# Patient Record
Sex: Female | Born: 1940 | Race: White | Hispanic: No | Smoking: Never smoker
Health system: Southern US, Community
[De-identification: ages and names within clinical notes are randomized; demographics above are authoritative.]

## PROBLEM LIST (undated history)

## (undated) DIAGNOSIS — I10 Essential (primary) hypertension: Secondary | ICD-10-CM

## (undated) DIAGNOSIS — F329 Major depressive disorder, single episode, unspecified: Secondary | ICD-10-CM

## (undated) DIAGNOSIS — R053 Chronic cough: Secondary | ICD-10-CM

## (undated) DIAGNOSIS — K219 Gastro-esophageal reflux disease without esophagitis: Secondary | ICD-10-CM

## (undated) DIAGNOSIS — F419 Anxiety disorder, unspecified: Secondary | ICD-10-CM

## (undated) DIAGNOSIS — T8859XA Other complications of anesthesia, initial encounter: Secondary | ICD-10-CM

## (undated) DIAGNOSIS — N1832 Chronic kidney disease, stage 3b: Secondary | ICD-10-CM

## (undated) DIAGNOSIS — Z9989 Dependence on other enabling machines and devices: Secondary | ICD-10-CM

## (undated) DIAGNOSIS — Z974 Presence of external hearing-aid: Secondary | ICD-10-CM

## (undated) DIAGNOSIS — R0602 Shortness of breath: Secondary | ICD-10-CM

## (undated) DIAGNOSIS — F32A Depression, unspecified: Secondary | ICD-10-CM

## (undated) DIAGNOSIS — U071 COVID-19: Secondary | ICD-10-CM

## (undated) DIAGNOSIS — I82409 Acute embolism and thrombosis of unspecified deep veins of unspecified lower extremity: Secondary | ICD-10-CM

## (undated) HISTORY — DX: Major depressive disorder, single episode, unspecified: F32.9

## (undated) HISTORY — DX: Essential (primary) hypertension: I10

## (undated) HISTORY — PX: PARATHYROIDECTOMY: SHX19

## (undated) HISTORY — DX: Anxiety disorder, unspecified: F41.9

## (undated) HISTORY — PX: APPENDECTOMY: SHX54

## (undated) HISTORY — DX: Depression, unspecified: F32.A

---

## 1998-01-13 ENCOUNTER — Ambulatory Visit (HOSPITAL_COMMUNITY): Admission: RE | Admit: 1998-01-13 | Discharge: 1998-01-13 | Payer: Self-pay | Admitting: *Deleted

## 1998-01-20 ENCOUNTER — Ambulatory Visit (HOSPITAL_COMMUNITY): Admission: RE | Admit: 1998-01-20 | Discharge: 1998-01-20 | Payer: Self-pay | Admitting: *Deleted

## 1999-02-06 ENCOUNTER — Ambulatory Visit (HOSPITAL_COMMUNITY): Admission: RE | Admit: 1999-02-06 | Discharge: 1999-02-06 | Payer: Self-pay | Admitting: *Deleted

## 1999-02-06 ENCOUNTER — Encounter: Payer: Self-pay | Admitting: Unknown Physician Specialty

## 1999-05-03 ENCOUNTER — Encounter: Admission: RE | Admit: 1999-05-03 | Discharge: 1999-06-21 | Payer: Self-pay | Admitting: Unknown Physician Specialty

## 1999-05-03 ENCOUNTER — Encounter: Payer: Self-pay | Admitting: Unknown Physician Specialty

## 1999-05-03 ENCOUNTER — Encounter: Admission: RE | Admit: 1999-05-03 | Discharge: 1999-05-03 | Payer: Self-pay | Admitting: Unknown Physician Specialty

## 1999-07-12 ENCOUNTER — Encounter: Admission: RE | Admit: 1999-07-12 | Discharge: 1999-07-12 | Payer: Self-pay | Admitting: Unknown Physician Specialty

## 2000-02-13 ENCOUNTER — Encounter: Payer: Self-pay | Admitting: Unknown Physician Specialty

## 2000-02-13 ENCOUNTER — Ambulatory Visit (HOSPITAL_COMMUNITY): Admission: RE | Admit: 2000-02-13 | Discharge: 2000-02-13 | Payer: Self-pay | Admitting: Unknown Physician Specialty

## 2001-04-15 ENCOUNTER — Encounter: Payer: Self-pay | Admitting: Unknown Physician Specialty

## 2001-04-15 ENCOUNTER — Ambulatory Visit (HOSPITAL_COMMUNITY): Admission: RE | Admit: 2001-04-15 | Discharge: 2001-04-15 | Payer: Self-pay | Admitting: Unknown Physician Specialty

## 2001-11-13 ENCOUNTER — Emergency Department (HOSPITAL_COMMUNITY): Admission: EM | Admit: 2001-11-13 | Discharge: 2001-11-13 | Payer: Self-pay | Admitting: Emergency Medicine

## 2001-11-13 ENCOUNTER — Encounter: Payer: Self-pay | Admitting: Emergency Medicine

## 2002-04-28 ENCOUNTER — Ambulatory Visit (HOSPITAL_COMMUNITY): Admission: RE | Admit: 2002-04-28 | Discharge: 2002-04-28 | Payer: Self-pay | Admitting: Unknown Physician Specialty

## 2002-04-28 ENCOUNTER — Encounter: Payer: Self-pay | Admitting: Unknown Physician Specialty

## 2003-10-05 ENCOUNTER — Ambulatory Visit (HOSPITAL_COMMUNITY): Admission: RE | Admit: 2003-10-05 | Discharge: 2003-10-05 | Payer: Self-pay | Admitting: Unknown Physician Specialty

## 2003-12-30 ENCOUNTER — Emergency Department (HOSPITAL_COMMUNITY): Admission: EM | Admit: 2003-12-30 | Discharge: 2003-12-30 | Payer: Self-pay | Admitting: Family Medicine

## 2004-06-13 ENCOUNTER — Emergency Department: Payer: Self-pay | Admitting: Emergency Medicine

## 2004-10-24 ENCOUNTER — Ambulatory Visit (HOSPITAL_COMMUNITY): Admission: RE | Admit: 2004-10-24 | Discharge: 2004-10-24 | Payer: Self-pay | Admitting: Unknown Physician Specialty

## 2005-04-18 ENCOUNTER — Ambulatory Visit: Payer: Self-pay | Admitting: Unknown Physician Specialty

## 2006-02-05 ENCOUNTER — Ambulatory Visit: Payer: Self-pay | Admitting: Unknown Physician Specialty

## 2007-02-12 ENCOUNTER — Ambulatory Visit: Payer: Self-pay | Admitting: Unknown Physician Specialty

## 2007-06-23 ENCOUNTER — Ambulatory Visit: Payer: Self-pay | Admitting: Psychiatry

## 2007-10-01 ENCOUNTER — Ambulatory Visit: Payer: Self-pay | Admitting: Psychiatry

## 2007-10-15 ENCOUNTER — Ambulatory Visit: Payer: Self-pay | Admitting: Psychiatry

## 2008-02-13 ENCOUNTER — Ambulatory Visit: Payer: Self-pay | Admitting: Unknown Physician Specialty

## 2009-09-06 ENCOUNTER — Emergency Department: Payer: Self-pay | Admitting: Emergency Medicine

## 2009-09-07 ENCOUNTER — Ambulatory Visit: Payer: Self-pay | Admitting: Emergency Medicine

## 2010-01-24 ENCOUNTER — Emergency Department: Payer: Self-pay | Admitting: Emergency Medicine

## 2010-04-12 ENCOUNTER — Ambulatory Visit: Payer: Self-pay | Admitting: Unknown Physician Specialty

## 2010-08-07 ENCOUNTER — Ambulatory Visit: Payer: Self-pay | Admitting: Unknown Physician Specialty

## 2010-08-09 LAB — PATHOLOGY REPORT

## 2010-08-30 ENCOUNTER — Ambulatory Visit: Payer: Self-pay | Admitting: Psychiatry

## 2011-10-01 ENCOUNTER — Ambulatory Visit: Payer: Self-pay | Admitting: Unknown Physician Specialty

## 2012-01-14 ENCOUNTER — Emergency Department: Payer: Self-pay | Admitting: Emergency Medicine

## 2012-02-01 ENCOUNTER — Ambulatory Visit: Payer: Self-pay | Admitting: Internal Medicine

## 2012-03-19 ENCOUNTER — Encounter: Payer: Self-pay | Admitting: Physician Assistant

## 2012-04-08 ENCOUNTER — Encounter: Payer: Self-pay | Admitting: Physician Assistant

## 2012-05-09 ENCOUNTER — Encounter: Payer: Self-pay | Admitting: Physician Assistant

## 2012-11-11 ENCOUNTER — Inpatient Hospital Stay: Payer: Self-pay | Admitting: Psychiatry

## 2012-11-11 DIAGNOSIS — Z01810 Encounter for preprocedural cardiovascular examination: Secondary | ICD-10-CM

## 2012-11-11 LAB — BEHAVIORAL MEDICINE 1 PANEL
Anion Gap: 6 — ABNORMAL LOW (ref 7–16)
BUN: 18 mg/dL (ref 7–18)
Chloride: 108 mmol/L — ABNORMAL HIGH (ref 98–107)
Co2: 26 mmol/L (ref 21–32)
EGFR (African American): 53 — ABNORMAL LOW
EGFR (Non-African Amer.): 46 — ABNORMAL LOW
Eosinophil %: 1.7 %
Glucose: 93 mg/dL (ref 65–99)
HCT: 40.7 % (ref 35.0–47.0)
Lymphocyte #: 1.9 10*3/uL (ref 1.0–3.6)
MCH: 31.9 pg (ref 26.0–34.0)
Monocyte %: 11.8 %
Neutrophil %: 58.6 %
Osmolality: 281 (ref 275–301)
Potassium: 4.5 mmol/L (ref 3.5–5.1)
RBC: 4.39 10*6/uL (ref 3.80–5.20)

## 2012-11-12 ENCOUNTER — Ambulatory Visit: Payer: Self-pay | Admitting: Psychiatry

## 2012-11-12 LAB — URINALYSIS, COMPLETE
Blood: NEGATIVE
Ketone: NEGATIVE
Nitrite: NEGATIVE
Ph: 5 (ref 4.5–8.0)
Specific Gravity: 1.008 (ref 1.003–1.030)

## 2012-11-24 ENCOUNTER — Emergency Department: Payer: Self-pay | Admitting: Emergency Medicine

## 2012-11-24 LAB — ETHANOL
Ethanol %: <0.003 % (ref 0.000–0.080)
Ethanol: <3 mg/dL

## 2012-11-24 LAB — CBC
HCT: 45 % (ref 35.0–47.0)
HGB: 15.1 g/dL (ref 12.0–16.0)
MCH: 31.5 pg (ref 26.0–34.0)
MCHC: 33.6 g/dL (ref 32.0–36.0)
MCV: 94 fL (ref 80–100)
RDW: 14.6 % — ABNORMAL HIGH (ref 11.5–14.5)
WBC: 9.1 10*3/uL (ref 3.6–11.0)

## 2012-11-24 LAB — DRUG SCREEN, URINE
Amphetamines, Ur Screen: NEGATIVE (ref ?–1000)
Barbiturates, Ur Screen: NEGATIVE (ref ?–200)
Cannabinoid 50 Ng, Ur ~~LOC~~: NEGATIVE (ref ?–50)
Cocaine Metabolite,Ur ~~LOC~~: NEGATIVE (ref ?–300)
Methadone, Ur Screen: NEGATIVE (ref ?–300)
Opiate, Ur Screen: NEGATIVE (ref ?–300)

## 2012-11-24 LAB — URINALYSIS, COMPLETE
Bilirubin,UR: NEGATIVE
Ph: 5 (ref 4.5–8.0)
Protein: NEGATIVE
Squamous Epithelial: 3
WBC UR: 3 /HPF (ref 0–5)

## 2012-11-24 LAB — COMPREHENSIVE METABOLIC PANEL
Anion Gap: 12 (ref 7–16)
BUN: 18 mg/dL (ref 7–18)
Bilirubin,Total: 0.4 mg/dL (ref 0.2–1.0)
EGFR (African American): 56 — ABNORMAL LOW
EGFR (Non-African Amer.): 48 — ABNORMAL LOW
Osmolality: 280 (ref 275–301)
Potassium: 3.7 mmol/L (ref 3.5–5.1)
SGPT (ALT): 70 U/L (ref 12–78)
Total Protein: 7.5 g/dL (ref 6.4–8.2)

## 2012-12-07 ENCOUNTER — Ambulatory Visit: Payer: Self-pay | Admitting: Psychiatry

## 2013-03-04 ENCOUNTER — Ambulatory Visit: Payer: Self-pay | Admitting: Psychiatry

## 2014-07-15 ENCOUNTER — Ambulatory Visit: Payer: Self-pay | Admitting: Physical Medicine and Rehabilitation

## 2014-10-22 ENCOUNTER — Other Ambulatory Visit: Payer: Self-pay | Admitting: Specialist

## 2014-10-22 DIAGNOSIS — R918 Other nonspecific abnormal finding of lung field: Secondary | ICD-10-CM

## 2014-10-29 NOTE — Discharge Summary (Signed)
PATIENT NAME:  Kara Pennington, Kara S MR#:  [id number] DATE OF BIRTH:  1940/08/07  DATE OF ADMISSION:  11/11/2012 DATE OF DISCHARGE:  11/12/2012  HOSPITAL COURSE: See dictated history and physical for details of admission. This 74 year old woman was admitted to the hospital for planned overnight stay to initiate electroconvulsive therapy. The patient had a history of major depression with progressively poor functioning outside the hospital. In the hospital, she did not engage in any dangerous or aggressive behavior. She was cooperative with treatment. The lab tests were obtained for initiation of ECT without any difficulty. ECT was begun on the morning of May 7 and she received a right unilateral treatment, which she tolerated well without any difficulties or complications. Recovered well. Did not have any complaints other than mild transient headache. The patient was discharged home in the company of her children with a plan to return for continued index course of ECT for the following Friday and will continue follow-up psychiatric care with her outpatient psychiatrist, otherwise.   MENTAL STATUS EXAMINATION AT DISCHARGE: Neatly dressed and groomed woman, looks her stated age, cooperative with the interview. Good eye contact, normal psychomotor activity. Speech normal in rate, tone and volume. Affect euthymic, reactive and appropriate. Mood stated as being okay. Thoughts appear to be lucid. No evidence of loosening of associations or delusions. Denies auditory or visual hallucinations. Denies suicidal or homicidal ideation. Shows good judgment and insight. No obvious changes to memory.   LABORATORY RESULTS: EKG shows sinus rhythm with some variation, possible left atrial enlargement. No sign of ischemia.   Chest x-ray unremarkable.   CBC normal.  Chemistry panel showed only minor abnormality with elevated chloride at 108, AST at 58, albumin 3.2.   Urinalysis unremarkable.   DISCHARGE MEDICATIONS: Fetzima  40 mg once a day, Sonata 10 mg once at night, oxycodone/acetaminophen 5/325, 1 tablet q. 4 hours p.r.n. for pain.   DISPOSITION: Discharge home, as noted to her home. She will be accompanied by her children and she has her children to check up on her and friends who will apparently assist her with outpatient ECT.   DIAGNOSIS, PRINCIPAL AND PRIMARY:  AXIS I: Major depression, severe, recurrent.   SECONDARY DIAGNOSES: AXIS I: No further.   AXIS II: No diagnosis.   AXIS III: Chronic pain.   AXIS IV: Moderate to severe from burden of illness.   AXIS V: Functioning at time of discharge 55.  ____________________________ Audery AmelJohn T. Donivin Wirt, MD jtc:cc D: 11/16/2012 22:51:50 ET T: 11/17/2012 12:19:26 ET JOB#: [id number]  cc: Audery AmelJohn T. Aashna Matson, MD, <Dictator> Audery AmelJOHN T Verdie Wilms MD ELECTRONICALLY SIGNED 11/17/2012 17:36

## 2014-10-29 NOTE — Consult Note (Signed)
PATIENT NAME:  Kara Pennington, Kara Pennington MR#:  [id number] DATE OF BIRTH:  Apr 28, 1941  DATE OF CONSULTATION:  11/25/2012  PSYCHIATRIC CONSULT  REFERRING PHYSICIAN:   Maricela Bo, MD CONSULTING PHYSICIAN:  Ardeen Fillers. Garnetta Buddy, MD  REASON FOR CONSULTATION:  "I was acting spacey."  HISTORY OF PRESENT ILLNESS: The patient is a 74 year old Caucasian white female who was brought to the ED with her daughter for having confusion. The patient has a long history of depression, memory impairment and she was looking spacey. She was accompanied by her family members, including her daughter and her son. Her daughter reported that the patient had 5 ECT treatments and she was supposed to have one yesterday morning, but she forgot and when her son went to get her for the treatment, she actually drank some orange juice. The patient reported that she forgot about the treatment and has been sleeping a lot. The daughter reported that patient has a delayed reaction to things and she appeared confused with poor judgment. The daughter has been asking the patient not to drive on Saturday and then she got into her car and drove to see the grandson play basketball. The patient has been making excuses for her impaired memory.   The patient has started her ECT treatment recently and has received 4 treatments so far and she is currently under the treatment of Dr. Toni Amend and getting outpatient ECT treatments. During my interview, the patient was lying in the bed and she appeared calm and cooperative. She reported that she has long history of depression and she has noticed some improvement in her depressive symptoms at this time. She reported that she has noticed some amnesia related to the ECT treatments and her family is more concerned, as she forgot her 5th ECT treatment. Reported that she is not having any other adverse effects besides having some short-term amnesia. She denied having any auditory or visual hallucinations. Denied having any  suicidal ideations or plans.   PAST PSYCHIATRIC HISTORY: The patient reported that she is currently under the treatment of Dr. Imogene Burn for the past 4 to 5 years. Dr. Toni Amend is doing ECT on her at this time. Reported that she is compliant with her medications, as well as with her treatment. Reported that she is taking Viibyrid 10 mg at this time.   SOCIAL HISTORY: The patient currently lives by herself. She stated that she also takes zaleplon 10 mg to help with her sleep.   ALLERGIES:  No known drug allergies.   MEDICAL HISTORY:  Hypertension.  REVIEW OF SYSTEMS:  CONSTITUTIONAL:  No fever or chills. No weight changes. Denies any blurred vision.  RESPIRATORY:  No shortness of breath or cough.  CARDIOVASCULAR:  Denies any chest pain or orthopnea.  GASTROINTESTINAL:  No abdominal pain, nausea, vomiting or diarrhea.  ENDOCRINE:  No heat or cold intolerance. LYMPHATIC:  No anemia or easy bruising.  INTEGUMENTARY:  No acne or rash.  MUSCULOSKELETAL:  Denies any muscle or joint pain.  VITAL SIGNS: Temperature 98.3, pulse 84, respirations 18, blood pressure 163/89.  Glucose 111, BUN 18, creatinine 1.13, sodium 139, potassium 3.7, chloride 109, bicarbonate 18, anion gap 12, osmolality 280. Blood alcohol level less than 3.0. Protein 7.5, albumin 3.5, bilirubin 0.4, alkaline phosphatase 125, AST 66, ALT 70. Urine drug screen was negative. TSH 4.87. WBC 9.1, RBC 84.81, hemoglobin 15.1, hematocrit 45, platelet count 197, MCV 94, RDW 14.6.   MENTAL STATUS:  The patient is an older-looking female who appeared her stated age.  She was calm and cooperative. She maintained fair eye contact. Her mood was fine. Affect was congruent. Thought process logical, goal-directed. Thought content was nondelusional. She appeared somewhat depressed, but denied having any suicidal ideation or plan. She demonstrated fair insight and judgment.   DIAGNOSTIC IMPRESSION: AXIS I:  Major depressive disorder, recurrent, severe  without psychotic features.   AXIS II:  None.   AXIS III:  Hypertension.   TREATMENT PLAN:  The patient will be discharged from the Emergency Room as she does not have any suicidal ideations or plan. She contracted for safety. She will return back to her home with her family members and she will continue with her ECT treatment as advised by Dr. Toni Amendlapacs. The patient agreed with the plan. No new medications will be prescribed for her at this time. I advised patient to return to the Emergency Room if she noticed worsening of her symptoms and she demonstrated understanding.  Thank you for allowing me to participate in the care of this patient.     ____________________________ Ardeen FillersUzma S. Garnetta BuddyFaheem, MD usf:ce D: 11/25/2012 13:17:50 ET T: 11/25/2012 13:33:21 ET JOB#: [id number]  cc: Ardeen FillersUzma S. Garnetta BuddyFaheem, MD, <Dictator> Rhunette CroftUZMA S Jakim Drapeau MD ELECTRONICALLY SIGNED 11/25/2012 16:41

## 2014-10-29 NOTE — H&P (Signed)
PATIENT NAME:  Kara Pennington, Kara Pennington MR#:  [id number] DATE OF BIRTH:  10/11/1940  DATE OF ADMISSION:  11/11/2012  IDENTIFYING INFORMATION AND CHIEF COMPLAINT: The patient is a 74 year old woman with a history of major depression admitted voluntarily for ECT.   CHIEF COMPLAINT: "I just want to get better."   HISTORY OF PRESENT ILLNESS: Information obtained from the patient and some old records from her primary care doctor. The patient reports that she has been having major depression episodes that have been going on for years, but have been particularly bad for the last couple years. She currently complains of sad mood most of the time. She has lost interest in almost all of her normal life activities. Her energy level feels low. She often does not feel like getting out of bed at all. She has frequent crying spells. She sleeps poorly at night. She has had some weight loss. At times she has had passive suicidal thoughts with thoughts of wishing that she would just die. She has not actually acted on them. She has not had any hallucinations. She is compliant with her current psychiatric medicine. She has been taking her current dose of antidepressant medicine for a couple of months without significant improvement. She currently is an outpatient seeing Dr. [place]velene [place]Kaur in [place].   PAST PSYCHIATRIC HISTORY: No previous psychiatric hospitalizations. No history of prior suicide attempts or psychotic episodes. No history of manic episodes. Long history of depression going back years intermittently. Has been on multiple medications including multiple serotonin reuptake inhibitors and tricyclic antidepressants without significant benefit. Has also been on antipsychotic medicines as boosters without significant lasting benefit. In the past, she had some improvement with Zoloft but that no longer worked and her current antidepressant has been only partially helpful.   PAST MEDICAL HISTORY: The patient has a very benign past  medical history with no significant ongoing medical problems. No history of high blood pressure. No history of heart disease. No history of strokes or seizures or intracranial lesions.   SOCIAL HISTORY: The patient lives alone, but has multiple supportive family members including at least 2 supportive children who live in the area. She is retired. At her previous baseline, she was very socially active. Does not report any immediate clear stress related to her family.   SUBSTANCE ABUSE HISTORY: Drinks very infrequently. No history of abuse of drinking. No history of any drug abuse.   MEDICATIONS:  1.  Fetzima 40 mg p.o. at bedtime. 2.  Sonata 10 mg p.o. at bedtime.   ALLERGIES: No known drug allergies.   REVIEW OF SYSTEMS: As noted above, constant depressed mood, low interest in normal activities, low energy, passive suicidal ideation, poor sleep most nights, hopelessness, crying spells. No hallucinations or delusions.   MENTAL STATUS EXAMINATION: Neatly dressed and groomed woman who looks her stated age. Cooperative with the interview. Good eye contact. Slow psychomotor activity. Slow and quiet speech. Affect blunted. Mood stated as depressed. Thoughts appear to generally be lucid. No obvious loosening of associations or delusions. Denies auditory or visual hallucinations. Passive suicidal thoughts with hopelessness, but no intention of acting on it. Is able to identify positive things in her life including her children and grandchildren not to kill herself for. No homicidal ideation. No evidence of thought disorder. Normal intelligence, reasonably good insight and judgment.   PHYSICAL EXAMINATION: GENERAL: The patient appears in no acute distress.  SKIN: No acute skin lesions identified.  HEENT: Pupils equal and reactive. Face symmetric. Oral mucosa normal.  BACK AND NECK: Nontender. MUSCULOSKELETAL: Full range of motion at all extremities.  LUNGS: Clear without wheezes.  HEART: Regular rate  and rhythm.  ABDOMEN: Soft, nontender.  Normal bowel sounds.  NEUROLOGIC:  Strength and reflexes normal and symmetric throughout. Cranial nerves symmetric and normal.  VITAL SIGNS: Not yet done and will be entered when they are.   LABORATORY RESULTS: Labs are not yet done and will be entered when they are.   ASSESSMENT: A 74 year old woman admitted voluntarily for ECT. The patient has severe depression without response to medicine. She lives on her own. She has had some passive suicidal ideation. Additionally, she is at elevated risk for delirium and confusion related to ECT treatment. Because of this it is prudent to admit her to the hospital at least overnight to begin ECT. Plan is to begin ECT voluntarily tomorrow. Full lab studies will be done in the meantime.  No new medication changes. Full evaluation pending by the ECT nurse. We will plan to do right unilateral treatment with treatment to begin tomorrow morning, Wednesday, the 7th.  DIAGNOSIS, PRINCIPAL AND PRIMARY:   AXIS I: Major depression, severe, recurrent.   SECONDARY DIAGNOSES:  AXIS I: No further.   AXIS II: Deferred.   AXIS III: No diagnosis.   AXIS IV: Moderate to severe from social isolation.   AXIS V: Functioning at time of evaluation 40.  ____________________________ Audery Amel, MD jtc:sb D: 11/11/2012 13:47:57 ET T: 11/11/2012 13:58:17 ET JOB#: [id number]  cc: Audery Amel, MD, <Dictator> Audery Amel MD ELECTRONICALLY SIGNED 11/12/2012 19:44

## 2014-11-18 ENCOUNTER — Ambulatory Visit
Admission: RE | Admit: 2014-11-18 | Discharge: 2014-11-18 | Disposition: A | Discharge status: Home / Self Care | Payer: Medicare Other | Source: Ambulatory Visit | Attending: Specialist | Admitting: Specialist

## 2014-11-18 DIAGNOSIS — R918 Other nonspecific abnormal finding of lung field: Secondary | ICD-10-CM

## 2014-11-18 DIAGNOSIS — R911 Solitary pulmonary nodule: Secondary | ICD-10-CM | POA: Insufficient documentation

## 2014-12-08 ENCOUNTER — Ambulatory Visit
Admission: RE | Admit: 2014-12-08 | Discharge: 2014-12-08 | Disposition: A | Discharge status: Home / Self Care | Payer: Medicare Other | Source: Ambulatory Visit | Attending: Cardiology | Admitting: Cardiology

## 2014-12-08 ENCOUNTER — Other Ambulatory Visit: Payer: Self-pay | Admitting: Cardiology

## 2014-12-08 DIAGNOSIS — M7989 Other specified soft tissue disorders: Secondary | ICD-10-CM | POA: Insufficient documentation

## 2014-12-27 ENCOUNTER — Ambulatory Visit: Payer: Medicare Other | Attending: Physician Assistant | Admitting: Physical Therapy

## 2014-12-27 ENCOUNTER — Encounter: Payer: Self-pay | Admitting: Physical Therapy

## 2014-12-27 DIAGNOSIS — M5136 Other intervertebral disc degeneration, lumbar region: Secondary | ICD-10-CM | POA: Diagnosis present

## 2014-12-27 DIAGNOSIS — R531 Weakness: Secondary | ICD-10-CM

## 2014-12-27 DIAGNOSIS — M51369 Other intervertebral disc degeneration, lumbar region without mention of lumbar back pain or lower extremity pain: Secondary | ICD-10-CM

## 2014-12-27 DIAGNOSIS — R0609 Other forms of dyspnea: Secondary | ICD-10-CM

## 2014-12-27 NOTE — Therapy (Signed)
[facility] [address] [place], [address] Phone: [phone]   Fax:  [phone]  Physical Therapy Evaluation  Patient Details  Name: Kara DERUYTER MRN: [id number] Date of Birth: 01-03-1941 Referring Provider:  Patrice Paradise, MD  Encounter Date: 12/27/2014      PT End of Session - 12/27/14 1708    Visit Number 1   Number of Visits 13   Date for PT Re-Evaluation 02/07/15   Authorization Type 1   Authorization Time Period 10   PT Start Time 1300   PT Stop Time 1345   PT Time Calculation (min) 45 min   Activity Tolerance Patient tolerated treatment well;No increased pain;Patient limited by fatigue   Behavior During Therapy [facility] for tasks assessed/performed      Past Medical History  Diagnosis Date  . Anxiety   . Depression   . Hypertension     No past surgical history on file.  Filed Vitals:   12/27/14 1403  SpO2: 97%    Visit Diagnosis:  Dyspnea on exertion  General weakness  DDD (degenerative disc disease), lumbar      Subjective Assessment - 12/27/14 1403    Subjective Patient reports sob on exertion as well as back pain. Reports mainly stiffness in the morning.    Limitations Walking;Standing   How long can you stand comfortably? not longer than 1 hour   How long can you walk comfortably? <5 min   Patient Stated Goals TO increase endurance, lose some weight.   Currently in Pain? Yes   Pain Score 4    Pain Location Back   Pain Descriptors / Indicators Discomfort;Dull;Tightness   Pain Type Chronic pain   Pain Onset More than a month ago   Pain Frequency Intermittent   Multiple Pain Sites No            [facility] PT Assessment - 12/27/14 0001    Assessment   Medical Diagnosis general weakness, DDD, dyspena on exertion   Prior Therapy yes   Balance Screen   Has the patient fallen in the past 6 months No   Has the patient had a decrease in activity level because of a  fear of falling?  Yes   Is the patient reluctant to leave their home because of a fear of falling?  No   Home [employer] residence   Living Arrangements Alone   Prior Function   Level of Independence Independent   Vocation Retired   Leisure walking   Cognition   Overall Cognitive Status Within Functional Limits for tasks assessed   Observation/Other Assessments   Modified [place] 24%   ROM / Strength   AROM / PROM / Strength AROM;PROM;Strength   AROM   Overall AROM  Within functional limits for tasks performed   PROM   Overall PROM  Within functional limits for tasks performed   Strength   Overall Strength Within functional limits for tasks performed   6 Minute Walk- Baseline   6 Minute Walk- Baseline yes   02 Sat (%RA) 97 %   Modified Borg Scale for Dyspnea 7- Severe shortness of breath or very hard breathing   6 minute walk test results    Aerobic Endurance Distance Walked 600   Endurance additional comments only able to walk for 4 minutes       Treatment this session: Patient performed exercises to reduce low back stiffness in the morning.  Lumbar rotations x 10 Bridging x10 SKTC x 10 each.       PT Education - 12/27/14 1707    Education provided Yes   Education Details home exercises for lumbar spine   Person(s) Educated Patient   Methods Explanation;Demonstration   Comprehension Returned demonstration;Verbalized understanding             PT Long Term Goals - 12/27/14 1815    PT LONG TERM GOAL #1   Title Patient will be independent with HEP for improved mobility, endurance   Baseline lacking knowledge of exercises and how to improve endurance.    Time 4   Period Weeks   Status New   PT LONG TERM GOAL #2   Title Patient will improve disability demonstrated by improved modified oswestry score of < 10%   Baseline 26%   Time 6   Period Weeks   Status New   PT LONG TERM GOAL #3   Title Patient will demonstrate improved  endurance by completing the 6 MW test and walking more than 800'.   Baseline walked 600' in 4 min.    Time 6   Period Weeks   Status New               Plan - 12/27/14 1709    Clinical Impression Statement Patient is a 74 year old female reporting decreased activity tolerance. Pt reports dyspnea on exertion, also limited by low back pain.    Pt will benefit from skilled therapeutic intervention in order to improve on the following deficits Cardiopulmonary status limiting activity;Decreased activity tolerance;Decreased endurance;Decreased mobility;Decreased strength;Decreased balance   Rehab Potential Good   PT Frequency 2x / week   PT Duration 6 weeks   PT Treatment/Interventions Therapeutic exercise;Therapeutic activities;Balance training;Patient/family education;Aquatic Therapy   Consulted and Agree with Plan of Care Patient         Problem List There are no active problems to display for this patient.   Yemariam Magar, PT  12/27/2014, 6:21 PM  [facility] [address], [place], [address] Phone: [phone]   Fax:  [phone]

## 2014-12-28 ENCOUNTER — Ambulatory Visit: Payer: Medicare Other | Attending: Physician Assistant | Admitting: Physical Therapy

## 2014-12-28 DIAGNOSIS — M5136 Other intervertebral disc degeneration, lumbar region: Secondary | ICD-10-CM

## 2014-12-28 DIAGNOSIS — R531 Weakness: Secondary | ICD-10-CM

## 2014-12-28 DIAGNOSIS — M51369 Other intervertebral disc degeneration, lumbar region without mention of lumbar back pain or lower extremity pain: Secondary | ICD-10-CM

## 2014-12-28 DIAGNOSIS — R0609 Other forms of dyspnea: Secondary | ICD-10-CM

## 2014-12-28 NOTE — Therapy (Signed)
[facility] [address] [place], [address] Phone: [phone]   Fax:  [phone]  Physical Therapy Treatment  Patient Details  Name: Kara Pennington MRN: [id number] Date of Birth: [birth year] Referring Provider:  Patrice Paradise, MD  Encounter Date: 12/28/2014      PT End of Session - 12/28/14 1221    Visit Number 2   Number of Visits 13   Date for PT Re-Evaluation 02/07/15   Authorization Type 2   Authorization Time Period 10   PT Start Time 1140   PT Stop Time 1215   PT Time Calculation (min) 35 min   Activity Tolerance Patient tolerated treatment well;No increased pain   Behavior During Therapy [facility] for tasks assessed/performed      Past Medical History  Diagnosis Date  . Anxiety   . Depression   . Hypertension     No past surgical history on file.  There were no vitals filed for this visit.  Visit Diagnosis:  Dyspnea on exertion  General weakness  DDD (degenerative disc disease), lumbar      Subjective Assessment - 12/28/14 1219    Subjective Patient is SOB upon arrival after rushing over here. Pt arrived 10 min late due to oversleeping.    Limitations Walking;House hold activities   How long can you stand comfortably? not longer than 1 hour   How long can you walk comfortably? <5 min   Patient Stated Goals TO increase endurance, lose some weight.   Currently in Pain? No/denies                     Adult Aquatic Therapy - 12/28/14 1220    Aquatic Therapy Subjective   Subjective Patient arrived late due to oversleeping. Pt reports sob, no back pain reported.    Treatment   Gait Pt ambulated forward x6 laps, sidestepping x 2 laps, backward x 2 laps.   Exercises Standing exercises to include: marching, squats, hip abduction and heel raises x 2 min each.                    PT Education - 12/28/14 1221    Education provided Yes   Education Details  aquatic exercises, posture   Person(s) Educated Patient   Methods Explanation;Verbal cues   Comprehension Verbalized understanding;Returned demonstration             PT Long Term Goals - 12/27/14 1815    PT LONG TERM GOAL #1   Title Patient will be independent with HEP for improved mobility, endurance   Baseline lacking knowledge of exercises and how to improve endurance.    Time 4   Period Weeks   Status New   PT LONG TERM GOAL #2   Title Patient will improve disability demonstrated by improved modified oswestry score of < 10%   Baseline 26%   Time 6   Period Weeks   Status New   PT LONG TERM GOAL #3   Title Patient will demonstrate improved endurance by completing the 6 MW test and walking more than 800'.   Baseline walked 600' in 4 min.    Time 6   Period Weeks   Status New               Plan - 12/28/14 1222    Clinical Impression Statement Patient tolerated aquatic activities well without signs or symptoms of SOB on exertion.  Pt will benefit from skilled therapeutic intervention in order to improve on the following deficits Cardiopulmonary status limiting activity;Decreased activity tolerance;Decreased endurance;Decreased mobility;Decreased strength;Decreased balance   Rehab Potential Good   PT Frequency 2x / week   PT Duration 6 weeks   PT Treatment/Interventions Therapeutic exercise;Therapeutic activities;Balance training;Patient/family education;Aquatic Therapy   Consulted and Agree with Plan of Care Patient          G-Codes - 12-30-2014 1824    Functional Assessment Tool Used 6 min walk, TUG, Oswestry, clinical judgement   Functional Limitation Mobility: Walking and moving around   Mobility: Walking and Moving Around Current Status [phone]) At least 40 percent but less than 60 percent impaired, limited or restricted   Mobility: Walking and Moving Around Goal Status [phone]) At least 20 percent but less than 40 percent impaired, limited or restricted       Problem List There are no active problems to display for this patient.   Marquavious Nazar, PT 12/28/2014, 12:23 PM  [facility] [address] [place], [address] Phone: [phone]   Fax:  [phone]

## 2014-12-30 ENCOUNTER — Encounter: Payer: Medicare Other | Admitting: Physical Therapy

## 2015-01-04 ENCOUNTER — Ambulatory Visit: Payer: Medicare Other | Admitting: Physical Therapy

## 2015-01-04 ENCOUNTER — Encounter: Payer: Medicare Other | Admitting: Physical Therapy

## 2015-01-07 ENCOUNTER — Encounter: Payer: Medicare Other | Admitting: Physical Therapy

## 2015-01-07 ENCOUNTER — Ambulatory Visit: Payer: Medicare Other | Attending: Physician Assistant | Admitting: Physical Therapy

## 2015-01-07 DIAGNOSIS — R0609 Other forms of dyspnea: Secondary | ICD-10-CM | POA: Diagnosis present

## 2015-01-07 DIAGNOSIS — M51369 Other intervertebral disc degeneration, lumbar region without mention of lumbar back pain or lower extremity pain: Secondary | ICD-10-CM

## 2015-01-07 DIAGNOSIS — R531 Weakness: Secondary | ICD-10-CM | POA: Diagnosis present

## 2015-01-07 DIAGNOSIS — M5136 Other intervertebral disc degeneration, lumbar region: Secondary | ICD-10-CM | POA: Diagnosis present

## 2015-01-07 NOTE — Therapy (Signed)
[facility] [facility]ICINE [address] , [place], [address] Phone: [phone]   Fax:  [phone]  Physical Therapy Treatment  Patient Details  Name: Kara Pennington MRN: [id number] Date of Birth: [birth year] Referring Provider:  Patrice ParadiseMcLaughlin, Miriam K, MD  Encounter Date: 01/07/2015      PT End of Session - 01/07/15 1134    Visit Number 3   Number of Visits 13   Date for PT Re-Evaluation 02/07/15   Authorization Type 3   Authorization Time Period 10   PT Start Time 1110   PT Stop Time 1145   PT Time Calculation (min) 35 min   Activity Tolerance Patient tolerated treatment well;No increased pain;Patient limited by fatigue   Behavior [facility] for tasks assessed/performed      Past Medical History  Diagnosis Date  . Anxiety   . Depression   . Hypertension     No past surgical history on file.  There were no vitals filed for this visit.  Visit Diagnosis:  Dyspnea on exertion  General weakness  DDD (degenerative disc disease), lumbar      Subjective Assessment - 01/07/15 1111    Subjective Patient reports she feels "stiff as a board" this morning. She enters the clinic ~ 6-7 minutes late and breathing heavily.    Limitations Walking;House hold activities   How long can you stand comfortably? not longer than 1 hour   How long can you walk comfortably? <5 min   Patient Stated Goals TO increase endurance, lose some weight.   Currently in Pain? No/denies  Stiffness only.        There-ex  99% O2, 85 bpm upon entering.   NuStep level 4 for 5 minutes  Sit to stand with bilateral UE support x 5, 8, 10 Standing calf raises x 15 for 2 sets  Standing hip flexion marching x 16 total (8 per side with 3 second holds)  Standing hip abductions x 1 minute bilaterally  Side stepping x 2 minutes x 2 sets with UE support.   ** Patient stated she felt much better and less stiff after this session.                           PT Education - 01/07/15 1130    Education provided Yes   Education Details Home exercise program, and the need to exercise for at least 5 minutes a day. Educated on the benefits of exercise.    Person(s) Educated Patient   Methods Explanation;Demonstration;Handout   Comprehension Verbalized understanding;Returned demonstration             PT Long Term Goals - 12/27/14 1815    PT LONG TERM GOAL #1   Title Patient will be independent with HEP for improved mobility, endurance   Baseline lacking knowledge of exercises and how to improve endurance.    Time 4   Period Weeks   Status New   PT LONG TERM GOAL #2   Title Patient will improve disability demonstrated by improved modified oswestry score of < 10%   Baseline 26%   Time 6   Period Weeks   Status New   PT LONG TERM GOAL #3   Title Patient will demonstrate improved endurance by completing the 6 MW test and walking more than 800'.   Baseline walked 600' in 4 min.    Time 6   Period Weeks   Status  New               Plan - 01/07/15 1136    Clinical Impression Statement Patient seems much more motivated to progress with HEP after this session. She felt much better and not as stiff after completing standing exercises, and will continue to benefit from progress of strength/balance activities to increase her thigh strength and endurance to allow her to complete recreational activities again.    Pt will benefit from skilled therapeutic intervention in order to improve on the following deficits Cardiopulmonary status limiting activity;Decreased activity tolerance;Decreased endurance;Decreased mobility;Decreased strength;Decreased balance   Rehab Potential Good   PT Frequency 2x / week   PT Duration 6 weeks   PT Treatment/Interventions Therapeutic exercise;Therapeutic activities;Balance training;Patient/family education;Aquatic Therapy   PT Home Exercise Plan See patient  instructions.    Consulted and Agree with Plan of Care Patient        Problem List There are no active problems to display for this patient.  Kerin Ransom, PT, DPT   01/07/2015, 12:51 PM  [place] [facility] PHYSICAL AND SPORTS MEDICINE [address], [place], [address] Phone: [phone]   Fax:  [phone]

## 2015-01-07 NOTE — Patient Instructions (Addendum)
All exercises provided were adapted from [email or web address]. Patient was provided a written handout with pictures as described. Any additional cues were manually entered in to handout and copied in to this document.     ELASTIC BAND LATERAL WALKS - MODIFIED  With an elastic band in both hands and under your feet, take side steps. Your knees should be slightly bent the entire time.    SINGLE LEG STANCE - LATERAL SLS  Stand on one leg and maintain your balance.   Next, hold your leg out to the side of your body.   Then return to original position.  Maintain a slightly bent knee on the stance side.    SIT TO STAND - NO HANDS  Start by sitting in a chair. Next, raise up to standing without using your hands for support.

## 2015-01-11 ENCOUNTER — Ambulatory Visit: Payer: Medicare Other | Admitting: Physical Therapy

## 2015-01-11 ENCOUNTER — Encounter: Payer: Medicare Other | Admitting: Physical Therapy

## 2015-01-11 DIAGNOSIS — R0609 Other forms of dyspnea: Secondary | ICD-10-CM

## 2015-01-11 DIAGNOSIS — R531 Weakness: Secondary | ICD-10-CM

## 2015-01-11 DIAGNOSIS — M5136 Other intervertebral disc degeneration, lumbar region: Secondary | ICD-10-CM

## 2015-01-11 NOTE — Therapy (Signed)
[facility] PHYS[facility]TS MEDICINE [name] [place], [place], [address] Phone: [phone]   Fax:  [phone]  Physical Therapy Treatment  Patient Details  Name: Kara Pennington MRN: [id number] Date of Birth: 12-13-1940 Referring Provider:  Patrice ParadiseMcLaughlin, Miriam K, MD  Encounter Date: 01/11/2015      PT End of Session - 01/11/15 1202    Visit Number 4   Number of Visits 13   Date for PT Re-Evaluation 02/07/15   Authorization Type 4   Authorization Time Period 10   PT Start Time 1100   PT Stop Time 1145   PT Time Calculation (min) 45 min   Activity Tolerance Patient tolerated treatment well;No increased pain   Behavior During Therapy [facility] for [facility] assessed/performed      Past Medical History  Diagnosis Date  . Anxiety   . Depression   . Hypertension     No past surgical history on file.  There were no vitals filed for this visit.  Visit Diagnosis:  Dyspnea on exertion  General weakness  DDD (degenerative disc disease), lumbar      Subjective Assessment - 01/11/15 1158    Subjective Patient reports that she is feeling well. Arrived on time and sob.    Limitations Walking;House hold activities   How long can you stand comfortably? not longer than 1 hour   How long can you walk comfortably? <5 min   Patient Stated Goals TO increase endurance, lose some weight.   Currently in Pain? No/denies                     Adult Aquatic Therapy - 01/11/15 1159    Aquatic Therapy Subjective   Subjective Patient reports feeling well, arrived on time this session. Does not report pain. SOB upon arrival.    Treatment   Gait Pt ambulated forward x6 laps (3 laps with increased speed), sidestepping x 2 laps, backward x 2 laps.   Exercises Standing exercises to include: marching, squats, hip abduction, hip extension and heel raises x 2 min each. Push/pull with kickboard x 2 min, and step ups x 20 each leg with UE support..                    PT Education - 01/11/15 1201    Education provided Yes   Education Details new exercises, cues for speed of ambulation.   Person(s) Educated Patient   Methods Explanation;Verbal cues   Comprehension Returned demonstration;Verbal cues required             PT Long Term Goals - 12/27/14 1815    PT LONG TERM GOAL #1   Title Patient will be independent with HEP for improved mobility, endurance   Baseline lacking knowledge of exercises and how to improve endurance.    Time 4   Period Weeks   Status New   PT LONG TERM GOAL #2   Title Patient will improve disability demonstrated by improved modified oswestry score of < 10%   Baseline 26%   Time 6   Period Weeks   Status New   PT LONG TERM GOAL #3   Title Patient will demonstrate improved endurance by completing the 6 MW test and walking more than 800'.   Baseline walked 600' in 4 min.    Time 6   Period Weeks   Status New               Plan -  01/11/15 1202    Clinical Impression Statement Patient tolerated all activites well in aquatic environment. Pt has no dyspnea on exertion in water.    Pt will benefit from skilled therapeutic intervention in order to improve on the following deficits Cardiopulmonary status limiting activity;Decreased activity tolerance;Decreased endurance;Decreased mobility;Decreased strength;Decreased balance   Rehab Potential Good   PT Frequency 2x / week   PT Duration 6 weeks   PT Treatment/Interventions Therapeutic exercise;Therapeutic activities;Balance training;Patient/family education;Aquatic Therapy   Consulted and Agree with Plan of Care Patient        Problem List There are no active problems to display for this patient.   Elaiza Shoberg, PT 01/11/2015, 12:04 PM   [facility] PHYSICAL AND SPORTS MEDICINE [address] [place], [address] Phone: [phone]   Fax:  [phone]

## 2015-01-13 ENCOUNTER — Encounter: Payer: Medicare Other | Admitting: Physical Therapy

## 2015-01-13 ENCOUNTER — Ambulatory Visit: Payer: Medicare Other | Admitting: Physical Therapy

## 2015-01-13 DIAGNOSIS — M5136 Other intervertebral disc degeneration, lumbar region: Secondary | ICD-10-CM

## 2015-01-13 DIAGNOSIS — R0609 Other forms of dyspnea: Secondary | ICD-10-CM

## 2015-01-13 DIAGNOSIS — R531 Weakness: Secondary | ICD-10-CM

## 2015-01-13 DIAGNOSIS — M51369 Other intervertebral disc degeneration, lumbar region without mention of lumbar back pain or lower extremity pain: Secondary | ICD-10-CM

## 2015-01-13 NOTE — Therapy (Signed)
[place] [facility] PHYSICAL AND SPORTS MEDICINE [name] [place], [place], [address] Phone: [phone]   Fax:  [phone]  Physical Therapy Treatment  Patient Details  Name: Kara Pennington MRN: [id number] Date of Birth: 1940-10-19 Referring Provider:  Patrice ParadiseMcLaughlin, Miriam K, MD  Encounter Date: 01/13/2015      PT End of Session - 01/13/15 1153    Visit Number 5   Number of Visits 13   Date for PT Re-Evaluation 02/07/15   Authorization Type 5   Authorization Time Period 10   PT Start Time 1100   PT Stop Time 1145   PT Time Calculation (min) 45 min   Activity Tolerance Patient tolerated treatment well;No increased pain   Behavior During Therapy [facility] for tasks assessed/performed      Past Medical History  Diagnosis Date  . Anxiety   . Depression   . Hypertension     No past surgical history on file.  There were no vitals filed for this visit.  Visit Diagnosis:  Dyspnea on exertion  General weakness  DDD (degenerative disc disease), lumbar      Subjective Assessment - 01/13/15 1149    Subjective Patient reports she was unable to do exercises in bed this am, so she is feeling a little stiff.    Limitations Walking;House hold activities   How long can you stand comfortably? not longer than 1 hour   How long can you walk comfortably? <5 min   Patient Stated Goals TO increase endurance, lose some weight.   Currently in Pain? No/denies                     Adult Aquatic Therapy - 01/13/15 1150    Aquatic Therapy Subjective   Subjective Pt unable to do morning exercises, so she feels a little stiff.    Treatment   Gait Pt ambulated forward x6 laps (3 laps with increased speed), sidestepping x 3 laps, backward x 2 laps.   Exercises Standing exercises to include: marching, squats, hip abduction, hip extension and heel raises x 2 min each. Push/pull with kickboard, lumbar rotation with kickboard x 2 min.   Specific Exercises  Hip/Low Back   Hip/Low Back seated exercises to include core stability with legs down sitting for 1-2 min. Laq, and bicyles x 2 min each without back support.                    PT Education - 01/13/15 1152    Education provided Yes   Education Details new exercises for core/low back strengthening. To include walking most days as able to increase tolerance.   Person(s) Educated Patient   Methods Explanation   Comprehension Verbalized understanding;Returned demonstration             PT Long Term Goals - 12/27/14 1815    PT LONG TERM GOAL #1   Title Patient will be independent with HEP for improved mobility, endurance   Baseline lacking knowledge of exercises and how to improve endurance.    Time 4   Period Weeks   Status New   PT LONG TERM GOAL #2   Title Patient will improve disability demonstrated by improved modified oswestry score of < 10%   Baseline 26%   Time 6   Period Weeks   Status New   PT LONG TERM GOAL #3   Title Patient will demonstrate improved endurance by completing the 6 MW test and walking more  than 800'.   Baseline walked 600' in 4 min.    Time 6   Period Weeks   Status New               Plan - 01/13/15 1153    Clinical Impression Statement Patient performing all aquatic activites without difficulty. Pt does not experience sob in pool.    Pt will benefit from skilled therapeutic intervention in order to improve on the following deficits Cardiopulmonary status limiting activity;Decreased activity tolerance;Decreased endurance;Decreased mobility;Decreased strength;Decreased balance   Rehab Potential Good   PT Frequency 2x / week   PT Duration 6 weeks   PT Treatment/Interventions Therapeutic exercise;Therapeutic activities;Balance training;Patient/family education;Aquatic Therapy   Consulted and Agree with Plan of Care Patient        Problem List There are no active problems to display for this patient.   Anastazja Isaac, PT,  MPT, GCS 01/13/2015, 11:55 AM  [place] [facility] PHYSICAL [facility] SPORTS MEDICINE [address] [place], [address] Phone: [phone]   Fax:  [phone]

## 2015-01-17 ENCOUNTER — Encounter: Payer: Medicare Other | Admitting: Physical Therapy

## 2015-01-18 ENCOUNTER — Ambulatory Visit: Payer: Medicare Other | Admitting: Physical Therapy

## 2015-01-18 ENCOUNTER — Encounter: Payer: Medicare Other | Admitting: Physical Therapy

## 2015-01-20 ENCOUNTER — Encounter: Payer: Medicare Other | Admitting: Physical Therapy

## 2015-01-21 ENCOUNTER — Ambulatory Visit: Payer: Medicare Other | Admitting: Physical Therapy

## 2015-01-21 ENCOUNTER — Encounter: Payer: Medicare Other | Admitting: Physical Therapy

## 2015-01-24 ENCOUNTER — Encounter: Payer: Medicare Other | Admitting: Physical Therapy

## 2015-01-24 ENCOUNTER — Ambulatory Visit: Payer: Medicare Other | Admitting: Physical Therapy

## 2015-01-25 ENCOUNTER — Ambulatory Visit: Payer: Medicare Other | Admitting: Physical Therapy

## 2015-01-25 ENCOUNTER — Encounter: Payer: Medicare Other | Admitting: Physical Therapy

## 2015-01-27 ENCOUNTER — Ambulatory Visit: Payer: Medicare Other | Admitting: Physical Therapy

## 2015-01-27 ENCOUNTER — Encounter: Payer: Medicare Other | Admitting: Physical Therapy

## 2015-01-27 DIAGNOSIS — R0609 Other forms of dyspnea: Secondary | ICD-10-CM | POA: Diagnosis not present

## 2015-01-27 NOTE — Therapy (Signed)
[place] [facility] PHYSICAL [facility] SPORTS MEDICINE [address] [place], [address] Phone: [phone]   Fax:  [phone]  Physical Therapy Treatment  Patient Details  Name: Kara Pennington MRN: [id number] Date of Birth: 1940/08/18 Referring Provider:  Patrice Paradise, MD  Encounter Date: 01/27/2015      PT End of Session - 01/27/15 1158    Visit Number 6   Number of Visits 13   Date for PT Re-Evaluation 02/07/15   Authorization Type 6   Authorization Time Period 10   PT Start Time 1057   PT Stop Time 1140   PT Time Calculation (min) 43 min   Activity Tolerance Patient tolerated treatment well;No increased pain   Behavior During Therapy [facility] for tasks assessed/performed      Past Medical History  Diagnosis Date  . Anxiety   . Depression   . Hypertension     No past surgical history on file.  There were no vitals filed for this visit.  Visit Diagnosis:  Dyspnea on exertion  General weakness  DDD (degenerative disc disease), lumbar      Subjective Assessment - 01/27/15 1637    Subjective Patient reports she has been hit or miss with her exercises. States she has been more forgetful recently. She has lost 4.5 pounds on her new diet.    Limitations Walking;House hold activities   How long can you stand comfortably? not longer than 1 hour   How long can you walk comfortably? <5 min   Patient Stated Goals TO increase endurance, lose some weight.   Currently in Pain? No/denies       Aquatic Therapy  Forward ambulation x 6 laps Retro ambulation x 6 laps  Lateral ambulation x 4 laps  Squats with HHA x 1 minute Standing hip extensions x 1 minute per leg  Bicep curls/tricep extensions x 1 minute with water weights  Shoulder flexion with water weights x 1 minute  Standing reverse flyes with water weights x 1 minute  Standing marching with no HHA x 1.5 minutes  Standing hip abductions x 1.5 minutes (developed some discomfort  in left hip) Push-pull with pool noodle against manual resistance x 1.5 minutes  Standing rotations with kickboard x 1.5 minutes  Squat and walk drill x half lap                            PT Education - 01/27/15 1158    Education provided Yes   Education Details To continue with exercise program at home on a consistent basis.    Person(s) Educated Patient   Methods Explanation;Demonstration   Comprehension Verbalized understanding;Returned demonstration             PT Long Term Goals - 12/27/14 1815    PT LONG TERM GOAL #1   Title Patient will be independent with HEP for improved mobility, endurance   Baseline lacking knowledge of exercises and how to improve endurance.    Time 4   Period Weeks   Status New   PT LONG TERM GOAL #2   Title Patient will improve disability demonstrated by improved modified oswestry score of < 10%   Baseline 26%   Time 6   Period Weeks   Status New   PT LONG TERM GOAL #3   Title Patient will demonstrate improved endurance by completing the 6 MW test and walking more than 800'.   Baseline walked  600' in 4 min.    Time 6   Period Weeks   Status New               Plan - 01/27/15 1158    Clinical Impression Statement Patient does not experience any pain or shortness of breath in the pool today (minor hip discomfort which resolved). She is able to complete all exercises with minimal rest breaks. Patient would benefit from continued aerobic conditioning and LE strengthening.    Pt will benefit from skilled therapeutic intervention in order to improve on the following deficits Cardiopulmonary status limiting activity;Decreased activity tolerance;Decreased endurance;Decreased mobility;Decreased strength;Decreased balance   Rehab Potential Good   PT Frequency 2x / week   PT Duration 6 weeks   PT Treatment/Interventions Therapeutic exercise;Therapeutic activities;Balance training;Patient/family education;Aquatic Therapy    PT Next Visit Plan Progress aerobic conditioning and LE strengthening   Consulted and Agree with Plan of Care Patient        Problem List There are no active problems to display for this patient.   Kerin Ransom, PT, DPT    01/27/2015, 4:38 PM  [place] [facility] PHYSICAL AND SPORTS MEDICINE [address], [place], [address] Phone: [phone]   Fax:  [phone]

## 2015-01-31 ENCOUNTER — Ambulatory Visit: Payer: Medicare Other | Admitting: Physical Therapy

## 2015-01-31 VITALS — HR 90

## 2015-01-31 DIAGNOSIS — R531 Weakness: Secondary | ICD-10-CM

## 2015-01-31 DIAGNOSIS — R0609 Other forms of dyspnea: Secondary | ICD-10-CM

## 2015-01-31 DIAGNOSIS — M5136 Other intervertebral disc degeneration, lumbar region: Secondary | ICD-10-CM

## 2015-01-31 DIAGNOSIS — M51369 Other intervertebral disc degeneration, lumbar region without mention of lumbar back pain or lower extremity pain: Secondary | ICD-10-CM

## 2015-01-31 NOTE — Therapy (Signed)
[place] [facility] PHYSICAL AND SPORTS MEDICINE [address] [place], [address] Phone: [phone]   Fax:  [phone]  Physical Therapy Treatment  Patient Details  Name: Kara Pennington MRN: [id number] Date of Birth: Sep 26, 1940 Referring Provider:  Patrice Paradise, MD  Encounter Date: 01/31/2015      PT End of Session - 01/31/15 1439    Visit Number 7   Number of Visits 13   Date for PT Re-Evaluation 02/07/15   Authorization Type 7   Authorization Time Period 10   PT Start Time 1345   PT Stop Time 1430   PT Time Calculation (min) 45 min   Activity Tolerance Patient tolerated treatment well   Behavior During Therapy [facility] for tasks assessed/performed      Past Medical History  Diagnosis Date  . Anxiety   . Depression   . Hypertension     No past surgical history on file.  Filed Vitals:   01/31/15 1432  Pulse: 90  SpO2: 98%    Visit Diagnosis:  Dyspnea on exertion  General weakness  DDD (degenerative disc disease), lumbar      Subjective Assessment - 01/31/15 1432    Subjective Patient reports she feels like she  is less short of breath and has more energy than when she first started PT. She has lost 8 lbs in 6 weeks on diet.    Limitations Walking;House hold activities   How long can you stand comfortably? not longer than 1 hour   How long can you walk comfortably? <5 min   Patient Stated Goals TO increase endurance, lose some weight.   Currently in Pain? No/denies   Multiple Pain Sites No                         [facility] Adult PT Treatment/Exercise - 01/31/15 0001    Ambulation/Gait   Gait Comments walked 900' in 4 min 40 sec   Knee/Hip Exercises: Aerobic   Nustep x 6 min level 3, then 8 min level 3   Knee/Hip Exercises: Standing   Heel Raises Both;1 set;20 reps   Terminal Knee Extension Strengthening;Both;2 sets;10 reps  3# weights   Hip Abduction Stengthening;Both;1 set;20 reps   Forward  Step Up Both;1 set;10 reps;Step Height: 8"   Knee/Hip Exercises: Seated   Long Arc Quad Strengthening;2 sets;10 reps  3# weights   Marching Strengthening;2 sets;10 reps  3# weights                PT Education - 01/31/15 1439    Education provided Yes   Education Details continue with HEP, new exercises.    Person(s) Educated Patient   Methods Explanation   Comprehension Verbalized understanding;Returned demonstration             PT Long Term Goals - 01/31/15 1441    PT LONG TERM GOAL #1   Title Patient will be independent with HEP for improved mobility, endurance   Baseline lacking knowledge of exercises and how to improve endurance.    Time 4   Period Weeks   Status On-going   PT LONG TERM GOAL #2   Title Patient will improve disability demonstrated by improved modified oswestry score of < 10%   Baseline 26%   Time 6   Period Weeks   Status On-going   PT LONG TERM GOAL #3   Title Patient will demonstrate improved endurance by completing the 6 MW test  and walking more than 800'.   Baseline walked 600' in 4 min.    Time 6   Period Weeks   Status Achieved   PT LONG TERM GOAL #4   Title Patient will demonstrate increased endurance and gait speed by completing and walking > 1200'   Baseline walked 900' in 4 min 40 sec   Time 3   Period Weeks   Status New               Plan - 02-12-15 1440    Clinical Impression Statement Patient continues to have SOB with exertion. HR after test 90bpm, O2 sats at 98%. Left leg is weaker than right.    Pt will benefit from skilled therapeutic intervention in order to improve on the following deficits Cardiopulmonary status limiting activity;Decreased activity tolerance;Decreased endurance;Decreased mobility;Decreased strength;Decreased balance   Rehab Potential Good   PT Frequency 2x / week   PT Duration 6 weeks   PT Treatment/Interventions Therapeutic exercise;Therapeutic activities;Balance  training;Patient/family education;Aquatic Therapy   PT Next Visit Plan Progress aerobic conditioning and LE strengthening   PT Home Exercise Plan See patient instructions.    Consulted and Agree with Plan of Care Patient          G-Codes - 02/12/2015 1444    Functional Assessment Tool Used 6 min walk, TUG, Oswestry, clinical judgement   Functional Limitation Mobility: Walking and moving around   Mobility: Walking and Moving Around Current Status ([id number]) At least 20 percent but less than 40 percent impaired, limited or restricted   Mobility: Walking and Moving Around Goal Status [phone]) At least 1 percent but less than 20 percent impaired, limited or restricted      Problem List There are no active problems to display for this patient.   Agueda Houpt, PT, MPT, GCS 2015/02/12, 2:45 PM  [place] [facility] PHYSICAL AND SPORTS MEDICINE [address], [place], [address] Phone: [phone]   Fax:  [phone]

## 2015-02-01 ENCOUNTER — Ambulatory Visit: Payer: Medicare Other | Admitting: Physical Therapy

## 2015-02-03 ENCOUNTER — Ambulatory Visit: Payer: Medicare Other | Admitting: Physical Therapy

## 2015-02-03 DIAGNOSIS — M5136 Other intervertebral disc degeneration, lumbar region: Secondary | ICD-10-CM

## 2015-02-03 DIAGNOSIS — R0609 Other forms of dyspnea: Secondary | ICD-10-CM | POA: Diagnosis not present

## 2015-02-03 DIAGNOSIS — R531 Weakness: Secondary | ICD-10-CM

## 2015-02-03 NOTE — Therapy (Signed)
[facility] [address] [place], [address] Phone: [phone]   Fax:  [phone]  Physical Therapy Treatment  Patient Details  Name: Kara Pennington MRN: [id number] Date of Birth: [birth year] Referring Provider:  Patrice Paradise, MD  Encounter Date: 02/03/2015      PT End of Session - 02/03/15 1258    Visit Number 8   Number of Visits 13   Date for PT Re-Evaluation 02/07/15   Authorization Type 8   Authorization Time Period 10   PT Start Time 1055   PT Stop Time 1140   PT Time Calculation (min) 45 min   Activity Tolerance Patient tolerated treatment well;No increased pain   Behavior During Therapy [facility] for tasks assessed/performed      Past Medical History  Diagnosis Date  . Anxiety   . Depression   . Hypertension     No past surgical history on file.  There were no vitals filed for this visit.  Visit Diagnosis:  Dyspnea on exertion  General weakness  DDD (degenerative disc disease), lumbar      Subjective Assessment - 02/03/15 1251    Subjective Patient reports she is feeling better, breathing better. No SOB with aquatic activities. " I couldn't straighten up until I got in the pool."    Limitations Walking;House hold activities   How long can you stand comfortably? not longer than 1 hour   How long can you walk comfortably? <5 min   Patient Stated Goals TO increase endurance, lose some weight.   Currently in Pain? No/denies   Pain Location Back   Pain Descriptors / Indicators Aching   Multiple Pain Sites No                     Adult Aquatic Therapy - 02/03/15 1255    Aquatic Therapy Subjective   Subjective Patient reports she was unable to stand up straight until she got in the pool"   Treatment   Gait Pt ambulated forward x6 laps (3 laps with increased speed), sidestepping x 3 laps, backward x 2 laps, marching gait x 2 laps..   Exercises Standing exercises to  include: marching, squats, hip abduction, hip extension and heel raises x 2 min each. Push/pull with kickboard, lumbar rotation with kickboard x 2 min.   Specific Exercises Hip/Low Back   Hip/Low Back seated exercises to include core stability with legs down sitting for 1-2 min. Laq, hip abd/add and bicyles x 2 min each without back support.                    PT Education - 02/03/15 1258    Education provided Yes   Education Details pool exercises   Person(s) Educated Patient   Methods Explanation   Comprehension Verbalized understanding;Returned demonstration             PT Long Term Goals - 01/31/15 1441    PT LONG TERM GOAL #1   Title Patient will be independent with HEP for improved mobility, endurance   Baseline lacking knowledge of exercises and how to improve endurance.    Time 4   Period Weeks   Status On-going   PT LONG TERM GOAL #2   Title Patient will improve disability demonstrated by improved modified oswestry score of < 10%   Baseline 26%   Time 6   Period Weeks   Status On-going   PT LONG TERM GOAL #  3   Title Patient will demonstrate improved endurance by completing the 6 MW test and walking more than 800'.   Baseline walked 600' in 4 min.    Time 6   Period Weeks   Status Achieved   PT LONG TERM GOAL #4   Title Patient will demonstrate increased endurance and gait speed by completing and walking > 1200'   Baseline walked 900' in 4 min 40 sec   Time 3   Period Weeks   Status New               Plan - 02/03/15 1259    Clinical Impression Statement Patient continues to be deconditioned and has imrpoved with exercise and endurance training. Patient is reporting decreased back pain overall.    Pt will benefit from skilled therapeutic intervention in order to improve on the following deficits Cardiopulmonary status limiting activity;Decreased activity tolerance;Decreased endurance;Decreased mobility;Decreased strength;Decreased  balance   Rehab Potential Good   PT Frequency 2x / week   PT Duration 6 weeks   PT Treatment/Interventions Therapeutic exercise;Therapeutic activities;Balance training;Patient/family education;Aquatic Therapy   PT Next Visit Plan Progress aerobic conditioning and LE strengthening   PT Home Exercise Plan See patient instructions.    Consulted and Agree with Plan of Care Patient        Problem List There are no active problems to display for this patient.   Saysha Menta, PT, MPT, GCS 02/03/2015, 1:03 PM  [place] [facility] [address] [place], [address] Phone: [phone]   Fax:  [phone]

## 2015-02-07 ENCOUNTER — Ambulatory Visit: Payer: Medicare Other | Attending: Physician Assistant | Admitting: Physical Therapy

## 2015-02-07 DIAGNOSIS — R531 Weakness: Secondary | ICD-10-CM

## 2015-02-07 DIAGNOSIS — R0609 Other forms of dyspnea: Secondary | ICD-10-CM | POA: Diagnosis present

## 2015-02-07 NOTE — Therapy (Signed)
[facility] [address] [place], [address] Phone: [phone]   Fax:  [phone]  Physical Therapy Treatment  Patient Details  Name: Kara Pennington MRN: [id number] Date of Birth: 05/15/1941 Referring Provider:  Patrice Paradise, MD  Encounter Date: 02/07/2015      PT End of Session - 02/07/15 1431    Visit Number 1   Number of Visits 9   Date for PT Re-Evaluation 03/07/15   Authorization Type 1   Authorization Time Period 10   PT Start Time 1300   PT Stop Time 1345   PT Time Calculation (min) 45 min   Activity Tolerance Patient tolerated treatment well;No increased pain   Behavior During Therapy [facility] for tasks assessed/performed      Past Medical History  Diagnosis Date  . Anxiety   . Depression   . Hypertension     No past surgical history on file.  There were no vitals filed for this visit.  Visit Diagnosis:  Dyspnea on exertion  General weakness      Subjective Assessment - 02/07/15 1430    Subjective Patient reports she is doing well. SHe feels like her breathing has improved.    Limitations Walking;House hold activities   How long can you stand comfortably? not longer than 1 hour   How long can you walk comfortably? <5 min   Patient Stated Goals TO increase endurance, lose some weight.   Currently in Pain? No/denies         Treatment  Nu step level 4 x for 663 steps. Standing hip abd,HR with #3 weights x 20 bilaterally STS 2x10 Step ups on 8" step 2x10 Mini squats x20 Seated hip add, hip abd red TB, knee flexion Red TB x20           PT Education - 02/07/15 1431    Education provided Yes   Education Details exercises, form, POC   Person(s) Educated Patient   Methods Explanation   Comprehension Verbalized understanding;Returned demonstration             PT Long Term Goals - 01/31/15 1441    PT LONG TERM GOAL #1   Title Patient will be  independent with HEP for improved mobility, endurance   Baseline lacking knowledge of exercises and how to improve endurance.    Time 4   Period Weeks   Status On-going   PT LONG TERM GOAL #2   Title Patient will improve disability demonstrated by improved modified oswestry score of < 10%   Baseline 26%   Time 6   Period Weeks   Status On-going   PT LONG TERM GOAL #3   Title Patient will demonstrate improved endurance by completing the 6 MW test and walking more than 800'.   Baseline walked 600' in 4 min.    Time 6   Period Weeks   Status Achieved   PT LONG TERM GOAL #4   Title Patient will demonstrate increased endurance and gait speed by completing and walking > 1200'   Baseline walked 900' in 4 min 40 sec   Time 3   Period Weeks   Status New               Plan - 02/07/15 1434    Clinical Impression Statement Patient continues to make progress, having decreased SOB with activity. Improved cardiovascular endurance.    Pt will benefit from skilled therapeutic intervention in  order to improve on the following deficits Cardiopulmonary status limiting activity;Decreased activity tolerance;Decreased endurance;Decreased mobility;Decreased strength;Decreased balance   Rehab Potential Good   PT Frequency 2x / week   PT Duration 4 weeks   PT Treatment/Interventions Therapeutic exercise;Therapeutic activities;Balance training;Patient/family education;Aquatic Therapy   PT Next Visit Plan Progress aerobic conditioning and LE strengthening   PT Home Exercise Plan See patient instructions.    Consulted and Agree with Plan of Care Patient        Problem List There are no active problems to display for this patient.   Jerett Odonohue, PT, MPT, GCS 02/07/2015, 2:37 PM  [place] [facility] [address] [place], [address] Phone: [phone]   Fax:  [phone]

## 2015-02-08 ENCOUNTER — Ambulatory Visit: Payer: Medicare Other | Admitting: Physical Therapy

## 2015-02-08 DIAGNOSIS — R531 Weakness: Secondary | ICD-10-CM

## 2015-02-08 DIAGNOSIS — R0609 Other forms of dyspnea: Secondary | ICD-10-CM

## 2015-02-08 NOTE — Therapy (Signed)
[place] [facility] [address], [place], [address] Phone: [phone]   Fax:  [phone]  Physical Therapy Treatment  Patient Details  Name: Kara Pennington MRN: [id number] Date of Birth: July 16, 1940 Referring Provider:  Patrice Paradise, MD  Encounter Date: 02/08/2015      PT End of Session - 02/08/15 1209    Visit Number 2   Number of Visits 9   Date for PT Re-Evaluation 03/07/15   Authorization Type 2   Authorization Time Period 10   PT Start Time 1120   PT Stop Time 1200   PT Time Calculation (min) 40 min   Activity Tolerance Patient tolerated treatment well;No increased pain   Behavior During Therapy [facility] for tasks assessed/performed      Past Medical History  Diagnosis Date  . Anxiety   . Depression   . Hypertension     No past surgical history on file.  There were no vitals filed for this visit.  Visit Diagnosis:  Dyspnea on exertion  General weakness      Subjective Assessment - 02/08/15 1206    Subjective Patient denies pain, no conerns today.   Limitations Walking;House hold activities   How long can you stand comfortably? not longer than 1 hour   How long can you walk comfortably? <5 min   Patient Stated Goals TO increase endurance, lose some weight.   Currently in Pain? No/denies                     Adult Aquatic Therapy - 02/08/15 1207    Aquatic Therapy Subjective   Subjective No conerns today, denies pain   Treatment   Gait Pt ambulated forward x6 laps (3 laps with increased speed), sidestepping x 3 laps, backward x 2 laps..   Exercises Standing exercises to include: marching, squats, hip abduction, hip extension,flexion and heel raises x 2 min each. Push/pull with kickboard, lumbar rotation with kickboard x 2 min.   Specific Exercises Hip/Low Back   Hip/Low Back seated exercises to include core stability with legs down sitting for 1-2 min. Laq, hip abd/add and  bicyles x 2 min each without back support.                    PT Education - 02/08/15 1209    Education provided Yes   Education Details pool vs land therapy and benefits   Person(s) Educated Patient   Methods Explanation   Comprehension Verbalized understanding             PT Long Term Goals - 01/31/15 1441    PT LONG TERM GOAL #1   Title Patient will be independent with HEP for improved mobility, endurance   Baseline lacking knowledge of exercises and how to improve endurance.    Time 4   Period Weeks   Status On-going   PT LONG TERM GOAL #2   Title Patient will improve disability demonstrated by improved modified oswestry score of < 10%   Baseline 26%   Time 6   Period Weeks   Status On-going   PT LONG TERM GOAL #3   Title Patient will demonstrate improved endurance by completing the 6 MW test and walking more than 800'.   Baseline walked 600' in 4 min.    Time 6   Period Weeks   Status Achieved   PT LONG TERM GOAL #4   Title Patient will demonstrate increased  endurance and gait speed by completing and walking > 1200'   Baseline walked 900' in 4 min 40 sec   Time 3   Period Weeks   Status New               Plan - 02/08/15 1210    Clinical Impression Statement Patient is making good progress with overall strength and endurance. No pain reported.   Pt will benefit from skilled therapeutic intervention in order to improve on the following deficits Cardiopulmonary status limiting activity;Decreased activity tolerance;Decreased endurance;Decreased mobility;Decreased strength;Decreased balance   Rehab Potential Good   PT Frequency 2x / week   PT Duration 4 weeks   PT Treatment/Interventions Therapeutic exercise;Therapeutic activities;Balance training;Patient/family education;Aquatic Therapy   PT Next Visit Plan Progress aerobic conditioning and LE strengthening   Consulted and Agree with Plan of Care Patient        Problem List There  are no active problems to display for this patient.   Nickholas Goldston, PT, MPT, GCS 02/08/2015, 12:11 PM  [place] [facility] [address] [place], [address] Phone: [phone]   Fax:  [phone]

## 2015-02-15 ENCOUNTER — Ambulatory Visit: Payer: Medicare Other | Admitting: Physical Therapy

## 2015-02-15 DIAGNOSIS — R0609 Other forms of dyspnea: Secondary | ICD-10-CM | POA: Diagnosis not present

## 2015-02-15 DIAGNOSIS — R531 Weakness: Secondary | ICD-10-CM

## 2015-02-15 NOTE — Therapy (Signed)
[facility] [address] [place], [address] Phone: [phone]   Fax:  [phone]  Physical Therapy Treatment  Patient Details  Name: Kara Pennington MRN: [id number] Date of Birth: [birth year] Referring Provider:  Patrice Paradise, MD  Encounter Date: 02/15/2015      PT End of Session - 02/15/15 1210    Visit Number 3   Number of Visits 9   Date for PT Re-Evaluation 03/07/15   Authorization Type 3   Authorization Time Period 10   PT Start Time 1000   PT Stop Time 1045   PT Time Calculation (min) 45 min   Activity Tolerance Patient tolerated treatment well   Behavior During Therapy [facility] for tasks assessed/performed      Past Medical History  Diagnosis Date  . Anxiety   . Depression   . Hypertension     No past surgical history on file.  There were no vitals filed for this visit.  Visit Diagnosis:  Dyspnea on exertion  General weakness      Subjective Assessment - 02/15/15 1209    Subjective Patient out of breath on arrival, just woke up. Feelin fine.    Patient Stated Goals TO increase endurance, lose some weight.   Currently in Pain? No/denies                     Adult Aquatic Therapy - 02/15/15 1210    Aquatic Therapy Subjective   Subjective No conerns today, denies pain   Treatment   Gait Pt ambulated forward x6 laps (3 laps with increased speed), sidestepping x 3 laps, backward x 2 laps..   Exercises Standing exercises to include: marching, squats, hip abduction, hip extension,flexion and heel raises x 2 min each. Push/pull with kickboard, lumbar rotation with kickboard x 2 min.   Specific Exercises Hip/Low Back   Hip/Low Back seated exercises to include core stability with legs down sitting for 1-2 min. Laq, hip abd/add and bicyles x 2 min each without back support.                    PT Education - 02/15/15 1210    Education provided Yes   Education  Details instructions for activities   Person(s) Educated Patient   Methods Explanation;Demonstration   Comprehension Verbalized understanding;Returned demonstration             PT Long Term Goals - 01/31/15 1441    PT LONG TERM GOAL #1   Title Patient will be independent with HEP for improved mobility, endurance   Baseline lacking knowledge of exercises and how to improve endurance.    Time 4   Period Weeks   Status On-going   PT LONG TERM GOAL #2   Title Patient will improve disability demonstrated by improved modified oswestry score of < 10%   Baseline 26%   Time 6   Period Weeks   Status On-going   PT LONG TERM GOAL #3   Title Patient will demonstrate improved endurance by completing the 6 MW test and walking more than 800'.   Baseline walked 600' in 4 min.    Time 6   Period Weeks   Status Achieved   PT LONG TERM GOAL #4   Title Patient will demonstrate increased endurance and gait speed by completing and walking > 1200'   Baseline walked 900' in 4 min 40 sec   Time 3  Period Weeks   Status New               Plan - 02/15/15 1211    Clinical Impression Statement Patient is continuing to make good progress with endurance and strength in pool.    Pt will benefit from skilled therapeutic intervention in order to improve on the following deficits Cardiopulmonary status limiting activity;Decreased activity tolerance;Decreased endurance;Decreased mobility;Decreased strength;Decreased balance   Rehab Potential Good   PT Frequency 2x / week   PT Duration 4 weeks   PT Treatment/Interventions Therapeutic exercise;Therapeutic activities;Balance training;Patient/family education;Aquatic Therapy   PT Next Visit Plan Progress aerobic conditioning and LE strengthening   PT Home Exercise Plan See patient instructions.    Consulted and Agree with Plan of Care Patient        Problem List There are no active problems to display for this  patient.   Trinnity Breunig, PT, MPT 02/15/2015, 12:12 PM  [place] [facility] [address], [place], [address] Phone: [phone]   Fax:  [phone]

## 2015-02-17 ENCOUNTER — Ambulatory Visit: Payer: Medicare Other | Admitting: Physical Therapy

## 2015-02-17 DIAGNOSIS — R0609 Other forms of dyspnea: Secondary | ICD-10-CM

## 2015-02-17 DIAGNOSIS — R531 Weakness: Secondary | ICD-10-CM

## 2015-02-17 NOTE — Therapy (Signed)
[place] [facility] [address] [place], [address] Phone: [phone]   Fax:  [phone]  Physical Therapy Treatment  Patient Details  Name: Kara Pennington MRN: [id number] Date of Birth: 05-Jul-1941 Referring Provider:  Patrice Paradise, MD  Encounter Date: 02/17/2015      PT End of Session - 02/17/15 1209    Visit Number 4   Number of Visits 9   Date for PT Re-Evaluation 03/07/15   Authorization Type 4   Authorization Time Period 10   PT Start Time 1050   PT Stop Time 1140   PT Time Calculation (min) 50 min   Activity Tolerance Patient tolerated treatment well;No increased pain   Behavior During Therapy [facility] for tasks assessed/performed      Past Medical History  Diagnosis Date  . Anxiety   . Depression   . Hypertension     No past surgical history on file.  There were no vitals filed for this visit.  Visit Diagnosis:  Dyspnea on exertion  General weakness      Subjective Assessment - 02/17/15 1207    Subjective Patient reports that she is feeling better overall. has upcoming trip to [place] with her granddaughter.    Limitations Walking;House hold activities   Patient Stated Goals TO increase endurance, lose some weight.   Currently in Pain? No/denies                     Adult Aquatic Therapy - 02/17/15 1208    Aquatic Therapy Subjective   Subjective Reports she is feeling better.    Treatment   Gait Pt ambulated forward x5 laps (2 laps with increased speed), sidestepping x 3 laps, backward x 3 laps..   Exercises Standing exercises to include: step ups x 20 bilaterally with handrail assist, marching, squats, hip abduction, hip extension,flexion and heel raises x 2 min each. Push/pull with kickboard, lumbar rotation with kickboard x 2 min.                    PT Education - 02/17/15 1209    Education provided Yes   Education Details progression after this to  community facility.   Person(s) Educated Patient   Methods Explanation   Comprehension Verbalized understanding             PT Long Term Goals - 01/31/15 1441    PT LONG TERM GOAL #1   Title Patient will be independent with HEP for improved mobility, endurance   Baseline lacking knowledge of exercises and how to improve endurance.    Time 4   Period Weeks   Status On-going   PT LONG TERM GOAL #2   Title Patient will improve disability demonstrated by improved modified oswestry score of < 10%   Baseline 26%   Time 6   Period Weeks   Status On-going   PT LONG TERM GOAL #3   Title Patient will demonstrate improved endurance by completing the 6 MW test and walking more than 800'.   Baseline walked 600' in 4 min.    Time 6   Period Weeks   Status Achieved   PT LONG TERM GOAL #4   Title Patient will demonstrate increased endurance and gait speed by completing and walking > 1200'   Baseline walked 900' in 4 min 40 sec   Time 3   Period Weeks   Status New  Plan - 02/17/15 1210    Clinical Impression Statement Patient is making good progress with endurance and strength. No reported pain lately.    Pt will benefit from skilled therapeutic intervention in order to improve on the following deficits Cardiopulmonary status limiting activity;Decreased activity tolerance;Decreased endurance;Decreased mobility;Decreased strength;Decreased balance   PT Frequency 2x / week   PT Duration 4 weeks   PT Treatment/Interventions Therapeutic exercise;Therapeutic activities;Balance training;Patient/family education;Aquatic Therapy   PT Next Visit Plan Progress aerobic conditioning and LE strengthening   Consulted and Agree with Plan of Care Patient        Problem List There are no active problems to display for this patient.   Tanisia Yokley, PT, MPT 02/17/2015, 12:12 PM  [place] [facility] [address], [place], [address] Phone: [phone]   Fax:  [phone]

## 2015-02-24 ENCOUNTER — Encounter: Payer: Self-pay | Admitting: Physical Therapy

## 2015-02-24 ENCOUNTER — Ambulatory Visit: Payer: Medicare Other

## 2015-02-24 DIAGNOSIS — R0609 Other forms of dyspnea: Secondary | ICD-10-CM

## 2015-02-24 DIAGNOSIS — R531 Weakness: Secondary | ICD-10-CM

## 2015-02-24 NOTE — Therapy (Signed)
[place] [facility]ICINE [address] [place], [address] Phone: [phone]   Fax:  [phone]  Physical Therapy Treatment  Patient Details  Name: Kara Pennington MRN: [id number] Date of Birth: Nov 07, 1940 Referring Provider:  Patrice Paradise, MD  Encounter Date: 02/24/2015      PT End of Session - 02/24/15 1629    Visit Number 5   Number of Visits 9   Date for PT Re-Evaluation 03/07/15   Authorization Type 5   Authorization Time Period 10   PT Start Time 1615   PT Stop Time 1700   PT Time Calculation (min) 45 min   Activity Tolerance Patient tolerated treatment well;No increased pain   Behavior During Therapy [facility] for tasks assessed/performed      Past Medical History  Diagnosis Date  . Anxiety   . Depression   . Hypertension     History reviewed. No pertinent past surgical history.  There were no vitals filed for this visit.  Visit Diagnosis:  Dyspnea on exertion  General weakness      Subjective Assessment - 02/24/15 1620    Subjective Pt states that she got back from her trip to [facility] at 7:30PM last night. She walked a total of 7 miles over the course of 2 days. Pt states that she is very tired today. She denies low back pain upon arrival at rest. Reports inconsistent performance with HEP   Limitations Walking;House hold activities   How long can you stand comfortably? 30-45 minutes   How long can you walk comfortably? 30 minutes   Patient Stated Goals To increase endurance, lose some weight.   Currently in Pain? No/denies      Treatment  Nu step level 4 x 5 min; Total gym squats 2 x 10; Total gym heel raises 2 x 10; Standing lateral lunges x 10 bilateral; Standing hip flexion, abduction, and extension with #2 weights x 10 bilaterally; While pt sitting she reports some posterior medial L thigh pain just above the popliteal fossa. Palpated leg to find multiple superficial veins (some  varicose and some not) in patient's leg with palpable "knot" in one superficial vein. Leg is not swollen but some mild superficial reddenning noted. Pt with a remote history of 2 prior DVTs. Pt just returned from [facility] on a flight last night. Denies SOB or active cancer treatment. Completed [place] Clinical Prediction rule and patient scored a 2 which placers her in the moderate risk category. Pt advised to go to ED to be assessed but she currently refuses and states she will watch her leg to see if it gets worse. Pt educated regarding signs and symptoms of DVT as well as PE. Discussed risks of PE from DVT including death. Pt acknowledges understanding.           PT Education - 02/24/15 1622    Education provided Yes   Education Details Continue HEP. Correction for form and technique, signs of DVT and PE   Person(s) Educated Patient   Methods Explanation   Comprehension Verbalized understanding             PT Long Term Goals - 02/25/15 1206    PT LONG TERM GOAL #1   Title Patient will be independent with HEP for improved mobility, endurance   Baseline lacking knowledge of exercises and how to improve endurance.    Time 4   Period Weeks   Status On-going   PT LONG TERM  GOAL #2   Title Patient will improve disability demonstrated by improved modified oswestry score of < 10%   Baseline 26%, 02/24/15: 22%   Time 6   Period Weeks   Status On-going   PT LONG TERM GOAL #3   Title Patient will demonstrate improved endurance by completing the 6 MW test and walking more than 800'.   Baseline walked 600' in 4 min.    Time 6   Period Weeks   Status Achieved   PT LONG TERM GOAL #4   Title Patient will demonstrate increased endurance and gait speed by completing and walking > 1200'   Baseline walked 900' in 4 min 40 sec   Time 3   Period Weeks   Status New               Plan - 02/25/15 1202    Clinical Impression Statement Pt reports considerable fatigue today after having  just returned from [facility] last night. Modified ODI: 22% which is only very slightly improved from initial evaluation of 26%. Pt states she walked 7 miles over 2 days so elected not to perform 6 MWT today due to fatigue and pt request. Pt is very encouraged about her trip and ability to tolerate all the walking required. At the end of session pt does report some posteromedial thigh pain just above popliteal fossa. Assessed leg and recommended pt go to ED to rule out deep or superficial thrombosis. Pt declines at this time. See treatment note for more information. Pt encouraged to continue HEP and follow-up as scheduled.    Pt will benefit from skilled therapeutic intervention in order to improve on the following deficits Cardiopulmonary status limiting activity;Decreased activity tolerance;Decreased endurance;Decreased mobility;Decreased strength;Decreased balance   Rehab Potential Good   PT Frequency 2x / week   PT Duration 4 weeks   PT Treatment/Interventions Therapeutic exercise;Therapeutic activities;Balance training;Patient/family education;Aquatic Therapy   PT Next Visit Plan Progress aerobic conditioning and LE strengthening. Reassess 6 MWT as appropriate.    Consulted and Agree with Plan of Care Patient        Problem List There are no active problems to display for this patient.  Lynnea Maizes PT, DPT   Huprich,Jason 02/25/2015, 12:08 PM  [facility] PHYSICAL AND SPORTS MEDICINE [address], [place], [address] Phone: [phone]   Fax:  [phone]

## 2015-03-01 ENCOUNTER — Ambulatory Visit: Payer: Medicare Other | Admitting: Physical Therapy

## 2015-03-01 DIAGNOSIS — R0609 Other forms of dyspnea: Secondary | ICD-10-CM

## 2015-03-01 DIAGNOSIS — R531 Weakness: Secondary | ICD-10-CM

## 2015-03-01 NOTE — Therapy (Signed)
[facility] [address] [place], [address] Phone: [phone]   Fax:  [phone]  Physical Therapy Treatment  Patient Details  Name: Kara Pennington MRN: [id number] Date of Birth: July 27, 1940 Referring Provider:  Patrice Paradise, MD  Encounter Date: 03/01/2015      PT End of Session - 03/01/15 1156    Visit Number 6   Number of Visits 9   Date for PT Re-Evaluation 03/07/15   Authorization Type 6   Authorization Time Period 10   PT Start Time 1040   PT Stop Time 1125   PT Time Calculation (min) 45 min   Activity Tolerance Patient tolerated treatment well;No increased pain   Behavior During Therapy [facility] for tasks assessed/performed      Past Medical History  Diagnosis Date  . Anxiety   . Depression   . Hypertension     No past surgical history on file.  There were no vitals filed for this visit.  Visit Diagnosis:  Dyspnea on exertion  General weakness      Subjective Assessment - 03/01/15 1152    Subjective Patient reports she is still feeling a little fatigued since her trip. She felt good on her trip though, but realizes she still has a ways to go.    Limitations Walking;House hold activities   Patient Stated Goals To increase endurance, lose some weight.   Currently in Pain? No/denies                     Adult Aquatic Therapy - 03/01/15 1153    Aquatic Therapy Subjective   Subjective Reports she is feeling better.    Treatment   Gait Pt ambulated forward x5 laps (3 laps with increased speed), marching gait x 2 laps, tandem gait x 2 laps, sidestepping x 3 laps, backward x 3 laps..   Exercises Standing exercises to include: step ups x 20 bilaterally with handrail assist, marching, squats, hip abduction, hip extension,flexion and heel raises x 2 min each.    Specific Exercises Hip/Low Back   Hip/Low Back seated exercises to include  Laq, hip abd/add and bicyles x 2 min  each without back support.                    PT Education - 03/01/15 1155    Education provided Yes   Education Details proper form and technique   Person(s) Educated Patient   Methods Explanation;Demonstration   Comprehension Verbalized understanding;Returned demonstration             PT Long Term Goals - 02/25/15 1206    PT LONG TERM GOAL #1   Title Patient will be independent with HEP for improved mobility, endurance   Baseline lacking knowledge of exercises and how to improve endurance.    Time 4   Period Weeks   Status On-going   PT LONG TERM GOAL #2   Title Patient will improve disability demonstrated by improved modified oswestry score of < 10%   Baseline 26%, 02/24/15: 22%   Time 6   Period Weeks   Status On-going   PT LONG TERM GOAL #3   Title Patient will demonstrate improved endurance by completing the 6 MW test and walking more than 800'.   Baseline walked 600' in 4 min.    Time 6   Period Weeks   Status Achieved   PT LONG TERM GOAL #4   Title Patient  will demonstrate increased endurance and gait speed by completing and walking > 1200'   Baseline walked 900' in 4 min 40 sec   Time 3   Period Weeks   Status New               Plan - 03/01/15 1157    Clinical Impression Statement Patient reports feeling better than THursday, but still recovering some from trip. Pt wants to continue working on endurance and strength for improved ease of traveling.    Pt will benefit from skilled therapeutic intervention in order to improve on the following deficits Cardiopulmonary status limiting activity;Decreased activity tolerance;Decreased endurance;Decreased mobility;Decreased strength;Decreased balance   Rehab Potential Good   PT Frequency 2x / week   PT Duration 4 weeks   PT Treatment/Interventions Therapeutic exercise;Therapeutic activities;Balance training;Patient/family education;Aquatic Therapy   PT Next Visit Plan Progress aerobic  conditioning and LE strengthening. Reassess 6 MWT as appropriate.    PT Home Exercise Plan See patient instructions.    Consulted and Agree with Plan of Care Patient        Problem List There are no active problems to display for this patient.   Macee Venables, PT, MPT 03/01/2015, 11:59 AM  [facility] SPORTS MEDICINE [address], [place], [address] Phone: [phone]   Fax:  [phone]

## 2015-03-03 ENCOUNTER — Ambulatory Visit: Payer: Medicare Other | Admitting: Physical Therapy

## 2015-03-03 DIAGNOSIS — R531 Weakness: Secondary | ICD-10-CM

## 2015-03-03 DIAGNOSIS — R0609 Other forms of dyspnea: Secondary | ICD-10-CM

## 2015-03-03 NOTE — Therapy (Signed)
[place] [facility]TS [facility]IC[facility] [address] [place], [address] Phone: [phone]   Fax:  [phone]  Physical Therapy Treatment  Patient Details  Name: Kara Pennington MRN: [id number] Date of Birth: 04-14-1941 Referring Provider:  Patrice Paradise, MD  Encounter Date: 03/03/2015      PT End of Session - 03/03/15 1158    Visit Number 7   Number of Visits 9   Date for PT Re-Evaluation 03/07/15   Authorization Type 7   Authorization Time Period 10   PT Start Time 0915   PT Stop Time 1000   PT Time Calculation (min) 45 min   Activity Tolerance Patient tolerated treatment well;No increased pain   Behavior During Therapy [facility] for tasks assessed/performed      Past Medical History  Diagnosis Date  . Anxiety   . Depression   . Hypertension     No past surgical history on file.  There were no vitals filed for this visit.  Visit Diagnosis:  Dyspnea on exertion  General weakness      Subjective Assessment - 03/03/15 1156    Subjective Patient reports she is doing well. No reported pain.    Limitations Walking;House hold activities   Patient Stated Goals To increase endurance, lose some weight.   Currently in Pain? No/denies                     Adult Aquatic Therapy - 03/03/15 1156    Aquatic Therapy Subjective   Subjective Denies pain, reports she is well.    Treatment   Gait Pt ambulated forward x5 laps (3 laps with increased speed), marching gait x 2 laps, tandem gait x 2 laps, sidestepping x 3 laps, backward x 3 laps..   Exercises Standing exercises to include: using kickboard and kicking across pool x 2 laps, step ups x 20 bilaterally with handrail assist, marching, squats, hip abduction, hip extension,flexion and heel raises x 2 min each.    Specific Exercises Hip/Low Back   Hip/Low Back seated exercises to include  Laq, hip abd/add and bicyles x 2 min each without back support.                     PT Education - 03/03/15 1157    Education provided Yes   Education Details new exercises   Person(s) Educated Patient   Methods Explanation;Demonstration   Comprehension Verbalized understanding;Returned demonstration             PT Long Term Goals - 02/25/15 1206    PT LONG TERM GOAL #1   Title Patient will be independent with HEP for improved mobility, endurance   Baseline lacking knowledge of exercises and how to improve endurance.    Time 4   Period Weeks   Status On-going   PT LONG TERM GOAL #2   Title Patient will improve disability demonstrated by improved modified oswestry score of < 10%   Baseline 26%, 02/24/15: 22%   Time 6   Period Weeks   Status On-going   PT LONG TERM GOAL #3   Title Patient will demonstrate improved endurance by completing the 6 MW test and walking more than 800'.   Baseline walked 600' in 4 min.    Time 6   Period Weeks   Status Achieved   PT LONG TERM GOAL #4   Title Patient will demonstrate increased endurance and gait speed by completing and  walking > 1200'   Baseline walked 900' in 4 min 40 sec   Time 3   Period Weeks   Status New               Plan - 03/03/15 1158    Clinical Impression Statement Patient is making good progress with strength and endurance. Pt will be seen next visit in clinic.   Pt will benefit from skilled therapeutic intervention in order to improve on the following deficits Cardiopulmonary status limiting activity;Decreased activity tolerance;Decreased endurance;Decreased mobility;Decreased strength;Decreased balance   Rehab Potential Good   PT Frequency 2x / week   PT Duration 4 weeks   PT Treatment/Interventions Therapeutic exercise;Therapeutic activities;Balance training;Patient/family education;Aquatic Therapy   PT Next Visit Plan Progress aerobic conditioning and LE strengthening. Reassess 6 MWT as appropriate.    PT Home Exercise Plan See patient instructions.     Consulted and Agree with Plan of Care Patient        Problem List There are no active problems to display for this patient.   Kortney Schoenfelder, PT, MPT 03/03/2015, 12:00 PM  [place] [facility] PHYSICAL [facility] SPORTS MEDICINE [address] [place], [address] Phone: [phone]   Fax:  [phone]

## 2015-03-07 ENCOUNTER — Ambulatory Visit: Payer: Medicare Other | Admitting: Physical Therapy

## 2015-03-07 DIAGNOSIS — R0609 Other forms of dyspnea: Secondary | ICD-10-CM

## 2015-03-07 DIAGNOSIS — R531 Weakness: Secondary | ICD-10-CM

## 2015-03-07 NOTE — Therapy (Signed)
[place] [facility]TS MED[facility] [address] [place], [address] Phone: [phone]   Fax:  [phone]  Physical Therapy Treatment  Patient Details  Name: Kara Pennington MRN: [id number] Date of Birth: [birth year] Referring Provider:  Patrice Paradise, MD  Encounter Date: 03/07/2015      PT End of Session - 03/07/15 1428    Visit Number 8   Number of Visits 9   Date for PT Re-Evaluation 03/07/15   Authorization Type 8   Authorization Time Period 10   PT Start Time 1345   PT Stop Time 1430   PT Time Calculation (min) 45 min   Activity Tolerance Patient tolerated treatment well;No increased pain   Behavior During Therapy [facility] for tasks assessed/performed      Past Medical History  Diagnosis Date  . Anxiety   . Depression   . Hypertension     No past surgical history on file.  There were no vitals filed for this visit.  Visit Diagnosis:  Dyspnea on exertion  General weakness      Subjective Assessment - 03/07/15 1425    Subjective Patient reports no pain except for 1st thing in the morning. Continues to report decreased endurance however it has improved. Also reports leg fatigue when walking long distances.    Limitations Walking;House hold activities   Patient Stated Goals To increase endurance, lose some weight.   Currently in Pain? No/denies   Multiple Pain Sites No          Treatment: Nu step x 5 min level 3 results 1250' in 6 min Standing exercises with 3# weights bilaterally: HR, Hip Abd, marching, Laq,  Step ups x10 on 8" step STS without arms 2x10           PT Education - 03/07/15 1428    Education provided Yes   Education Details continued POC to work on endurance and LE strengthening.   Person(s) Educated Patient   Methods Explanation   Comprehension Verbalized understanding             PT Long Term Goals - 02/25/15 1206    PT LONG TERM GOAL #1   Title Patient will  be independent with HEP for improved mobility, endurance   Baseline lacking knowledge of exercises and how to improve endurance.    Time 4   Period Weeks   Status On-going   PT LONG TERM GOAL #2   Title Patient will improve disability demonstrated by improved modified oswestry score of < 10%   Baseline 26%, 02/24/15: 22%   Time 6   Period Weeks   Status On-going   PT LONG TERM GOAL #3   Title Patient will demonstrate improved endurance by completing the 6 MW test and walking more than 800'.   Baseline walked 600' in 4 min.    Time 6   Period Weeks   Status Achieved   PT LONG TERM GOAL #4   Title Patient will demonstrate increased endurance and gait speed by completing and walking > 1200'   Baseline walked 900' in 4 min 40 sec   Time 3   Period Weeks   Status New               Plan - 03/07/15 1432    Clinical Impression Statement Patient continues to make good progress with therapy. Her endurance has improved, however it is flet that she could benefit from another 4 weeks.  Pt will benefit from skilled therapeutic intervention in order to improve on the following deficits Cardiopulmonary status limiting activity;Decreased activity tolerance;Decreased endurance;Decreased mobility;Decreased strength;Decreased balance        Problem List There are no active problems to display for this patient.   Nairi Oswald, PT, MPT 03/07/2015, 5:24 PM   [facility] PHYS[facility] SPORTS MEDICINE [address], [place], [address] Phone: [phone]   Fax:  [phone]

## 2015-03-11 ENCOUNTER — Ambulatory Visit: Payer: Medicare Other | Attending: Physician Assistant | Admitting: Physical Therapy

## 2015-03-11 DIAGNOSIS — R531 Weakness: Secondary | ICD-10-CM | POA: Insufficient documentation

## 2015-03-11 DIAGNOSIS — R0609 Other forms of dyspnea: Secondary | ICD-10-CM | POA: Insufficient documentation

## 2015-03-15 ENCOUNTER — Ambulatory Visit: Payer: Medicare Other | Admitting: Physical Therapy

## 2015-03-15 NOTE — Therapy (Signed)
Patient came at 11:30 when scheduled for 10:00 appointment. Pt performed independent aquatic exercises for 45 min with supervision only.

## 2015-03-21 ENCOUNTER — Ambulatory Visit: Payer: Medicare Other | Admitting: Physical Therapy

## 2015-03-25 ENCOUNTER — Ambulatory Visit: Payer: Medicare Other | Admitting: Physical Therapy

## 2015-03-28 ENCOUNTER — Ambulatory Visit: Payer: Medicare Other | Admitting: Physical Therapy

## 2015-03-28 DIAGNOSIS — M5136 Other intervertebral disc degeneration, lumbar region: Secondary | ICD-10-CM

## 2015-03-28 DIAGNOSIS — R0609 Other forms of dyspnea: Secondary | ICD-10-CM

## 2015-03-28 DIAGNOSIS — R531 Weakness: Secondary | ICD-10-CM | POA: Diagnosis present

## 2015-03-28 NOTE — Therapy (Addendum)
[place] [facility]TS MEDIC[facility] [address], [place], [address] Phone: [phone]   Fax:  [phone]  Physical Therapy Treatment  Patient Details  Name: Kara Pennington MRN: [id number] Date of Birth: Nov 19, 1940 Referring Provider:  Patrice Paradise, MD  Encounter Date: 03/28/2015      PT End of Session - 03/28/15 1553    Visit Number 1   Number of Visits 9   Date for PT Re-Evaluation 04/25/15   Authorization Type 1   Authorization Time Period 10   PT Start Time 1530   PT Stop Time 1615   PT Time Calculation (min) 45 min   Activity Tolerance Patient tolerated treatment well;No increased pain;Patient limited by fatigue   Behavior During Therapy [facility] for tasks assessed/performed      Past Medical History  Diagnosis Date  . Anxiety   . Depression   . Hypertension     No past surgical history on file.  There were no vitals filed for this visit.  Visit Diagnosis:  General weakness - Plan: PT plan of care cert/re-cert  Dyspnea on exertion - Plan: PT plan of care cert/re-cert  DDD (degenerative disc disease), lumbar      Subjective Assessment - 03/28/15 1537    Subjective Patient has had a cold and is feeling a bit more fatigued than usual.     Limitations House hold activities;Walking   Patient Stated Goals To increase endurance, lose some weight.   Currently in Pain? No/denies       Treatment: Nu step level 3 x 6 min. For warm up and endurance training.  STS 2x10 without use of UEs. Patient reports 5/10 on dyspnea scale. Step ups on 8" step x 10 reps each side. Rest breaks needed between exercises.  Standing heel raises, hip abduction x 20 each.  Side stepping on blue foam x 6 reps of 6' without UE support as able, cues needed to take smaller steps and slow down for balance.  SLS, tandem stance x 1 min each for balance training. UE assist used as needed.  Backward walking x 2 laps of  100'             PT Education - 03/28/15 1539    Education provided Yes   Education Details continued need for therapy due to continued dyspnea on exertion.    Person(s) Educated Patient   Methods Explanation   Comprehension Verbalized understanding             PT Long Term Goals - 03/28/15 1711    PT LONG TERM GOAL #1   Title Patient will be independent with HEP for improved mobility, endurance   Baseline lacking knowledge of exercises and how to improve endurance.                Plan - 03/28/15 1554    Clinical Impression Statement Patient continues to have dyspnea on exertion and limitations with activity tolerance. Patient reports mild stiffness in back first thing in the morning.    Pt will benefit from skilled therapeutic intervention in order to improve on the following deficits Decreased activity tolerance   Rehab Potential Good   PT Frequency 2x / week   PT Duration 4 weeks   PT Treatment/Interventions Therapeutic exercise;Therapeutic activities;Balance training;Patient/family education;Aquatic Therapy   PT Next Visit Plan Progress aerobic conditioning and LE strengthening.    Consulted and Agree with Plan of Care Patient  G-Codes - 03/28/15 1558    Functional Assessment Tool Used 6 min walk, TUG, Oswestry, clinical judgement   Functional Limitation Mobility: Walking and moving around   Mobility: Walking and Moving Around Current Status [phone]) At least 20 percent but less than 40 percent impaired, limited or restricted   Mobility: Walking and Moving Around Goal Status [phone]) At least 1 percent but less than 20 percent impaired, limited or restricted      Problem List There are no active problems to display for this patient.   Conetta,Kristyn, PT, MPT, GCS 03/28/2015, 5:16 PM  [place] [facility] PHYSICAL [facility] SPORTS MEDICINE [address] [place], [address] Phone: [phone]   Fax:   [phone]

## 2015-04-01 ENCOUNTER — Ambulatory Visit: Payer: Medicare Other | Admitting: Physical Therapy

## 2015-04-01 DIAGNOSIS — M51369 Other intervertebral disc degeneration, lumbar region without mention of lumbar back pain or lower extremity pain: Secondary | ICD-10-CM

## 2015-04-01 DIAGNOSIS — R531 Weakness: Secondary | ICD-10-CM | POA: Diagnosis not present

## 2015-04-01 DIAGNOSIS — R0609 Other forms of dyspnea: Secondary | ICD-10-CM

## 2015-04-01 DIAGNOSIS — M5136 Other intervertebral disc degeneration, lumbar region: Secondary | ICD-10-CM

## 2015-04-01 NOTE — Therapy (Addendum)
[place] [facility] PHYS[facility]TS MEDICINE [address], [place], [address] Phone: [phone]   Fax:  [phone]  Physical Therapy Treatment  Patient Details  Name: Kara Pennington MRN: [id number] Date of Birth: Aug 25, 1940 Referring Provider:  Patrice Paradise, MD  Encounter Date: 04/01/2015      PT End of Session - 04/01/15 1225    Visit Number 2   Number of Visits 9   Date for PT Re-Evaluation 04/25/15   Authorization Type 2   Authorization Time Period 10   PT Start Time 1035   PT Stop Time 1115   PT Time Calculation (min) 40 min   Activity Tolerance Patient tolerated treatment well;No increased pain;Patient limited by fatigue   Behavior During Therapy [facility] for tasks assessed/performed      Past Medical History  Diagnosis Date  . Anxiety   . Depression   . Hypertension     No past surgical history on file.  There were no vitals filed for this visit.  Visit Diagnosis:  General weakness  Dyspnea on exertion  DDD (degenerative disc disease), lumbar      Subjective Assessment - 04/01/15 1223    Subjective Patient reports she is tired. States she just woke up from a nap, had a houseguest.   Limitations House hold activities;Walking   Patient Stated Goals To increase endurance, lose some weight.   Currently in Pain? No/denies      Treatment: Nu step level 3 x 8 min. For warm up and endurance training.  STS 2x10 without use of UEs. Patient reports 2/10 on dyspnea scale after first set. Step ups on 8" step x 10 reps each side. Rest breaks needed between exercises.  Standing heel raises, hip abduction x 20 each.  Side stepping on blue foam x 6 reps of 6' without UE support as able, cues needed to take smaller steps and slow down for balance.  SLS, narrow stance on blue foam, 1 min each for balance training. UE assist used as needed.           PT Education - 04/01/15 1224    Education provided Yes   Education Details exercises, proper form    Person(s) Educated Patient   Methods Explanation;Demonstration   Comprehension Verbalized understanding;Returned demonstration             PT Long Term Goals - 03/28/15 1711    PT LONG TERM GOAL #1   Title Patient will be independent with HEP for improved mobility, endurance   Baseline lacking knowledge of exercises and how to improve endurance.                Plan - 04/01/15 1226    Clinical Impression Statement Patient continues to have dyspnea on exertion and is deconditioned.    Pt will benefit from skilled therapeutic intervention in order to improve on the following deficits Decreased activity tolerance;Decreased balance   Rehab Potential Good   PT Frequency 2x / week   PT Duration 4 weeks   PT Treatment/Interventions Therapeutic exercise;Therapeutic activities;Balance training;Patient/family education;Aquatic Therapy   PT Next Visit Plan Progress aerobic conditioning and LE strengthening.    PT Home Exercise Plan See patient instructions.    Consulted and Agree with Plan of Care Patient        Problem List There are no active problems to display for this patient.   Paullette Mckain, PT, MPT, GCS 04/01/2015, 12:29 PM  [place] [facility]  PHYSICAL AND SPORTS MEDICINE [address] [place], [address] Phone: [phone]   Fax:  [phone]

## 2015-04-04 ENCOUNTER — Ambulatory Visit: Payer: Medicare Other | Admitting: Physical Therapy

## 2015-04-04 DIAGNOSIS — R0609 Other forms of dyspnea: Secondary | ICD-10-CM

## 2015-04-04 DIAGNOSIS — R531 Weakness: Secondary | ICD-10-CM | POA: Diagnosis not present

## 2015-04-04 DIAGNOSIS — M5136 Other intervertebral disc degeneration, lumbar region: Secondary | ICD-10-CM

## 2015-04-04 NOTE — Therapy (Addendum)
[place] [facility] PHYSICAL [facility]ORTS MEDICINE [address] [place], [address] Phone: [phone]   Fax:  [phone]  Physical Therapy Treatment  Patient Details  Name: Kara Pennington MRN: [id number] Date of Birth: [birth year] Referring Provider:  Patrice Paradise, MD  Encounter Date: 04/04/2015      PT End of Session - 04/04/15 1236    Visit Number 3   Number of Visits 9   Date for PT Re-Evaluation 04/25/15   Authorization Type 3   Authorization Time Period 10   PT Start Time 1103   PT Stop Time 1143   PT Time Calculation (min) 40 min   Activity Tolerance Patient tolerated treatment well;No increased pain;Patient limited by fatigue   Behavior During Therapy [facility] for tasks assessed/performed      Past Medical History  Diagnosis Date  . Anxiety   . Depression   . Hypertension     No past surgical history on file.  There were no vitals filed for this visit.  Visit Diagnosis:  General weakness  Dyspnea on exertion  DDD (degenerative disc disease), lumbar      Subjective Assessment - 04/04/15 1552    Subjective Patient reports she has been feeling a significant amount of improvement with PT services thus far. She is reporting less fatigue/shortness of breath.    Limitations House hold activities;Walking   How long can you stand comfortably? 30-45 minutes   How long can you walk comfortably? 30 minutes   Patient Stated Goals To increase endurance, lose some weight.   Currently in Pain? No/denies      Heel raises x 20  Mini squats with HHA x 10 with anterior knee displacement, cuing for posterior hip "hinging" 1/10 fatigue after 10, 3/10 fatigue after second set of 10 with posterior hinging.   Stair ascent/descent with PRN hand utilization for balance assist x 2 rounds for single leg balance and endurance/power training 4 steps each round   30 seconds of step ups x 13 on LLE, 16 on RLE, with cuing to perform as quickly  as possible. On second round 16 on LLE, 17 on RLE (rated as 5/10 exertion).   Sit to stand with DB x  2# for 5 repetitions, 11.25 seconds for 5x sit to stand with 2# DB, several minutes rest prior to last set 10.22 seconds for 5x sit to stand. Dyspnea 6/10 (walked 30' x 3 laps to relieve)   Curl Presses in sitting position for chest expansion with 2# DB x 10 repetitions for 2 sets   OMEGA rows x 10# with appropriate technique to improve "chest open posture" x12 repes (medium), x 15# (hard) for 10 reps, x 12 repetitions at 10#                              PT Education - 04/04/15 1231    Education provided Yes   Education Details Rationale for interval training, progress with sit to stands.    Person(s) Educated Patient   Methods Explanation;Demonstration   Comprehension Verbalized understanding;Returned demonstration             PT Long Term Goals - 03/28/15 1711    PT LONG TERM GOAL #1   Title Patient will be independent with HEP for improved mobility, endurance   Baseline lacking knowledge of exercises and how to improve endurance.  Plan - 04/04/15 1238    Clinical Impression Statement Patient continues to have dyspnea with interval exercise programming, though she is now able to perform sit to stands with a weight in her hands (initially had to use bilateral UEs). Patient also demonstrating increased resilience (less time required to regain her breath). Patient would continue to benefit from interval training to increase her cardiopulmonary tolerance for mobility and increase her endurance.    Pt will benefit from skilled therapeutic intervention in order to improve on the following deficits Decreased activity tolerance;Decreased balance   Rehab Potential Good   PT Frequency 2x / week   PT Duration 4 weeks   PT Treatment/Interventions Therapeutic exercise;Therapeutic activities;Balance training;Patient/family education;Aquatic Therapy    PT Next Visit Plan Progress aerobic conditioning and LE strengthening.    PT Home Exercise Plan Maintained   Consulted and Agree with Plan of Care Patient        Problem List There are no active problems to display for this patient.   Kerin Ransom, PT, DPT    04/04/2015, 4:00 PM  [place] [facility] PHYSICAL AND SPORTS MEDICINE [address], [place], [address] Phone: [phone]   Fax:  [phone]

## 2015-04-08 ENCOUNTER — Ambulatory Visit: Payer: Medicare Other | Admitting: Physical Therapy

## 2015-04-11 ENCOUNTER — Ambulatory Visit: Payer: Medicare Other | Attending: Physician Assistant | Admitting: Physical Therapy

## 2015-04-11 DIAGNOSIS — M5136 Other intervertebral disc degeneration, lumbar region: Secondary | ICD-10-CM | POA: Insufficient documentation

## 2015-04-11 DIAGNOSIS — R531 Weakness: Secondary | ICD-10-CM | POA: Insufficient documentation

## 2015-04-11 DIAGNOSIS — R0609 Other forms of dyspnea: Secondary | ICD-10-CM

## 2015-04-11 NOTE — Therapy (Signed)
[place] [facility] PHYSICAL [facility]ORTS MEDICINE [address], [place], [address] Phone: [phone]   Fax:  [phone]  Physical Therapy Treatment  Patient Details  Name: Kara Pennington MRN: [id number] Date of Birth: 07-26-1940 Referring Provider:  Patrice Paradise, MD  Encounter Date: 04/11/2015      PT End of Session - 04/11/15 1621    Visit Number 4   Number of Visits 9   Date for PT Re-Evaluation 04/25/15   Authorization Type 4   Authorization Time Period 10   PT Start Time 1445   PT Stop Time 1525   PT Time Calculation (min) 40 min   Activity Tolerance Patient tolerated treatment well;Patient limited by fatigue   Behavior During Therapy [facility] for tasks assessed/performed      Past Medical History  Diagnosis Date  . Anxiety   . Depression   . Hypertension     No past surgical history on file.  There were no vitals filed for this visit.  Visit Diagnosis:  General weakness  Dyspnea on exertion      Subjective Assessment - 04/11/15 1619    Subjective Patient reports she feels like she is loosing her mind. She forgot to come for her appointment on Friday.    Limitations House hold activities;Walking   Patient Stated Goals To increase endurance, lose some weight.   Currently in Pain? No/denies        Treatment: Sit to stand with 3# weights x10 without weights x 10 Nu step level 3 x 5 min Heel raises x 20 Sidestepping and wedding march x 200' with 3#  Step ups x 10 bilaterally Lateral walking on foam x 2 min.   Cues for proper form, sba for safety, seated rest breaks given between exercises as needed for dyspnea.       PT Education - 04/11/15 1620    Education provided Yes   Education Details exercises, to rest when needed.    Person(s) Educated Patient   Methods Explanation   Comprehension Verbalized understanding             PT Long Term Goals - 03/28/15 1711    PT LONG TERM GOAL #1   Title  Patient will be independent with HEP for improved mobility, endurance   Baseline lacking knowledge of exercises and how to improve endurance.                Plan - 04/11/15 1622    Clinical Impression Statement Patient continues to have dyspnea on exertion during therapy.    Pt will benefit from skilled therapeutic intervention in order to improve on the following deficits Decreased activity tolerance;Decreased balance   Rehab Potential Good   PT Frequency 2x / week   PT Duration 4 weeks   PT Treatment/Interventions Therapeutic exercise;Therapeutic activities;Balance training;Patient/family education;Aquatic Therapy   PT Next Visit Plan Progress aerobic conditioning and LE strengthening.    Consulted and Agree with Plan of Care Patient        Problem List There are no active problems to display for this patient.   Samira Acero, PT, MPT, GCS 04/11/2015, 4:24 PM  [place] [facility] PHYSICAL AND SPORTS MEDICINE [address] [place], [address] Phone: [phone]   Fax:  [phone]

## 2015-04-15 ENCOUNTER — Ambulatory Visit: Payer: Medicare Other | Admitting: Physical Therapy

## 2015-04-18 ENCOUNTER — Ambulatory Visit: Payer: Medicare Other | Admitting: Physical Therapy

## 2015-04-18 DIAGNOSIS — R531 Weakness: Secondary | ICD-10-CM | POA: Diagnosis not present

## 2015-04-18 DIAGNOSIS — R0609 Other forms of dyspnea: Secondary | ICD-10-CM

## 2015-04-18 NOTE — Therapy (Signed)
[place] [facility] PHYSICAL [facility] SPORTS MEDICINE [address] [place], [address] Phone: [phone]   Fax:  [phone]  Physical Therapy Treatment  Patient Details  Name: Kara Pennington MRN: [id number] Date of Birth: [birth year] Referring Provider:  Patrice Paradise, MD  Encounter Date: 04/18/2015      PT End of Session - 04/18/15 1627    Visit Number 5   Number of Visits 9   Date for PT Re-Evaluation 04/25/15   Authorization Type 5   Authorization Time Period 10   PT Start Time 1500   PT Stop Time 1540   PT Time Calculation (min) 40 min   Activity Tolerance Patient tolerated treatment well;No increased pain;Patient limited by fatigue   Behavior During Therapy [facility] for tasks assessed/performed      Past Medical History  Diagnosis Date  . Anxiety   . Depression   . Hypertension     No past surgical history on file.  There were no vitals filed for this visit.  Visit Diagnosis:  General weakness  Dyspnea on exertion      Subjective Assessment - 04/18/15 1626    Subjective Patient reports she came at 2:45 on Friday. She reports she has been well.    Limitations House hold activities;Walking   Patient Stated Goals To increase endurance, lose some weight.   Currently in Pain? No/denies         Treatment: Sit to stand with 3# weights 2 x10 without weights  Nu step level 3 x 8 min Heel raises x 20 Sidestepping and wedding march x 200' with 3#  Step ups x 10 bilaterally Lateral walking on foam x 2 min.   Cues for proper form, sba for safety, seated rest breaks given between exercises as needed for dyspnea.         PT Education - 04/18/15 1627    Education provided Yes   Education Details proper form with exercises   Person(s) Educated Patient   Methods Explanation   Comprehension Verbalized understanding             PT Long Term Goals - 03/28/15 1711    PT LONG TERM GOAL #1   Title Patient will be  independent with HEP for improved mobility, endurance   Baseline lacking knowledge of exercises and how to improve endurance.                Plan - 04/18/15 1628    Clinical Impression Statement Patient continues to have SOB on exertion. She intermittently missess scheduled appointments therefore she is not making as much progress as she could be.    Pt will benefit from skilled therapeutic intervention in order to improve on the following deficits Decreased activity tolerance;Decreased balance   Rehab Potential Good   PT Frequency 2x / week   PT Duration 4 weeks   PT Treatment/Interventions Therapeutic exercise;Therapeutic activities;Balance training;Patient/family education;Aquatic Therapy   PT Next Visit Plan Progress aerobic conditioning and LE strengthening.    Consulted and Agree with Plan of Care Patient        Problem List There are no active problems to display for this patient.   Lanell Carpenter, PT, MPT, GCS 04/18/2015, 4:31 PM  [place] [facility] PHYSICAL [facility] SPORTS MEDICINE [address] [place], [address] Phone: [phone]   Fax:  [phone]

## 2015-04-22 ENCOUNTER — Ambulatory Visit: Payer: Medicare Other | Admitting: Physical Therapy

## 2015-04-22 DIAGNOSIS — M5136 Other intervertebral disc degeneration, lumbar region: Secondary | ICD-10-CM

## 2015-04-22 DIAGNOSIS — R531 Weakness: Secondary | ICD-10-CM

## 2015-04-22 DIAGNOSIS — R0609 Other forms of dyspnea: Secondary | ICD-10-CM

## 2015-04-22 NOTE — Therapy (Signed)
[place] [facility] MEDICINE [name] [name], [place], [address] Phone: [phone]   Fax:  [phone]  Physical Therapy Treatment  Patient Details  Name: Kara Pennington MRN: [id number] Date of Birth: [birth year] No Data Recorded  Encounter Date: 04/22/2015      PT End of Session - 04/22/15 1131    Visit Number 6   Number of Visits 9   Date for PT Re-Evaluation 04/25/15   Authorization Type 6   Authorization Time Period 10   PT Start Time 1100   PT Stop Time 1145   PT Time Calculation (min) 45 min   Activity Tolerance Patient tolerated treatment well;No increased pain;Patient limited by fatigue;Patient limited by lethargy   Behavior During Therapy [facility] for tasks assessed/performed      Past Medical History  Diagnosis Date  . Anxiety   . Depression   . Hypertension     No past surgical history on file.  There were no vitals filed for this visit.  Visit Diagnosis:  General weakness  Dyspnea on exertion  DDD (degenerative disc disease), lumbar      Subjective Assessment - 04/22/15 1118    Subjective Patient reports she did not sleep much last night, feeling stiff.    Limitations House hold activities;Walking   Patient Stated Goals To increase endurance, lose some weight.   Currently in Pain? No/denies         Treatment: Sit to stand with 3# weights 2 x10   Nu step level 3 x 8 min Heel raises x 20 Resisted walking in all directions with single black band x 5 reps each.   Standing hip abd/hip extension and marching x 20 reps each. Pt has fatigue with standing exercises.  Cues for proper form, sba for safety, seated rest breaks given between exercises as needed for dyspnea          PT Education - 04/22/15 1131    Education provided Yes   Education Details new exercises, proper technique.    Person(s) Educated Patient   Methods Explanation;Demonstration   Comprehension Verbalized  understanding;Returned demonstration             PT Long Term Goals - 03/28/15 1711    PT LONG TERM GOAL #1   Title Patient will be independent with HEP for improved mobility, endurance   Baseline lacking knowledge of exercises and how to improve endurance.                Plan - 04/22/15 1142    Clinical Impression Statement Patient continues to have SOB on exertion and with activities during session. Frequent rest breaks required. Pt would benefit from consistent exercise.   Pt will benefit from skilled therapeutic intervention in order to improve on the following deficits Decreased activity tolerance;Decreased balance   Rehab Potential Good   PT Frequency 2x / week   PT Duration 4 weeks   PT Treatment/Interventions Therapeutic exercise;Therapeutic activities;Balance training;Patient/family education;Aquatic Therapy   PT Next Visit Plan Progress aerobic conditioning and LE strengthening.    Consulted and Agree with Plan of Care Patient        Problem List There are no active problems to display for this patient.   [name], PT, MPT, GCS 04/22/2015, 11:45 AM  [place] [facility]YS[facility]TS MEDICINE [address] [place], [place], [address] Phone: [phone]   Fax:  [phone]  Name: Kara Pennington MRN: [id number] Date of Birth:  03/13/1941     

## 2015-04-25 ENCOUNTER — Ambulatory Visit: Payer: Medicare Other | Admitting: Physical Therapy

## 2015-04-29 ENCOUNTER — Ambulatory Visit: Payer: Medicare Other | Admitting: Physical Therapy

## 2015-04-29 DIAGNOSIS — R0609 Other forms of dyspnea: Secondary | ICD-10-CM

## 2015-04-29 DIAGNOSIS — R531 Weakness: Secondary | ICD-10-CM | POA: Diagnosis not present

## 2015-04-29 NOTE — Therapy (Signed)
[place] [facility] PHYSICAL [facility] SPORTS MEDICINE [address] [place], [address] Phone: [phone]   Fax:  [phone]  Physical Therapy Treatment  Patient Details  Name: Kara Pennington MRN: [id number] Date of Birth: [birth year] No Data Recorded  Encounter Date: 04/29/2015      PT End of Session - 04/29/15 1232    Visit Number 1   Number of Visits 9   Date for PT Re-Evaluation 05/27/15   Authorization Type 7   Authorization Time Period 10   PT Start Time 1150   PT Stop Time 1230   PT Time Calculation (min) 40 min   Activity Tolerance Patient tolerated treatment well;No increased pain;Patient limited by fatigue   Behavior During Therapy [facility] for tasks assessed/performed      Past Medical History  Diagnosis Date  . Anxiety   . Depression   . Hypertension     No past surgical history on file.  There were no vitals filed for this visit.  Visit Diagnosis:  General weakness - Plan: PT plan of care cert/re-cert  Dyspnea on exertion - Plan: PT plan of care cert/re-cert      Subjective Assessment - 04/29/15 1230    Subjective Patient reports she is tired, she drove back from her daughters yesterday which was 5 hours. Feels like she is more short of breath than she had been.   Limitations House hold activities;Walking   How long can you walk comfortably? 30 minutes   Patient Stated Goals To increase endurance, lose some weight.   Currently in Pain? No/denies          Treatment: Sit to stand with 3# weights 2 x10  Nu step level 3 x 6 min Seated hip adduction with ball x 20, hip abduction with green TB x 20. Heel raises x 20, marching on blue foam 2x 30 sec with UE support for balance. Resisted walking in all directions with single black band x 5 reps each.  Step ups on 6" step x 10 each side.  Standing hip abd/hip extension and marching x 20 reps each. Pt has fatigue with standing exercises.  Cues for proper form, sba for  safety, seated rest breaks given between exercises as needed for dyspnea         PT Education - 04/29/15 1232    Education provided Yes   Education Details proper form with activities, safety.   Person(s) Educated Patient   Methods Explanation   Comprehension Verbalized understanding             PT Long Term Goals - 04/29/15 1235    PT LONG TERM GOAL #2   Title Patient will improve disability demonstrated by improved modified oswestry score of < 10%   Baseline 26%, 02/24/15: 22%, 20% 03/28/15   Time 4   Period Weeks   Status On-going   PT LONG TERM GOAL #5   Title Patient will demonstrate improved balance for increased independence and safety with mobility by demonstrating SLS of 10 sec   Baseline 5 sec   Time 4   Period Weeks   Status On-going               Plan - 04/29/15 1234    Clinical Impression Statement Patient has made progress but continues to have SOB with activity. She would benefit from regular exercise like walking, which we discussed.    Pt will benefit from skilled therapeutic intervention in order to improve on the  following deficits Decreased activity tolerance;Decreased balance   Rehab Potential Good   PT Frequency 2x / week   PT Duration 4 weeks   PT Treatment/Interventions Therapeutic exercise;Therapeutic activities;Balance training;Patient/family education;Aquatic Therapy   PT Next Visit Plan Progress aerobic conditioning and LE strengthening.    Consulted and Agree with Plan of Care Patient          G-Codes - 04/29/15 1236    Functional Assessment Tool Used 6 min walk, TUG, Oswestry, clinical judgement   Functional Limitation Mobility: Walking and moving around   Mobility: Walking and Moving Around Current Status ([id number]) At least 20 percent but less than 40 percent impaired, limited or restricted   Mobility: Walking and Moving Around Goal Status [phone](G8979) At least 1 percent but less than 20 percent impaired, limited or restricted       Problem List There are no active problems to display for this patient.   Hodan Wurtz, PT, MPT, GCS 04/29/2015, 12:39 PM  [place] [facility] PHYS[facility]TS MEDICINE [name] [place], [place], [address] Phone: [phone]   Fax:  [phone]  Name: Kara Pennington MRN: [id number] Date of Birth: 12-31-1940

## 2015-05-02 ENCOUNTER — Ambulatory Visit: Payer: Medicare Other | Admitting: Physical Therapy

## 2015-05-02 DIAGNOSIS — R0609 Other forms of dyspnea: Secondary | ICD-10-CM

## 2015-05-02 DIAGNOSIS — R531 Weakness: Secondary | ICD-10-CM

## 2015-05-02 NOTE — Therapy (Signed)
[facility] [address], [place], [address] Phone: [phone]   Fax:  [phone]  Physical Therapy Treatment  Patient Details  Name: Kara Pennington MRN: [id number] Date of Birth: [birth year] No Data Recorded  Encounter Date: 05/02/2015      PT End of Session - 05/02/15 1344    Visit Number 2   Number of Visits 9   Date for PT Re-Evaluation 05/27/15   Authorization Type 2   Authorization Time Period 10   PT Start Time 1300   PT Stop Time 1345   PT Time Calculation (min) 45 min   Activity Tolerance Patient tolerated treatment well;No increased pain;Patient limited by fatigue   Behavior During Therapy [facility] for tasks assessed/performed      Past Medical History  Diagnosis Date  . Anxiety   . Depression   . Hypertension     No past surgical history on file.  There were no vitals filed for this visit.  Visit Diagnosis:  General weakness  Dyspnea on exertion      Subjective Assessment - 05/02/15 1309    Subjective Patient reports she has been running around all morning. Patient is oob upon arrival.    Limitations Walking;House hold activities   Patient Stated Goals To increase endurance, lose some weight.   Currently in Pain? No/denies         Treatment: Sit to stand with 3# weights 2 x10  Nu step level 3 x 6 min  hip abduction with green TB x 20. Heel raises x 20, marching on blue foam 2x 30 sec with UE support for balance. Resisted walking in all directions with single black band x 5 reps each.  Up and down steps without handrails x 4 reps ( 4 steps)   Pt has fatigue with standing exercises.  Cues for proper form, sba for safety, seated rest breaks given between exercises as needed for dyspnea           PT Education - 05/02/15 1343    Education provided Yes   Education Details proper form with exercises   Person(s) Educated Patient   Methods Explanation;Demonstration    Comprehension Verbalized understanding;Returned demonstration             PT Long Term Goals - 04/29/15 1235    PT LONG TERM GOAL #2   Title Patient will improve disability demonstrated by improved modified oswestry score of < 10%   Baseline 26%, 02/24/15: 22%, 20% 03/28/15   Time 4   Period Weeks   Status On-going   PT LONG TERM GOAL #5   Title Patient will demonstrate improved balance for increased independence and safety with mobility by demonstrating SLS of 10 sec   Baseline 5 sec   Time 4   Period Weeks   Status On-going               Plan - 05/02/15 1345    Clinical Impression Statement Patient is making good progress with strength and continues to require work on endruance.   Pt will benefit from skilled therapeutic intervention in order to improve on the following deficits Decreased activity tolerance;Decreased balance   Rehab Potential Good   PT Frequency 2x / week   PT Duration 4 weeks   PT Treatment/Interventions Therapeutic exercise;Therapeutic activities;Balance training;Patient/family education;Aquatic Therapy   PT Next Visit Plan Progress aerobic conditioning and LE strengthening.    PT Home Exercise Plan Maintained   Consulted  and Agree with Plan of Care Patient        Problem List There are no active problems to display for this patient.   [name], PT, MPT, GCS 05/02/2015, 2:29 PM  [facility] [name] [place], [place], [address] Phone: [phone]   Fax:  [phone]  Name: Kara Pennington MRN: [id number] Date of Birth: [birth year]

## 2015-05-06 ENCOUNTER — Ambulatory Visit: Payer: Medicare Other | Admitting: Physical Therapy

## 2015-05-09 ENCOUNTER — Ambulatory Visit: Payer: Medicare Other | Admitting: Physical Therapy

## 2015-05-13 ENCOUNTER — Ambulatory Visit: Payer: Medicare Other | Attending: Physician Assistant | Admitting: Physical Therapy

## 2015-05-13 DIAGNOSIS — R0609 Other forms of dyspnea: Secondary | ICD-10-CM | POA: Diagnosis present

## 2015-05-13 DIAGNOSIS — R531 Weakness: Secondary | ICD-10-CM | POA: Diagnosis present

## 2015-05-13 NOTE — Therapy (Signed)
[facility] [address] [place], [address] Phone: [phone]   Fax:  [phone]  Physical Therapy Treatment  Patient Details  Name: GLADIS SOLEY MRN: [id number] Date of Birth: 12/29/1940 No Data Recorded  Encounter Date: 05/13/2015      PT End of Session - 05/13/15 1144    Visit Number 3   Number of Visits 9   Date for PT Re-Evaluation 05/27/15   Authorization Type 3   Authorization Time Period 10   PT Start Time 1105   PT Stop Time 1145   PT Time Calculation (min) 40 min   Activity Tolerance Patient tolerated treatment well;No increased pain;Patient limited by fatigue   Behavior During Therapy [facility] for tasks assessed/performed      Past Medical History  Diagnosis Date  . Anxiety   . Depression   . Hypertension     No past surgical history on file.  There were no vitals filed for this visit.  Visit Diagnosis:  Dyspnea on exertion  General weakness      Subjective Assessment - 05/13/15 1143    Subjective Patient reports she is tired.    Limitations Walking;House hold activities   How long can you stand comfortably? 30-45 minutes   How long can you walk comfortably? 30 minutes   Patient Stated Goals To increase endurance, lose some weight.   Currently in Pain? No/denies         Treatment: Sit to stand with 3# weights 2 x10  Nu step level 3 x 6 min seated hip abduction with red TB x 20. Standing Heel raises x 20, Hip abduction x 20  Resisted walking in all directions with single black band x 5 reps each. Cues and sba needed for proper technique and safety.  Forward and lateral step ups on 7" step x 10 bilaterally.   Pt has fatigue with standing exercises.  Cues for proper form, sba for safety, seated rest breaks given between exercises as needed for dyspnea             PT Education - 05/13/15 1144    Education provided Yes   Education Details exercises,  proper form and reasoning   Person(s) Educated Patient   Methods Explanation   Comprehension Verbalized understanding             PT Long Term Goals - 04/29/15 1235    PT LONG TERM GOAL #2   Title Patient will improve disability demonstrated by improved modified oswestry score of < 10%   Baseline 26%, 02/24/15: 22%, 20% 03/28/15   Time 4   Period Weeks   Status On-going   PT LONG TERM GOAL #5   Title Patient will demonstrate improved balance for increased independence and safety with mobility by demonstrating SLS of 10 sec   Baseline 5 sec   Time 4   Period Weeks   Status On-going               Plan - 05/13/15 1151    Clinical Impression Statement Patient is making good progress with strength and endurance and will benefit from continued edurance and strength training.    Pt will benefit from skilled therapeutic intervention in order to improve on the following deficits Decreased activity tolerance;Decreased balance;Decreased strength   Rehab Potential Good   PT Frequency 2x / week   PT Duration 4 weeks   PT Treatment/Interventions Therapeutic exercise;Therapeutic activities;Balance training;Patient/family education;Aquatic Therapy  PT Next Visit Plan Progress aerobic conditioning and LE strengthening.    Consulted and Agree with Plan of Care Patient        Problem List There are no active problems to display for this patient.   Beuna Bolding, PT, MPT, GCS 05/13/2015, 11:53 AM  [facility] [name] [place], [place], [address] Phone: [phone]   Fax:  [phone]  Name: Saralyn PilarMyra S Steele MRN: [id number] Date of Birth: 09-04-1940

## 2015-05-16 ENCOUNTER — Ambulatory Visit: Payer: Medicare Other | Admitting: Physical Therapy

## 2015-05-16 DIAGNOSIS — R531 Weakness: Secondary | ICD-10-CM

## 2015-05-16 DIAGNOSIS — R0609 Other forms of dyspnea: Secondary | ICD-10-CM

## 2015-05-16 NOTE — Therapy (Signed)
[facility] [address] [place], [place], [address] Phone: [phone]   Fax:  [phone]  Physical Therapy Treatment  Patient Details  Name: Kara Pennington MRN: [id number] Date of Birth: [birth year] No Data Recorded  Encounter Date: 05/16/2015      PT End of Session - 05/16/15 1757    Visit Number 4   Number of Visits 9   Date for PT Re-Evaluation 05/27/15   Authorization Type 4   Authorization Time Period 10   PT Start Time 1440   PT Stop Time 1515   PT Time Calculation (min) 35 min   Activity Tolerance Patient tolerated treatment well;No increased pain;Other (comment)  limited by endurance   [facility] for tasks assessed/performed      Past Medical History  Diagnosis Date  . Anxiety   . Depression   . Hypertension     No past surgical history on file.  There were no vitals filed for this visit.  Visit Diagnosis:  Dyspnea on exertion  General weakness      Subjective Assessment - 05/16/15 1755    Subjective Patient reports she is well. Had a good weekend.    Limitations Walking;House hold activities   Patient Stated Goals To increase endurance, lose some weight.   Currently in Pain? No/denies         Treatment: Sit to stand with 3# weights 2 x10  Nu step level 3 x 6 min for endurance  Standing Heel raises x 20, Hip abduction x 20  Resisted walking in all directions with single black band x 5 reps each. Cues and sba needed for proper technique and safety.  Forward and lateral step ups on 7" step x 10 bilaterally. Rest breaks taken between sets Total gym squats x 20 reps, close supervision and cues getting on and off machine.  Pt has fatigue with standing exercises.  Cues for proper form, sba for safety, seated rest breaks given between exercises as needed for dyspnea          PT Education - 05/16/15 1756    Education provided Yes   Education Details  new activities, proper form with activities   Person(s) Educated Patient   Methods Explanation;Verbal cues   Comprehension Verbalized understanding;Returned demonstration             PT Long Term Goals - 04/29/15 1235    PT LONG TERM GOAL #2   Title Patient will improve disability demonstrated by improved modified oswestry score of < 10%   Baseline 26%, 02/24/15: 22%, 20% 03/28/15   Time 4   Period Weeks   Status On-going   PT LONG TERM GOAL #5   Title Patient will demonstrate improved balance for increased independence and safety with mobility by demonstrating SLS of 10 sec   Baseline 5 sec   Time 4   Period Weeks   Status On-going               Plan - 05/16/15 1758    Clinical Impression Statement patient continues to require frequent rest breaks during session due to decreased endurance.    Pt will benefit from skilled therapeutic intervention in order to improve on the following deficits Decreased activity tolerance;Decreased balance;Decreased strength   Rehab Potential Good   PT Frequency 2x / week   PT Duration 4 weeks   PT Treatment/Interventions Therapeutic exercise;Therapeutic activities;Balance training;Patient/family education;Aquatic Therapy   PT Next Visit Plan Progress  aerobic conditioning and LE strengthening.    Consulted and Agree with Plan of Care Patient        Problem List There are no active problems to display for this patient.   Mccrae Speciale, PT, MPT, GCS 05/16/2015, 6:01 PM  [place] [facility] [address], [place], [address] Phone: [phone]   Fax:  [phone]  Name: Kara Pennington MRN: [id number] Date of Birth: June 11, 1941

## 2015-05-20 ENCOUNTER — Ambulatory Visit: Payer: Medicare Other | Admitting: Physical Therapy

## 2015-05-23 ENCOUNTER — Ambulatory Visit: Payer: Medicare Other | Admitting: Physical Therapy

## 2015-05-23 DIAGNOSIS — R0609 Other forms of dyspnea: Secondary | ICD-10-CM

## 2015-05-23 DIAGNOSIS — R531 Weakness: Secondary | ICD-10-CM

## 2015-05-23 NOTE — Therapy (Signed)
[facility] [address], [place], [address] Phone: [phone]   Fax:  [phone]  Physical Therapy Treatment  Patient Details  Name: [name] MRN: [id number] Date of Birth: 24-Apr-1941 No Data Recorded  Encounter Date: 05/23/2015      PT End of Session - 05/23/15 1610    Visit Number 5   Number of Visits 9   Date for PT Re-Evaluation 05/27/15   Authorization Type 5   Authorization Time Period 10   PT Start Time 1437   PT Stop Time 1515   PT Time Calculation (min) 38 min   Activity Tolerance Patient tolerated treatment well;No increased pain;Patient limited by fatigue   Behavior During Therapy [facility] for tasks assessed/performed      Past Medical History  Diagnosis Date  . Anxiety   . Depression   . Hypertension     No past surgical history on file.  There were no vitals filed for this visit.  Visit Diagnosis:  Dyspnea on exertion  General weakness      Subjective Assessment - 05/23/15 1609    Subjective Patient reports she is doing well.    Limitations Walking;House hold activities   How long can you stand comfortably? 30-45 minutes   How long can you walk comfortably? 30 minutes   Patient Stated Goals To increase endurance, lose some weight.   Currently in Pain? No/denies         Treatment: Sit to stand with 3# weights 2 x10  Nu step level 3 x 8 min for endurance Standing Heel raises x 20, Hip abduction x 20  Resisted walking in all directions with single black band x 5 reps each. Cues and sba needed for proper technique and safety.  Forward and lateral step ups on 7" step x 10 bilaterally. Rest breaks taken between sets Total gym squats 2 x 15 reps, close supervision and cues getting on and off machine. Elevation at 19.  Pt has fatigue with standing exercises.  Cues for proper form, sba for safety, seated rest breaks given between exercises as needed for  dyspnea        PT Education - 05/23/15 1609    Education provided Yes   Education Details proper form with exercises. Take rest breaks when needed to regain breath and bring heart rate down.    Person(s) Educated Patient   Methods Explanation;Demonstration;Verbal cues   Comprehension Verbalized understanding;Returned demonstration             PT Long Term Goals - 04/29/15 1235    PT LONG TERM GOAL #2   Title Patient will improve disability demonstrated by improved modified oswestry score of < 10%   Baseline 26%, 02/24/15: 22%, 20% 03/28/15   Time 4   Period Weeks   Status On-going   PT LONG TERM GOAL #5   Title Patient will demonstrate improved balance for increased independence and safety with mobility by demonstrating SLS of 10 sec   Baseline 5 sec   Time 4   Period Weeks   Status On-going               Plan - 05/23/15 1612    Clinical Impression Statement Patient continues to have SOB with activities, however she is making good progress with strength.   Pt will benefit from skilled therapeutic intervention in order to improve on the following deficits Decreased activity tolerance;Decreased balance;Decreased strength   Rehab Potential Good  PT Frequency 2x / week   PT Duration 4 weeks   PT Treatment/Interventions Therapeutic exercise;Therapeutic activities;Balance training;Patient/family education;Aquatic Therapy   PT Next Visit Plan Progress aerobic conditioning and LE strengthening.    Consulted and Agree with Plan of Care Patient        Problem List There are no active problems to display for this patient.   Garnell Begeman, PT, MPT, GCS 05/23/2015, 4:14 PM  [facility] [name] [place], [place], [address] Phone: [phone]   Fax:  [phone]  Name: Kara Pennington MRN: [id number] Date of Birth: 1941/02/09

## 2015-05-27 ENCOUNTER — Ambulatory Visit: Payer: Medicare Other | Admitting: Physical Therapy

## 2015-05-30 ENCOUNTER — Ambulatory Visit: Payer: Medicare Other | Admitting: Physical Therapy

## 2015-06-06 ENCOUNTER — Ambulatory Visit: Payer: Medicare Other | Admitting: Physical Therapy

## 2016-10-05 ENCOUNTER — Encounter: Payer: Self-pay | Admitting: Emergency Medicine

## 2016-10-05 ENCOUNTER — Emergency Department: Payer: Medicare Other

## 2016-10-05 ENCOUNTER — Observation Stay
Admission: EM | Admit: 2016-10-05 | Discharge: 2016-10-07 | Disposition: A | Discharge status: Home / Self Care | Payer: Medicare Other | Attending: Internal Medicine | Admitting: Internal Medicine

## 2016-10-05 DIAGNOSIS — Z79899 Other long term (current) drug therapy: Secondary | ICD-10-CM | POA: Diagnosis not present

## 2016-10-05 DIAGNOSIS — I10 Essential (primary) hypertension: Secondary | ICD-10-CM | POA: Insufficient documentation

## 2016-10-05 DIAGNOSIS — J209 Acute bronchitis, unspecified: Secondary | ICD-10-CM | POA: Diagnosis not present

## 2016-10-05 DIAGNOSIS — F329 Major depressive disorder, single episode, unspecified: Secondary | ICD-10-CM | POA: Insufficient documentation

## 2016-10-05 DIAGNOSIS — J189 Pneumonia, unspecified organism: Secondary | ICD-10-CM | POA: Diagnosis not present

## 2016-10-05 DIAGNOSIS — R197 Diarrhea, unspecified: Secondary | ICD-10-CM | POA: Insufficient documentation

## 2016-10-05 DIAGNOSIS — J40 Bronchitis, not specified as acute or chronic: Secondary | ICD-10-CM

## 2016-10-05 DIAGNOSIS — R2681 Unsteadiness on feet: Secondary | ICD-10-CM | POA: Insufficient documentation

## 2016-10-05 DIAGNOSIS — R0682 Tachypnea, not elsewhere classified: Secondary | ICD-10-CM

## 2016-10-05 DIAGNOSIS — R0602 Shortness of breath: Secondary | ICD-10-CM | POA: Diagnosis present

## 2016-10-05 DIAGNOSIS — K219 Gastro-esophageal reflux disease without esophagitis: Secondary | ICD-10-CM | POA: Insufficient documentation

## 2016-10-05 HISTORY — DX: Gastro-esophageal reflux disease without esophagitis: K21.9

## 2016-10-05 LAB — BASIC METABOLIC PANEL
Anion gap: 9 (ref 5–15)
BUN: 12 mg/dL (ref 6–20)
CALCIUM: 8.9 mg/dL (ref 8.9–10.3)
CO2: 21 mmol/L — AB (ref 22–32)
CREATININE: 1.16 mg/dL — AB (ref 0.44–1.00)
Chloride: 103 mmol/L (ref 101–111)
GFR calc non Af Amer: 44 mL/min — ABNORMAL LOW (ref >60)
GFR, EST AFRICAN AMERICAN: 51 mL/min — AB (ref >60)
GLUCOSE: 128 mg/dL — AB (ref 65–99)
Potassium: 3.7 mmol/L (ref 3.5–5.1)
Sodium: 133 mmol/L — ABNORMAL LOW (ref 135–145)

## 2016-10-05 LAB — HEPATIC FUNCTION PANEL
ALT: 42 U/L (ref 14–54)
AST: 64 U/L — ABNORMAL HIGH (ref 15–41)
Albumin: 3.6 g/dL (ref 3.5–5.0)
Alkaline Phosphatase: 54 U/L (ref 38–126)
BILIRUBIN DIRECT: 0.1 mg/dL (ref 0.1–0.5)
BILIRUBIN INDIRECT: 0.5 mg/dL (ref 0.3–0.9)
Total Bilirubin: 0.6 mg/dL (ref 0.3–1.2)
Total Protein: 6.9 g/dL (ref 6.5–8.1)

## 2016-10-05 LAB — CBC
HCT: 39.3 % (ref 35.0–47.0)
Hemoglobin: 13.5 g/dL (ref 12.0–16.0)
MCH: 31.1 pg (ref 26.0–34.0)
MCHC: 34.2 g/dL (ref 32.0–36.0)
MCV: 91 fL (ref 80.0–100.0)
PLATELETS: 142 10*3/uL — AB (ref 150–440)
RBC: 4.32 MIL/uL (ref 3.80–5.20)
RDW: 13.7 % (ref 11.5–14.5)
WBC: 5.6 10*3/uL (ref 3.6–11.0)

## 2016-10-05 LAB — LIPASE, BLOOD: LIPASE: 24 U/L (ref 11–51)

## 2016-10-05 LAB — TROPONIN I: TROPONIN I: 0.03 ng/mL — AB (ref <0.03)

## 2016-10-05 MED ORDER — SODIUM CHLORIDE 0.9 % IV BOLUS (SEPSIS)
500.0000 mL | Freq: Once | INTRAVENOUS | Status: AC
  Administered 2016-10-05: 500 mL via INTRAVENOUS

## 2016-10-05 MED ORDER — IOPAMIDOL (ISOVUE-370) INJECTION 76%
75.0000 mL | Freq: Once | INTRAVENOUS | Status: AC | PRN
  Administered 2016-10-05: 75 mL via INTRAVENOUS

## 2016-10-05 MED ORDER — IPRATROPIUM-ALBUTEROL 0.5-2.5 (3) MG/3ML IN SOLN
3.0000 mL | Freq: Once | RESPIRATORY_TRACT | Status: AC
  Administered 2016-10-05: 3 mL via RESPIRATORY_TRACT
  Filled 2016-10-05: qty 3

## 2016-10-05 NOTE — ED Triage Notes (Signed)
Patient brought in by ems from home. Patient was diagnosed with bronchitis on Tuesday and symptoms have become worse.

## 2016-10-05 NOTE — ED Notes (Signed)
Pt currently on antibiotics through [facility] clinic for Bronchitis and cough.

## 2016-10-05 NOTE — ED Provider Notes (Signed)
[facility] Emergency Department Provider Note   ____________________________________________   First MD Initiated Contact with Patient 10/05/16 2106     (approximate)  I have reviewed the triage vital signs and the nursing notes.   HISTORY  Chief Complaint Shortness of Breath    HPI Kara Pennington is a 76 y.o. female who presents here with her daughter. Both are very pleasant  The patient and her daughter report that on around Monday she began experiencing a cough, nasal congestion and a slight scratching of the throat. She then developed a fairly significant primarily nonproductive cough, with some associated feeling of shortness of breath. She noted that she felt feverish, and saw urgent care where she was prescribed Omnicef and azithromycin which she has been taking since Tuesday  On Wednesday she felt more short of breath, EMS evaluated her and decision was made to stay at home and she felt improved by the time they arrived. She is using an inhaler occasionally which provide some relief but she denies a history of any COPD or emphysema  Today she began experiencing worsening cough, feeling more short of breath, especially difficult and short of breath with walking. She was nauseated and did vomit a very small amount once this evening. She's had several loose stools after starting antibiotic, but these have improved now.  She feels very tired, dehydrated, fevers, and continues to have shortness of breath much worse with walking with a nonproductive cough.  No chest pain. No leg swelling. No history of any blood clots. No abdominal pain but did have nausea and vomited once earlier. Decreased appetite     Past Medical History:  Diagnosis Date  . Anxiety   . Depression   . GERD (gastroesophageal reflux disease)   . Hypertension     There are no active problems to display for this patient.   Past Surgical History:  Procedure Laterality Date  .  APPENDECTOMY    . PARATHYROIDECTOMY      Prior to Admission medications   Medication Sig Start Date End Date Taking? Authorizing Provider  albuterol-ipratropium (COMBIVENT) 18-103 MCG/ACT inhaler Inhale into the lungs every 4 (four) hours.   Yes Historical Provider, MD  azithromycin (ZITHROMAX) 250 MG tablet Take 1 tablet by mouth daily. 10/02/16  Yes Historical Provider, MD  cefdinir (OMNICEF) 300 MG capsule Take 300 mg by mouth 2 (two) times daily. 10/02/16 10/12/16 Yes Historical Provider, MD  FLUoxetine (PROZAC) 20 MG tablet Take 20 mg by mouth daily.   Yes Historical Provider, MD  losartan (COZAAR) 50 MG tablet Take 50 mg by mouth daily.   Yes Historical Provider, MD  pantoprazole (PROTONIX) 40 MG tablet Take 40 mg by mouth daily.   Yes Historical Provider, MD    Allergies Patient has no known allergies.  No family history on file.  Social History Social History  Substance Use Topics  . Smoking status: Never Smoker  . Smokeless tobacco: Never Used  . Alcohol use Yes     Comment: rarely    Review of Systems Constitutional: Fatigue, fevers  Eyes: No visual changes. ENT: No sore throat now. Cardiovascular: Denies chest pain. Respiratory: See history of present illness Gastrointestinal: No abdominal pain.    No constipation. Genitourinary: Negative for dysuria. Musculoskeletal: Negative for back pain. Skin: Negative for rash. Neurological: Negative for headaches, focal weakness or numbness.  10-point ROS otherwise negative.  ____________________________________________   PHYSICAL EXAM:  VITAL SIGNS: ED Triage Vitals  Enc Vitals Group  BP 10/05/16 2048 (!) 179/98     Pulse Rate 10/05/16 2048 85     Resp 10/05/16 2048 18     Temp 10/05/16 2048 99.5 F (37.5 C)     Temp Source 10/05/16 2048 Oral     SpO2 10/05/16 2048 97 %     Weight 10/05/16 2043 184 lb (83.5 kg)     Height 10/05/16 2043  (1.549 m)     Head Circumference --      Peak Flow --      Pain  Score --      Pain Loc --      Pain Edu? --      Excl. in GC? --     Constitutional: Alert and oriented. Well appearing and in no acute distress.Pleasant. Eyes: Conjunctivae are normal. PERRL. EOMI. Head: Atraumatic. Nose: No congestion/rhinnorhea. Mouth/Throat: Mucous membranes are moist.  Oropharynx non-erythematous. Neck: No stridor.   Cardiovascular: Normal rate, regular rhythm. Grossly normal heart sounds.  Good peripheral circulation. Respiratory: Normal respiratory effort.  No retractions. Lungs clear in lung fields, without crackles but does have central rhonchorous sounds especially with coughing. Frequent dry cough. Gastrointestinal: Soft and nontender. No distention.  Musculoskeletal: No lower extremity tenderness nor edema.  No joint effusions. Neurologic:  Normal speech and language. No gross focal neurologic deficits are appreciated. Skin:  Skin is warm, dry and intact. No rash noted. Psychiatric: Mood and affect are normal. Speech and behavior are normal.  ____________________________________________   LABS (all labs ordered are listed, but only abnormal results are displayed)  Labs Reviewed  BASIC METABOLIC PANEL - Abnormal; Notable for the following:       Result Value   Sodium 133 (*)    CO2 21 (*)    Glucose, Bld 128 (*)    Creatinine, Ser 1.16 (*)    GFR calc non Af Amer 44 (*)    GFR calc Af Amer 51 (*)    All other components within normal limits  CBC - Abnormal; Notable for the following:    Platelets 142 (*)    All other components within normal limits  TROPONIN I - Abnormal; Notable for the following:    Troponin I 0.03 (*)    All other components within normal limits  HEPATIC FUNCTION PANEL - Abnormal; Notable for the following:    AST 64 (*)    All other components within normal limits  LIPASE, BLOOD  TROPONIN I   ____________________________________________  EKG  ED ECG REPORT I, Mayur Duman, the attending physician, personally viewed and  interpreted this ECG.  Date: 10/05/2016 EKG Time: 2050 Rate: 80 Rhythm: normal sinus rhythm QRS Axis: normal Intervals: normal ST/T Wave abnormalities: normal Conduction Disturbances: none Narrative Interpretation: unremarkable  ____________________________________________  RADIOLOGY  Dg Chest 2 View  Result Date: 10/05/2016 CLINICAL DATA:  Progressive cough and shortness of breath. EXAM: CHEST  2 VIEW COMPARISON:  Report from chest radiograph 10/02/2016, images not available. Chest CT 11/27/2014 FINDINGS: Heart is normal in size. 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 No pulmonary edema. Mild bronchial thickening. Minimal streaky right infrahilar atelectasis. No pleural fluid or pneumothorax. No acute osseous abnormalities. IMPRESSION: Bronchial thickening.  Minimal streaky right infrahilar atelectasis. Electronically Signed   By: Rubye Oaks M.D.   On: 10/05/2016 21:38    ____________________________________________   PROCEDURES  Procedure(s) performed: None  Procedures  Critical Care performed: No  ____________________________________________   INITIAL IMPRESSION / ASSESSMENT AND PLAN / ED COURSE  Pertinent  labs & imaging results that were available during my care of the patient were reviewed by me and considered in my medical decision making (see chart for details).  Patient presents for increasing shortness of breath the setting of a recent diagnosis of bronchitis. She is not improving. She reports persistent ongoing cough with worsening shortness of breath especially with exertion. There is no history of congestive heart failure, has no JVD or peripheral edema. Does not appear to have any obvious risk factors for pulmonary embolism or pleuritic chest pain. Denies any chest pain. Appears most consistent with an upper respiratory infection with bronchitis, but wished to certainly exclude pneumonia or other acute causes at this  time.  ----------------------------------------- 10:34 PM on 10/05/2016 -----------------------------------------  Patient reports she feels well. Feeling better. Feels she may have some very slight dehydration, and most likely bronchitis. 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 Based upon this and her dyspnea, though I most suspect this is bronchitis, we will proceed with CT angiography to exclude pulmonary embolism as she's had 2 previous DVTs and reports worsening shortness of breath despite at home treatments for bronchitis. Patient and family agreeable.  ____________________________________________  ----------------------------------------- 11:14 PM on 10/05/2016 -----------------------------------------  Patient to is resting comfortably. Stable vital signs. Ongoing care assigned to Dr. Dolores Frame. Patient awaiting CT angiography to further evaluate for dyspnea and rule out pulmonary embolus. In addition a second troponin is ordered. If these are reassuring, I would perform ambulatory trending pulse ox, reevaluate and consider discharge if the patient remains stable without hypoxia or worsening concern. She does appear appropriate to continue her current antibiotics and inhalers for what appears to be bronchitis but wished to exclude other causes in particular pulmonary embolism.  FINAL CLINICAL IMPRESSION(S) / ED DIAGNOSES  Final diagnoses:  Shortness of breath      NEW MEDICATIONS STARTED DURING THIS VISIT:  New Prescriptions   No medications on file     Note:  This document was prepared using Dragon voice recognition software and may include unintentional dictation errors.     Sharyn Creamer, MD 10/05/16 [phone]

## 2016-10-06 DIAGNOSIS — J209 Acute bronchitis, unspecified: Secondary | ICD-10-CM | POA: Diagnosis not present

## 2016-10-06 LAB — GASTROINTESTINAL PANEL BY PCR, STOOL (REPLACES STOOL CULTURE)
Adenovirus F40/41: NOT DETECTED
Astrovirus: NOT DETECTED
Campylobacter species: NOT DETECTED
Cryptosporidium: NOT DETECTED
Cyclospora cayetanensis: NOT DETECTED
ENTEROAGGREGATIVE E COLI (EAEC): NOT DETECTED
Entamoeba histolytica: NOT DETECTED
Enteropathogenic E coli (EPEC): NOT DETECTED
Enterotoxigenic E coli (ETEC): NOT DETECTED
GIARDIA LAMBLIA: NOT DETECTED
NOROVIRUS GI/GII: NOT DETECTED
Plesimonas shigelloides: NOT DETECTED
ROTAVIRUS A: NOT DETECTED
SALMONELLA SPECIES: NOT DETECTED
SHIGA LIKE TOXIN PRODUCING E COLI (STEC): NOT DETECTED
SHIGELLA/ENTEROINVASIVE E COLI (EIEC): NOT DETECTED
Sapovirus (I, II, IV, and V): NOT DETECTED
Vibrio cholerae: NOT DETECTED
Vibrio species: NOT DETECTED
Yersinia enterocolitica: NOT DETECTED

## 2016-10-06 LAB — CBC
HEMATOCRIT: 37.2 % (ref 35.0–47.0)
Hemoglobin: 12.7 g/dL (ref 12.0–16.0)
MCH: 31.9 pg (ref 26.0–34.0)
MCHC: 34 g/dL (ref 32.0–36.0)
MCV: 93.9 fL (ref 80.0–100.0)
PLATELETS: 140 10*3/uL — AB (ref 150–440)
RBC: 3.96 MIL/uL (ref 3.80–5.20)
RDW: 13.9 % (ref 11.5–14.5)
WBC: 4.8 10*3/uL (ref 3.6–11.0)

## 2016-10-06 LAB — EXPECTORATED SPUTUM ASSESSMENT W GRAM STAIN, RFLX TO RESP C

## 2016-10-06 LAB — TROPONIN I
TROPONIN I: 0.03 ng/mL — AB (ref <0.03)
Troponin I: 0.04 ng/mL (ref <0.03)
Troponin I: <0.03 ng/mL (ref <0.03)

## 2016-10-06 LAB — URINALYSIS, ROUTINE W REFLEX MICROSCOPIC
BACTERIA UA: NONE SEEN
BILIRUBIN URINE: NEGATIVE
Glucose, UA: NEGATIVE mg/dL
Ketones, ur: NEGATIVE mg/dL
LEUKOCYTES UA: NEGATIVE
NITRITE: NEGATIVE
PROTEIN: NEGATIVE mg/dL
Specific Gravity, Urine: 1.024 (ref 1.005–1.030)
pH: 5 (ref 5.0–8.0)

## 2016-10-06 LAB — C DIFFICILE QUICK SCREEN W PCR REFLEX
C DIFFICLE (CDIFF) ANTIGEN: NEGATIVE
C Diff interpretation: NOT DETECTED
C Diff toxin: NEGATIVE

## 2016-10-06 LAB — EXPECTORATED SPUTUM ASSESSMENT W REFEX TO RESP CULTURE: SPECIAL REQUESTS: NORMAL

## 2016-10-06 LAB — BASIC METABOLIC PANEL
Anion gap: 9 (ref 5–15)
BUN: 12 mg/dL (ref 6–20)
CO2: 19 mmol/L — ABNORMAL LOW (ref 22–32)
Calcium: 8.4 mg/dL — ABNORMAL LOW (ref 8.9–10.3)
Chloride: 104 mmol/L (ref 101–111)
Creatinine, Ser: 1.16 mg/dL — ABNORMAL HIGH (ref 0.44–1.00)
GFR, EST AFRICAN AMERICAN: 51 mL/min — AB (ref >60)
GFR, EST NON AFRICAN AMERICAN: 44 mL/min — AB (ref >60)
Glucose, Bld: 164 mg/dL — ABNORMAL HIGH (ref 65–99)
POTASSIUM: 3.4 mmol/L — AB (ref 3.5–5.1)
SODIUM: 132 mmol/L — AB (ref 135–145)

## 2016-10-06 LAB — LIPID PANEL
Cholesterol: 119 mg/dL (ref 0–200)
HDL: 45 mg/dL (ref >40)
LDL CALC: 64 mg/dL (ref 0–99)
Total CHOL/HDL Ratio: 2.6 RATIO
Triglycerides: 49 mg/dL (ref <150)
VLDL: 10 mg/dL (ref 0–40)

## 2016-10-06 LAB — TSH: TSH: 2.831 u[IU]/mL (ref 0.350–4.500)

## 2016-10-06 MED ORDER — MENTHOL 3 MG MT LOZG
1.0000 | LOZENGE | OROMUCOSAL | Status: DC | PRN
  Administered 2016-10-06: 3 mg via ORAL
  Filled 2016-10-06: qty 9

## 2016-10-06 MED ORDER — HYDROCOD POLST-CPM POLST ER 10-8 MG/5ML PO SUER
ORAL | Status: AC
  Administered 2016-10-06: 5 mL via ORAL
  Filled 2016-10-06: qty 5

## 2016-10-06 MED ORDER — PANTOPRAZOLE SODIUM 40 MG PO TBEC
40.0000 mg | DELAYED_RELEASE_TABLET | Freq: Every day | ORAL | Status: DC
  Administered 2016-10-06 – 2016-10-07 (×2): 40 mg via ORAL
  Filled 2016-10-06 (×2): qty 1

## 2016-10-06 MED ORDER — OXYCODONE HCL 5 MG PO TABS: 5.0000 mg | ORAL_TABLET | ORAL | Status: DC | PRN

## 2016-10-06 MED ORDER — DEXTROMETHORPHAN POLISTIREX ER 30 MG/5ML PO SUER
30.0000 mg | Freq: Two times a day (BID) | ORAL | Status: DC
  Administered 2016-10-06 – 2016-10-07 (×3): 30 mg via ORAL
  Filled 2016-10-06 (×8): qty 5

## 2016-10-06 MED ORDER — HYDROCOD POLST-CPM POLST ER 10-8 MG/5ML PO SUER
5.0000 mL | Freq: Once | ORAL | Status: AC
Start: 2016-10-06 — End: 2016-10-06
  Administered 2016-10-06: 5 mL via ORAL

## 2016-10-06 MED ORDER — IPRATROPIUM BROMIDE 0.02 % IN SOLN: 0.5000 mg | Freq: Four times a day (QID) | RESPIRATORY_TRACT | Status: DC | PRN

## 2016-10-06 MED ORDER — ACETAMINOPHEN 650 MG RE SUPP: 650.0000 mg | Freq: Four times a day (QID) | RECTAL | Status: DC | PRN

## 2016-10-06 MED ORDER — MAGNESIUM CITRATE PO SOLN
1.0000 | Freq: Once | ORAL | Status: DC | PRN
  Filled 2016-10-06: qty 296

## 2016-10-06 MED ORDER — FLUOXETINE HCL 20 MG PO TABS
20.0000 mg | ORAL_TABLET | Freq: Every day | ORAL | Status: DC
  Administered 2016-10-06 – 2016-10-07 (×2): 20 mg via ORAL
  Filled 2016-10-06 (×4): qty 1

## 2016-10-06 MED ORDER — BISACODYL 5 MG PO TBEC: 5.0000 mg | DELAYED_RELEASE_TABLET | Freq: Every day | ORAL | Status: DC | PRN

## 2016-10-06 MED ORDER — ONDANSETRON HCL 4 MG PO TABS: 4.0000 mg | ORAL_TABLET | Freq: Four times a day (QID) | ORAL | Status: DC | PRN

## 2016-10-06 MED ORDER — IPRATROPIUM-ALBUTEROL 0.5-2.5 (3) MG/3ML IN SOLN
3.0000 mL | Freq: Once | RESPIRATORY_TRACT | Status: AC
  Administered 2016-10-06: 3 mL via RESPIRATORY_TRACT
  Filled 2016-10-06: qty 3

## 2016-10-06 MED ORDER — METHYLPREDNISOLONE SODIUM SUCC 125 MG IJ SOLR
60.0000 mg | Freq: Four times a day (QID) | INTRAMUSCULAR | Status: DC
  Administered 2016-10-06 – 2016-10-07 (×6): 60 mg via INTRAVENOUS
  Filled 2016-10-06 (×6): qty 2

## 2016-10-06 MED ORDER — ONDANSETRON HCL 4 MG/2ML IJ SOLN: 4.0000 mg | Freq: Four times a day (QID) | INTRAMUSCULAR | Status: DC | PRN

## 2016-10-06 MED ORDER — DM-GUAIFENESIN ER 30-600 MG PO TB12: 1.0000 | ORAL_TABLET | Freq: Two times a day (BID) | ORAL | Status: DC

## 2016-10-06 MED ORDER — SENNOSIDES-DOCUSATE SODIUM 8.6-50 MG PO TABS: 1.0000 | ORAL_TABLET | Freq: Every evening | ORAL | Status: DC | PRN

## 2016-10-06 MED ORDER — GUAIFENESIN ER 600 MG PO TB12
600.0000 mg | ORAL_TABLET | Freq: Two times a day (BID) | ORAL | Status: DC
  Administered 2016-10-06 – 2016-10-07 (×4): 600 mg via ORAL
  Filled 2016-10-06 (×6): qty 1

## 2016-10-06 MED ORDER — SODIUM CHLORIDE 0.9 % IV SOLN
INTRAVENOUS | Status: DC
  Administered 2016-10-06: 03:00:00 via INTRAVENOUS

## 2016-10-06 MED ORDER — ALBUTEROL SULFATE (2.5 MG/3ML) 0.083% IN NEBU: 2.5000 mg | INHALATION_SOLUTION | Freq: Four times a day (QID) | RESPIRATORY_TRACT | Status: DC | PRN

## 2016-10-06 MED ORDER — SODIUM CHLORIDE 0.9% FLUSH
3.0000 mL | Freq: Two times a day (BID) | INTRAVENOUS | Status: DC
  Administered 2016-10-06 – 2016-10-07 (×3): 3 mL via INTRAVENOUS

## 2016-10-06 MED ORDER — CEFTRIAXONE SODIUM-DEXTROSE 1-3.74 GM-% IV SOLR
1.0000 g | INTRAVENOUS | Status: DC
  Administered 2016-10-06 – 2016-10-07 (×2): 1 g via INTRAVENOUS
  Filled 2016-10-06 (×2): qty 50

## 2016-10-06 MED ORDER — ACETAMINOPHEN 325 MG PO TABS: 650.0000 mg | ORAL_TABLET | Freq: Four times a day (QID) | ORAL | Status: DC | PRN

## 2016-10-06 MED ORDER — AZITHROMYCIN 500 MG IV SOLR
500.0000 mg | INTRAVENOUS | Status: DC
  Administered 2016-10-06 – 2016-10-07 (×2): 500 mg via INTRAVENOUS
  Filled 2016-10-06 (×3): qty 500

## 2016-10-06 MED ORDER — LOSARTAN POTASSIUM 50 MG PO TABS
50.0000 mg | ORAL_TABLET | Freq: Every day | ORAL | Status: DC
  Administered 2016-10-06 – 2016-10-07 (×2): 50 mg via ORAL
  Filled 2016-10-06 (×2): qty 1

## 2016-10-06 NOTE — Plan of Care (Signed)
Problem: Acute Rehab PT Goals(only PT should resolve) Goal: Pt will Roll Supine to Side Outcome: Progressing Patient will demonstrate independence to supine to rolling to demonstrate ability to shift weight in bed to decrease chance of getting bed sores.  Goal: Pt Will Go Supine/Side To Sit Outcome: Progressing Patient will demonstrate independence with supine to sit to demonstrate improvement in performance of ADLs and preparing to walk in the morning. Goal: Pt Will Ambulate Outcome: Progressing Patient will be able to amb independently with the use of a SPC to be able to enter home from the chair without assistance.

## 2016-10-06 NOTE — Evaluation (Signed)
Physical Therapy Evaluation Patient Details Name: Kara Pennington MRN: [id number] DOB: 12/24/1940 Today's Date: 10/06/2016   History of Present Illness  Pt is a 76 yo right hand dominant female presenting with shortness of breath and DOE. Patient reports she has a productive cough and a history of anxiety, depression, GERD, hypertension. Patient states she reported to ED after noticing increased symptoms at the beginning of the week whichbegan to worsen  Clinical Impression  Pt is a 76 yo female presenting with shortness of breath and DOE. Patient demonstrates independence with bed mobility, and requires supervision assistance with amb and sit to stand transfers. Patient demonstrates no decrease in SP02 when ambulating with range of SpO2 being 92-96% throughout entirety of session without supplemental O2. Patient reports difficulty with performing home ADL's such as transferring out of the tub. Patient will benefit from further skilled therapy to improve balance and walking tolerance. Recommendation Home health PT at D/C.     Follow Up Recommendations Home health PT    Equipment Recommendations  Cane    Recommendations for Other Services       Precautions / Restrictions Precautions Precautions: Fall Restrictions Weight Bearing Restrictions: No      Mobility  Bed Mobility Overal bed mobility: Independent             General bed mobility comments: Patient able to perform bed mobility independently without use of bed rails  Transfers Overall transfer level: Needs assistance Equipment used: None Transfers: Sit to/from Stand Sit to Stand: Supervision         General transfer comment: Patient demonstrates increased instability when performing standing due to decreased balance.   Ambulation/Gait Ambulation/Gait assistance: Supervision Ambulation Distance (Feet): 160 Feet Assistive device: None Gait Pattern/deviations: Narrow base of support;Decreased step length -  right;Decreased step length - left   Gait velocity interpretation: Below normal speed for age/gender General Gait Details: Patient demonstrates slow gait speed and intermittently touches the side of walls for balance.   Stairs            Wheelchair Mobility    Modified Rankin (Stroke Patients Only)       Balance Overall balance assessment: History of Falls;Needs assistance Sitting-balance support: No upper extremity supported Sitting balance-Leahy Scale: Good Sitting balance - Comments: Patient able to adjust weight in sitting without use of UEs   Standing balance support: No upper extremity supported Standing balance-Leahy Scale: Fair Standing balance comment: patient able to stand without support, but unable to perform a challened Single Leg Stance - Right Leg: 1 Single Leg Stance - Left Leg: 1 Tandem Stance - Right Leg: 3 Tandem Stance - Left Leg: 3 Rhomberg - Eyes Opened: 30 Rhomberg - Eyes Closed: 30                 Pertinent Vitals/Pain Pain Assessment: No/denies pain    Home Living Family/patient expects to be discharged to:: Private residence Living Arrangements: Alone   Type of Home: Apartment Home Access: Level entry     Home Layout: One level Home Equipment: Cane - single point Additional Comments: Patient reports she has difficulty with getting out of the tub. and has a hand which is fixated between the tub for stabilization    Prior Function Level of Independence: Independent with assistive device(s)         Comments: Patient amb with use of cane for community distances.      Hand Dominance   Dominant Hand: Right    Extremity/Trunk Assessment  Upper Extremity Assessment Upper Extremity Assessment: Overall WFL for tasks assessed    Lower Extremity Assessment Lower Extremity Assessment: Overall WFL for tasks assessed       Communication   Communication: No difficulties  Cognition Arousal/Alertness: Awake/alert Behavior  During Therapy: WFL for tasks assessed/performed Overall Cognitive Status: Within Functional Limits for tasks assessed                                        General Comments      Exercises Other Exercises Other Exercises: Patient performed sit to stand x 3; tandem stance B for 20 sec each side, and single leg stance attempted B for 10 sec a piece. Patient able to amb without AD [id number] with supervision assist requiring cueing on step length and gait speed.    Assessment/Plan    PT Assessment Patient needs continued PT services  PT Problem List Decreased strength;Decreased range of motion;Decreased activity tolerance;Decreased balance;Decreased mobility;Decreased coordination       PT Treatment Interventions Gait training;Therapeutic activities;Therapeutic exercise;Neuromuscular re-education;Patient/family education;Balance training    PT Goals (Current goals can be found in the Care Plan section)  Acute Rehab PT Goals Patient Stated Goal: Patient would like to go home  PT Goal Formulation: With patient Time For Goal Achievement: 10/20/16 Potential to Achieve Goals: Good    Frequency Min 2X/week   Barriers to discharge   Decreased Strength and balance, recommend home health PT    Co-evaluation               End of Session Equipment Utilized During Treatment: Gait belt Activity Tolerance: Patient tolerated treatment well Patient left: in chair;with call bell/phone within reach;with chair alarm set Nurse Communication: Mobility status PT Visit Diagnosis: Unsteadiness on feet (R26.81);Other abnormalities of gait and mobility (R26.89);Difficulty in walking, not elsewhere classified (R26.2)    Time: 6962-9528 PT Time Calculation (min) (ACUTE ONLY): 25 min   Charges:   PT Evaluation $PT Eval Low Complexity: 1 Procedure PT Treatments $Gait Training: 8-22 mins $Therapeutic Exercise: 8-22 mins   PT G Codes:   PT G-Codes **NOT FOR INPATIENT  CLASS** Functional Assessment Tool Used: AM-PAC 6 Clicks Basic Mobility   Kara Pennington, PT DPT 10/06/16, 12:24 PM [phone]  [place] Kara Pennington 10/06/2016, 12:21 PM

## 2016-10-06 NOTE — ED Notes (Signed)
Ambulated patient on pulse oximetry per MD request. Pt maintained oxygen saturation at 94-96%. Pt's heart rate stable at 90-96 bpm. Pt's respirations increased from 15-20 to 35-45 during ambulation. Pt's work of breathing significantly increased. Pt became unsteady during ambulation. Pt placed back in bed and MD [facility] informed.

## 2016-10-06 NOTE — ED Provider Notes (Signed)
-----------------------------------------   12:27 AM on 10/06/2016 -----------------------------------------  Assumed care of patient who was pending CT chest and repeat troponin. Ambulated patient with pulse ox; patient maintained oxygen saturation between 94-96%, however increased her respirations to 45. Currently tachypneic with diminished lung bases. Will administer nebulizer treatment and discuss with hospitalist to evaluate patient in the emergency department for admission.  CT chest interpreted per Dr. Sterling Big: 1. 3 mm right upper lobe subpleural pulmonary nodule. No follow-up  needed if patient is low-risk. Non-contrast chest CT can be  considered in 12 months if patient is high-risk. This recommendation  follows the consensus statement: Guidelines for Management of  Incidental Pulmonary Nodules Detected on CT Images: From the  Fleischner Society 2017; Radiology 2017; 284:228-243.  2. Dependent atelectasis and mild bronchitic change of the lungs. No  alveolar consolidation or pneumothorax.  3. Aortic atherosclerosis.  4. Small hiatal hernia.      Irean Hong, MD 10/06/16 [phone]

## 2016-10-06 NOTE — Progress Notes (Signed)
Patient briefly seen this morning. Continue plan as outlined by Dr. Emmit Pomfret

## 2016-10-06 NOTE — Care Management Obs Status (Signed)
MEDICARE OBSERVATION STATUS NOTIFICATION   Patient Details  Name: Kara Pennington MRN: [id number] Date of Birth: October 31, 1940   Medicare Observation Status Notification Given:  Yes (MOON given)    Hannahmarie Asberry A, RN 10/06/2016, 2:53 PM

## 2016-10-06 NOTE — Progress Notes (Signed)
Pharmacy Antibiotic Note  Kara Pennington is a 76 y.o. female admitted on 10/05/2016 with pneumonia.  Pharmacy has been consulted for Ceftriaxone dosing.  Plan: Ceftriaxone 1g IV daily  Height:  (154.9 cm) Weight: 178 lb 6.4 oz (80.9 kg) IBW/kg (Calculated) : 47.8  Temp (24hrs), Avg:99.3 F (37.4 C), Min:99 F (37.2 C), Max:99.5 F (37.5 C)   Recent Labs Lab 10/05/16 2052 10/06/16 0248  WBC 5.6 4.8  CREATININE 1.16* 1.16*    Estimated Creatinine Clearance: 39.1 mL/min (A) (by C-G formula based on SCr of 1.16 mg/dL (H)).    No Known Allergies  Thank you for allowing pharmacy to be a part of this patient's care.  Thomasene Ripple, PharmD, BCPS Clinical Pharmacist 10/06/2016

## 2016-10-06 NOTE — ED Notes (Signed)
MD Sung at bedside. 

## 2016-10-06 NOTE — H&P (Signed)
History and Physical   [facility] - [place] @ [facility] Admission History and Physical [employer], D.O.    Patient Name: Kara Pennington MR#: [id number] Date of Birth: 23-Jun-1941 Date of Admission: 10/05/2016  Referring MD/NP/PA: Dr. Dolores Frame Primary Care Physician: Patrice Paradise, MD Patient coming from: Home Outpatient Specialists: Dr. Lady Gary, Dr. Meredeth Ide   Chief Complaint:  Chief Complaint  Patient presents with  . Shortness of Breath    HPI: Kara Pennington is a 77 y.o. female with a known history of anxiety, depression, GERD, hypertension presents to the emergency department for evaluation of shortness of breath.  Patient was in a usual state of health until about 5 days ago when she reports productive cough, congestion and shortness of breath. She was seen in urgent care and was prescribed is azithromycin and Omnicef however her symptoms have worsened. Today she had an acute worsening of her symptoms including shortness of breath, dyspnea on exertion and associated with fevers  Vomiting, fatigue, decreased appetite.  Patient reports several years history of shortness of breath for which she did have a pulmonary workup and although she was given an inhaler for symptomatic relief she has not carried a diagnosis of COPD or asthma. She states that her chronic shortness of breath improves with weight loss.  Also of note patient has had severe watery diarrhea since one week ago. He denies any recent hospitalizations, antibiotic exposure or known sick contacts however she was a large crowd of people one week ago.  Patient denies dizziness, chest pain, abdominal pain, dysuria/frequency, changes in mental status. Otherwise there has been no change in status. Patient has been taking medication as prescribed and there has been no recent change in medication or diet.   EMS/ED Course: Patient received DuoNeb 2, normal saline. Patient's workup in the emergency department has essentially been  negative however upon ambulation patient became tachycardic and tachypnea to the mid 40s with worsening of her symptoms..  Review of Systems:  CONSTITUTIONAL: Positive  fever/chills, fatigue, weakness, negative weight gain/loss, headache. EYES: No blurry or double vision. ENT: No tinnitus, postnasal drip, redness or soreness of the oropharynx. RESPIRATORY: Positive cough, dyspnea, wheeze.  No hemoptysis.  CARDIOVASCULAR: No chest pain, palpitations, syncope, orthopnea. No lower extremity edema.  GASTROINTESTINAL: Positive nausea, vomiting, abdominal pain, diarrhea, negative constipation.  No hematemesis, melena or hematochezia. GENITOURINARY: No dysuria, frequency, hematuria. ENDOCRINE: No polyuria or nocturia. No heat or cold intolerance. HEMATOLOGY: No anemia, bruising, bleeding. INTEGUMENTARY: No rashes, ulcers, lesions. MUSCULOSKELETAL: No arthritis, gout, dyspnea. NEUROLOGIC: No numbness, tingling, ataxia, seizure-type activity, weakness. PSYCHIATRIC: No anxiety, depression, insomnia.   Past Medical History:  Diagnosis Date  . Anxiety   . Depression   . GERD (gastroesophageal reflux disease)   . Hypertension     Past Surgical History:  Procedure Laterality Date  . APPENDECTOMY    . PARATHYROIDECTOMY       reports that she has never smoked. She has never used smokeless tobacco. She reports that she drinks alcohol. She reports that she does not use drugs.  No Known Allergies  Family History   Medical History Relation Name Comments  Other Daughter  Raynaud's and complete regional pain syndrome  Seizures Daughter    Sjogren's syndrome Daughter    Migraines Daughter    Nephrolithiasis Daughter    Coronary Artery Disease (Blocked arteries around heart) Father    Other Mother  Died in a MVA  Depression Son    Lupus Son  Prior to Admission medications   Medication Sig Start Date End Date Taking? Authorizing Provider  albuterol-ipratropium  (COMBIVENT) 18-103 MCG/ACT inhaler Inhale into the lungs every 4 (four) hours.   Yes Historical Provider, MD  azithromycin (ZITHROMAX) 250 MG tablet Take 1 tablet by mouth daily. 10/02/16  Yes Historical Provider, MD  cefdinir (OMNICEF) 300 MG capsule Take 300 mg by mouth 2 (two) times daily. 10/02/16 10/12/16 Yes Historical Provider, MD  FLUoxetine (PROZAC) 20 MG tablet Take 20 mg by mouth daily.   Yes Historical Provider, MD  losartan (COZAAR) 50 MG tablet Take 50 mg by mouth daily.   Yes Historical Provider, MD  pantoprazole (PROTONIX) 40 MG tablet Take 40 mg by mouth daily.   Yes Historical Provider, MD    Physical Exam: Vitals:   10/05/16 2130 10/05/16 2200 10/05/16 2330 10/06/16 0030  BP: (!) 178/94 (!) 169/82 (!) 159/75 (!) 150/86  Pulse: 84 87 85 79  Resp: (!) 24 (!) 22 (!) 23 18  Temp:      TempSrc:      SpO2: 90% 96% 96% 98%  Weight:      Height:        GENERAL: 76 y.o.-year-old White female patient, well-developed, well-nourished lying in the bed in no acute distress. Ill-appearing. Pleasant and cooperative.   HEENT: Head atraumatic, normocephalic. Pupils equal, round, reactive to light and accommodation. No scleral icterus. Extraocular muscles intact. Nares are patent. Oropharynx is clear. Mucus membranes moist. NECK: Supple, full range of motion. No JVD, no bruit heard. No thyroid enlargement, no tenderness, no cervical lymphadenopathy. CHEST: Bibasilar rhonchi No use of accessory muscles of respiration.  No reproducible chest wall tenderness.  CARDIOVASCULAR: S1, S2 normal. No murmurs, rubs, or gallops. Cap refill <2 seconds. Pulses intact distally.  ABDOMEN: Soft, nondistended, nontender. No rebound, guarding, rigidity. Normoactive bowel sounds present in all four quadrants. No organomegaly or mass. EXTREMITIES: No pedal edema, cyanosis, or clubbing. No calf tenderness or Homan's sign.  NEUROLOGIC: The patient is alert and oriented x 3. Cranial nerves II through XII are grossly  intact with no focal sensorimotor deficit. Muscle strength 5/5 in all extremities. Sensation intact. Gait not checked. PSYCHIATRIC:  Normal affect, mood, thought content. SKIN: Warm, dry, and intact without obvious rash, lesion, or ulcer.    Labs on Admission:  CBC:  Recent Labs Lab 10/05/16 2052  WBC 5.6  HGB 13.5  HCT 39.3  MCV 91.0  PLT 142*   Basic Metabolic Panel:  Recent Labs Lab 10/05/16 2052  NA 133*  K 3.7  CL 103  CO2 21*  GLUCOSE 128*  BUN 12  CREATININE 1.16*  CALCIUM 8.9   GFR: Estimated Creatinine Clearance: 39.8 mL/min (A) (by C-G formula based on SCr of 1.16 mg/dL (H)). Liver Function Tests:  Recent Labs Lab 10/05/16 2052  AST 64*  ALT 42  ALKPHOS 54  BILITOT 0.6  PROT 6.9  ALBUMIN 3.6    Recent Labs Lab 10/05/16 2052  LIPASE 24   No results for input(s): AMMONIA in the last 168 hours. Coagulation Profile: No results for input(s): INR, PROTIME in the last 168 hours. Cardiac Enzymes:  Recent Labs Lab 10/05/16 2052 10/05/16 2339  TROPONINI 0.03* 0.03*   BNP (last 3 results) No results for input(s): PROBNP in the last 8760 hours. HbA1C: No results for input(s): HGBA1C in the last 72 hours. CBG: No results for input(s): GLUCAP in the last 168 hours. Lipid Profile: No results for input(s): CHOL, HDL, LDLCALC, TRIG, CHOLHDL,  LDLDIRECT in the last 72 hours. Thyroid Function Tests: No results for input(s): TSH, T4TOTAL, FREET4, T3FREE, THYROIDAB in the last 72 hours. Anemia Panel: No results for input(s): VITAMINB12, FOLATE, FERRITIN, TIBC, IRON, RETICCTPCT in the last 72 hours. Urine analysis:    Component Value Date/Time   COLORURINE Yellow 11/24/2012 2239   APPEARANCEUR Hazy 11/24/2012 2239   LABSPEC 1.018 11/24/2012 2239   PHURINE 5.0 11/24/2012 2239   GLUCOSEU Negative 11/24/2012 2239   HGBUR Negative 11/24/2012 2239   BILIRUBINUR Negative 11/24/2012 2239   KETONESUR Negative 11/24/2012 2239   PROTEINUR Negative  11/24/2012 2239   NITRITE Negative 11/24/2012 2239   LEUKOCYTESUR 1+ 11/24/2012 2239   Sepsis Labs: (procalcitonin:4,lacticidven:4) )No results found for this or any previous visit (from the past 240 hour(s)).   Radiological Exams on Admission: Dg Chest 2 View  Result Date: 10/05/2016 CLINICAL DATA:  Progressive cough and shortness of breath. EXAM: CHEST  2 VIEW COMPARISON:  Report from chest radiograph 10/02/2016, images not available. Chest CT 11/27/2014 FINDINGS: Heart is normal in size. 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 No pulmonary edema. Mild bronchial thickening. Minimal streaky right infrahilar atelectasis. No pleural fluid or pneumothorax. No acute osseous abnormalities. IMPRESSION: Bronchial thickening.  Minimal streaky right infrahilar atelectasis. Electronically Signed   By: Rubye Oaks M.D.   On: 10/05/2016 21:38   Ct Angio Chest Pe W Or Wo Contrast  Result Date: 10/05/2016 CLINICAL DATA:  Worsening bronchitis EXAM: CT ANGIOGRAPHY CHEST WITH CONTRAST TECHNIQUE: Multidetector CT imaging of the chest was performed using the standard protocol during bolus administration of intravenous contrast. Multiplanar CT image reconstructions and MIPs were obtained to evaluate the vascular anatomy. CONTRAST:  75 cc Isovue 370 IV COMPARISON:  None. FINDINGS: Cardiovascular: Normal branch pattern of the great vessels. Aortic atherosclerosis without dissection. No pulmonary embolus. The cardiac chambers are within normal limits for size. Trace anterior pericardial effusion or thickening. Mediastinum/Nodes: Small hiatal hernia. No adenopathy. Patent trachea and mainstem bronchi. No thyromegaly or mass. The esophagus is grossly unremarkable. Lungs/Pleura: 3 mm right upper lobe subpleural nodule, series 6, image 27. Dependent atelectasis bilaterally. No pneumonic consolidation. Tiny blebs and bulla at the lung bases. Mild interstitial prominence with peribronchial thickening  consistent with bronchitic change. Upper Abdomen: Calcification along the anterior aspect of the spleen consistent with a granuloma. No adrenal mass. The visualized kidneys, pancreas and liver are unremarkable. The gallbladder is nondistended and free of stones. No acute bowel inflammation or obstruction. Musculoskeletal: No chest wall abnormality. No acute or significant osseous findings. Review of the MIP images confirms the above findings. IMPRESSION: 1. 3 mm right upper lobe subpleural pulmonary nodule. No follow-up needed if patient is low-risk. Non-contrast chest CT can be considered in 12 months if patient is high-risk. This recommendation follows the consensus statement: Guidelines for Management of Incidental Pulmonary Nodules Detected on CT Images: From the Fleischner Society 2017; Radiology 2017; 284:228-243. 2. Dependent atelectasis and mild bronchitic change of the lungs. No alveolar consolidation or pneumothorax. 3. Aortic atherosclerosis. 4. Small hiatal hernia. Electronically Signed   By: Tollie Eth M.D.   On: 10/05/2016 23:32    EKG: Normal sinus rhythm at 80 bpm with normal axis and nonspecific ST-T wave changes.   Assessment/Plan  This is a 76 y.o. female with a history of anxiety, depression, GERD, hypertension now being admitted with:  #. Bronchitis/CAP, patient failed outpatient treatment.  Will cover for community acquired pneumonia given symptoms and appearance - Admit to observation - IV  Rocephin, Azithromycin per pharmacy, Bacid - IV fluid hydration - Duonebs, expectorants & O2 therapy as needed - Follow up blood & sputum cultures  #. Diarrhea - Check stool studies including Cdiff given recent Abx.  #. Elevated troponin - Monitor on telemetry - Trend troponins, check lipids and TSH.  #. History of GERD - Continue Protonix  #. History of HTN - Continue Cozaar  #. History of anxiety/depression - Continue Prozac  Admission status: Observation, telemetry IV  Fluids: NS Diet/Nutrition: Clear liquids Consults called: None  DVT Px: SCDs and early ambulation. Code Status: Full Code  Disposition Plan: To home in <24 hours days  All the records are reviewed and case discussed with ED provider. Management plans discussed with the patient and/or family who express understanding and agree with plan of care.  Lavance Beazer D.O. on 10/06/2016 at 1:06 AM Between 7am to 6pm - Pager - [phone] After 6pm go to [email or web address] - Social research officer, government [facility] [place] [facility] Office [phone] CC: Primary care physician; Patrice Paradise, MD   10/06/2016, 1:06 AM

## 2016-10-07 DIAGNOSIS — J209 Acute bronchitis, unspecified: Secondary | ICD-10-CM | POA: Diagnosis not present

## 2016-10-07 LAB — BASIC METABOLIC PANEL
Anion gap: 7 (ref 5–15)
BUN: 20 mg/dL (ref 6–20)
CALCIUM: 8.6 mg/dL — AB (ref 8.9–10.3)
CO2: 20 mmol/L — ABNORMAL LOW (ref 22–32)
CREATININE: 0.69 mg/dL (ref 0.44–1.00)
Chloride: 110 mmol/L (ref 101–111)
GFR calc non Af Amer: >60 mL/min (ref >60)
Glucose, Bld: 124 mg/dL — ABNORMAL HIGH (ref 65–99)
Potassium: 3.8 mmol/L (ref 3.5–5.1)
SODIUM: 137 mmol/L (ref 135–145)

## 2016-10-07 MED ORDER — PREDNISONE 20 MG PO TABS: 40.0000 mg | ORAL_TABLET | Freq: Every day | ORAL | 0 refills | Status: AC

## 2016-10-07 MED ORDER — DEXTROMETHORPHAN POLISTIREX ER 30 MG/5ML PO SUER: 30.0000 mg | Freq: Two times a day (BID) | ORAL | 0 refills | Status: DC

## 2016-10-07 MED ORDER — AMOXICILLIN-POT CLAVULANATE 875-125 MG PO TABS: 1.0000 | ORAL_TABLET | Freq: Two times a day (BID) | ORAL | 0 refills | Status: AC

## 2016-10-07 MED ORDER — MENTHOL 3 MG MT LOZG: 1.0000 | LOZENGE | OROMUCOSAL | 12 refills | Status: DC | PRN

## 2016-10-07 NOTE — Care Management Note (Signed)
Case Management Note  Patient Details  Name: Kara Pennington MRN: [id number] Date of Birth: [birth year]  Subjective/Objective:        Discussed discharge planning with [name]. She chose [facility] to be her home care provider. A referral for HH-RN and PT was called to [place] [facility] [place].             Action/Plan:   Expected Discharge Date:  10/07/16               Expected Discharge Plan:     In-House Referral:     Discharge planning Services     Post Acute Care Choice:    Choice offered to:     DME Arranged:    DME Agency:     HH Arranged:    HH Agency:     Status of Service:     If discussed at [employer], dates discussed:    Additional Comments:  Danell Vazquez A, RN 10/07/2016, 10:23 AM

## 2016-10-07 NOTE — Discharge Instructions (Addendum)
We believe that your symptoms are caused today by an exacerbation of  bronchitis.  Please take the prescribed medications from your urgent care visit (antibiotics) and continue use of your home inhaler. Follow up with your doctor as recommended.  If you develop any new or worsening symptoms, including but not limited to fever, persistent vomiting, worsening shortness of breath, chest pain, confusion, or other symptoms that concern you, please return to the Emergency Department immediately.

## 2016-10-07 NOTE — Plan of Care (Signed)
MD making rounds. Received order to discharge home with HHPT. IV removed. Patient provided with Education Handout. Prescriptions given to patient. Discharge paperwork provided, explained, signed and witnessed. No questions left unanswered. Discharged via wheelchair by nursing staff. Belongings sent with patient and family.

## 2016-10-07 NOTE — Discharge Summary (Signed)
[facility] - [place] at [facility]   PATIENT NAME: Kara Pennington    MR#:  [id number]  DATE OF BIRTH:  Dec 01, 1940  DATE OF ADMISSION:  10/05/2016 ADMITTING PHYSICIAN: Tonye Royalty, DO  DATE OF DISCHARGE: 10/07/2016  PRIMARY CARE PHYSICIAN: MCLAUGHLIN, Merleen Milliner, MD    ADMISSION DIAGNOSIS:  Shortness of breath [R06.02] Tachypnea [R06.82] Bronchitis [J40]  DISCHARGE DIAGNOSIS:  Active Problems:   Acute bronchitis CAP  SECONDARY DIAGNOSIS:   Past Medical History:  Diagnosis Date  . Anxiety   . Depression   . GERD (gastroesophageal reflux disease)   . Hypertension     HOSPITAL COURSE:  76 year old female with a history of GERD and anxiety who presents with shortness of breath and found to have CAP.  1. CAP/acute bronchitis: Patient symptoms have drastically improved since admission. She was on Rocephin and azithromycin by mouth she will be discharged on Augmentin. She should have pulmonary follow-up in 2 weeks. She reports that she has had several bouts of bronchitis in the past. She was evaluated approximately 3 years ago by pulmonary. She had wheezing on admission. She will be discharged on 40 mg of prednisone for 3 days. 2. Diarrhea: This is resolved stool cultures and C. difficile were negative.  3. Elevated troponin due to demand ischemia and not ACS  4. Essential hypertension: Continue losartan  5. Depression and anxiety: Continue fluoxetine  6. GERD: Continue PPI   DISCHARGE CONDITIONS AND DIET:  Stable for discharge on heart healthy diet  CONSULTS OBTAINED:    DRUG ALLERGIES:  No Known Allergies  DISCHARGE MEDICATIONS:   Current Discharge Medication List    START taking these medications   Details  amoxicillin-clavulanate (AUGMENTIN) 875-125 MG tablet Take 1 tablet by mouth 2 (two) times daily. Qty: 8 tablet, Refills: 0    dextromethorphan (DELSYM) 30 MG/5ML liquid Take 5 mLs (30 mg total) by mouth 2 (two) times daily. Qty: 89  mL, Refills: 0    menthol-cetylpyridinium (CEPACOL) 3 MG lozenge Take 1 lozenge (3 mg total) by mouth every 2 (two) hours as needed for sore throat. Qty: 100 tablet, Refills: 12    predniSONE (DELTASONE) 20 MG tablet Take 2 tablets (40 mg total) by mouth daily with breakfast. Qty: 6 tablet, Refills: 0      CONTINUE these medications which have NOT CHANGED   Details  albuterol-ipratropium (COMBIVENT) 18-103 MCG/ACT inhaler Inhale into the lungs every 4 (four) hours.    FLUoxetine (PROZAC) 20 MG tablet Take 20 mg by mouth daily.    losartan (COZAAR) 50 MG tablet Take 50 mg by mouth daily.    pantoprazole (PROTONIX) 40 MG tablet Take 40 mg by mouth daily.      STOP taking these medications     azithromycin (ZITHROMAX) 250 MG tablet      cefdinir (OMNICEF) 300 MG capsule           Today   CHIEF COMPLAINT:  The patient reports symptoms have improved. She continues to have a cough. Wheezing has improved   VITAL SIGNS:  Blood pressure (!) 145/61, pulse 69, temperature 97.7 F (36.5 C), temperature source Oral, resp. rate 19, height  (1.549 m), weight 80.9 kg (178 lb 6.4 oz), SpO2 92 %.   REVIEW OF SYSTEMS:  Review of Systems  Constitutional: Negative.  Negative for chills, fever and malaise/fatigue.  HENT: Negative.  Negative for ear discharge, ear pain, hearing loss, nosebleeds and sore throat.   Eyes: Negative.  Negative for  blurred vision and pain.  Respiratory: Positive for cough. Negative for hemoptysis, shortness of breath (improved) and wheezing.   Cardiovascular: Negative.  Negative for chest pain, palpitations and leg swelling.  Gastrointestinal: Negative.  Negative for abdominal pain, blood in stool, diarrhea, nausea and vomiting.  Genitourinary: Negative.  Negative for dysuria.  Musculoskeletal: Negative.  Negative for back pain.  Skin: Negative.   Neurological: Negative for dizziness, tremors, speech change, focal weakness, seizures and headaches.   Endo/Heme/Allergies: Negative.  Does not bruise/bleed easily.  Psychiatric/Behavioral: Negative.  Negative for depression, hallucinations and suicidal ideas.     PHYSICAL EXAMINATION:  GENERAL:  76 y.o.-year-old patient lying in the bed with no acute distress.  NECK:  Supple, no jugular venous distention. No thyroid enlargement, no tenderness.  LUNGS: Normal breath sounds bilaterally, no wheezing, rales,rhonchi  No use of accessory muscles of respiration.  CARDIOVASCULAR: S1, S2 normal. No murmurs, rubs, or gallops.  ABDOMEN: Soft, non-tender, non-distended. Bowel sounds present. No organomegaly or mass.  EXTREMITIES: No pedal edema, cyanosis, or clubbing.  PSYCHIATRIC: The patient is alert and oriented x 3.  SKIN: No obvious rash, lesion, or ulcer.   DATA REVIEW:   CBC  Recent Labs Lab 10/06/16 0248  WBC 4.8  HGB 12.7  HCT 37.2  PLT 140*    Chemistries   Recent Labs Lab 10/05/16 2052 10/06/16 0248  NA 133* 132*  K 3.7 3.4*  CL 103 104  CO2 21* 19*  GLUCOSE 128* 164*  BUN 12 12  CREATININE 1.16* 1.16*  CALCIUM 8.9 8.4*  AST 64*  --   ALT 42  --   ALKPHOS 54  --   BILITOT 0.6  --     Cardiac Enzymes  Recent Labs Lab 10/05/16 2339 10/06/16 0248 10/06/16 0839  TROPONINI 0.03* 0.04* <0.03    Microbiology Results  @  RADIOLOGY:  Dg Chest 2 View  Result Date: 10/05/2016 CLINICAL DATA:  Progressive cough and shortness of breath. EXAM: CHEST  2 VIEW COMPARISON:  Report from chest radiograph 10/02/2016, images not available. Chest CT 11/27/2014 FINDINGS: Heart is normal in size. 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 No pulmonary edema. Mild bronchial thickening. Minimal streaky right infrahilar atelectasis. No pleural fluid or pneumothorax. No acute osseous abnormalities. IMPRESSION: Bronchial thickening.  Minimal streaky right infrahilar atelectasis. Electronically Signed   By: Rubye Oaks M.D.   On: 10/05/2016 21:38   Ct Angio  Chest Pe W Or Wo Contrast  Result Date: 10/05/2016 CLINICAL DATA:  Worsening bronchitis EXAM: CT ANGIOGRAPHY CHEST WITH CONTRAST TECHNIQUE: Multidetector CT imaging of the chest was performed using the standard protocol during bolus administration of intravenous contrast. Multiplanar CT image reconstructions and MIPs were obtained to evaluate the vascular anatomy. CONTRAST:  75 cc Isovue 370 IV COMPARISON:  None. FINDINGS: Cardiovascular: Normal branch pattern of the great vessels. Aortic atherosclerosis without dissection. No pulmonary embolus. The cardiac chambers are within normal limits for size. Trace anterior pericardial effusion or thickening. Mediastinum/Nodes: Small hiatal hernia. No adenopathy. Patent trachea and mainstem bronchi. No thyromegaly or mass. The esophagus is grossly unremarkable. Lungs/Pleura: 3 mm right upper lobe subpleural nodule, series 6, image 27. Dependent atelectasis bilaterally. No pneumonic consolidation. Tiny blebs and bulla at the lung bases. Mild interstitial prominence with peribronchial thickening consistent with bronchitic change. Upper Abdomen: Calcification along the anterior aspect of the spleen consistent with a granuloma. No adrenal mass. The visualized kidneys, pancreas and liver are unremarkable. The gallbladder is nondistended and free of stones. No  acute bowel inflammation or obstruction. Musculoskeletal: No chest wall abnormality. No acute or significant osseous findings. Review of the MIP images confirms the above findings. IMPRESSION: 1. 3 mm right upper lobe subpleural pulmonary nodule. No follow-up needed if patient is low-risk. Non-contrast chest CT can be considered in 12 months if patient is high-risk. This recommendation follows the consensus statement: Guidelines for Management of Incidental Pulmonary Nodules Detected on CT Images: From the Fleischner Society 2017; Radiology 2017; 284:228-243. 2. Dependent atelectasis and mild bronchitic change of the  lungs. No alveolar consolidation or pneumothorax. 3. Aortic atherosclerosis. 4. Small hiatal hernia. Electronically Signed   By: Tollie Eth M.D.   On: 10/05/2016 23:32      Current Discharge Medication List    START taking these medications   Details  amoxicillin-clavulanate (AUGMENTIN) 875-125 MG tablet Take 1 tablet by mouth 2 (two) times daily. Qty: 8 tablet, Refills: 0    dextromethorphan (DELSYM) 30 MG/5ML liquid Take 5 mLs (30 mg total) by mouth 2 (two) times daily. Qty: 89 mL, Refills: 0    menthol-cetylpyridinium (CEPACOL) 3 MG lozenge Take 1 lozenge (3 mg total) by mouth every 2 (two) hours as needed for sore throat. Qty: 100 tablet, Refills: 12    predniSONE (DELTASONE) 20 MG tablet Take 2 tablets (40 mg total) by mouth daily with breakfast. Qty: 6 tablet, Refills: 0      CONTINUE these medications which have NOT CHANGED   Details  albuterol-ipratropium (COMBIVENT) 18-103 MCG/ACT inhaler Inhale into the lungs every 4 (four) hours.    FLUoxetine (PROZAC) 20 MG tablet Take 20 mg by mouth daily.    losartan (COZAAR) 50 MG tablet Take 50 mg by mouth daily.    pantoprazole (PROTONIX) 40 MG tablet Take 40 mg by mouth daily.      STOP taking these medications     azithromycin (ZITHROMAX) 250 MG tablet      cefdinir (OMNICEF) 300 MG capsule            Management plans discussed with the patient and she is in agreement. Stable for discharge home  Patient should follow up with pcp  CODE STATUS:     Code Status Orders        Start     Ordered   10/06/16 0244  Full code  Continuous     10/06/16 0243    Code Status History    Date Active Date Inactive Code Status Order ID Comments User Context   This patient has a current code status but no historical code status.      TOTAL TIME TAKING CARE OF THIS PATIENT: 3 minutes.    Note: This dictation was prepared with Dragon dictation along with smaller phrase technology. Any transcriptional errors that  result from this process are unintentional.  Galadriel Shroff M.D on 10/07/2016 at 7:47 AM  Between 7am to 6pm - Pager - [phone] After 6pm go to [email or web address] - Social research officer, government  [facility]  Office  [phone]  CC: Primary care physician; Patrice Paradise, MD

## 2016-10-08 NOTE — Progress Notes (Signed)
   10/06/16 1219  PT Time Calculation  PT Start Time (ACUTE ONLY) 0915  PT Stop Time (ACUTE ONLY) 0940  PT Time Calculation (min) (ACUTE ONLY) 25 min  PT G-Codes **NOT FOR INPATIENT CLASS**  Functional Assessment Tool Used AM-PAC 6 Clicks Basic Mobility  Functional Limitation Mobility: Walking and moving around  Mobility: Walking and Moving Around Current Status (Z6109) CJ  Mobility: Walking and Moving Around Goal Status (U0454) CI  PT General Charges  $$ ACUTE PT VISIT 1 Procedure  PT Evaluation  $PT Eval Low Complexity 1 Procedure  PT Treatments  $Gait Training 8-22 mins  $Therapeutic Exercise 8-22 mins   Late G Code entry after chart review  Myrene Galas, PT DPT 10/08/16, 8:59 AM [phone]

## 2016-10-09 LAB — CULTURE, RESPIRATORY W GRAM STAIN: Special Requests: NORMAL

## 2016-10-09 LAB — CULTURE, RESPIRATORY: CULTURE: NORMAL

## 2017-09-10 ENCOUNTER — Ambulatory Visit: Payer: Medicare Other | Attending: Geriatric Medicine

## 2017-09-10 DIAGNOSIS — M6281 Muscle weakness (generalized): Secondary | ICD-10-CM | POA: Diagnosis present

## 2017-09-10 DIAGNOSIS — R262 Difficulty in walking, not elsewhere classified: Secondary | ICD-10-CM

## 2017-09-10 DIAGNOSIS — Z9181 History of falling: Secondary | ICD-10-CM | POA: Diagnosis not present

## 2017-09-10 DIAGNOSIS — R2681 Unsteadiness on feet: Secondary | ICD-10-CM

## 2017-09-10 NOTE — Therapy (Signed)
[facility] [address], [place], [address] Phone: [phone]   Fax:  [phone]  Physical Therapy Evaluation  Patient Details  Name: Kara Pennington MRN: [id number] Date of Birth: 29-Apr-1941 Referring Provider: Tomasita Crumble, MD   Encounter Date: 09/10/2017  PT End of Session - 09/10/17 1524    Visit Number  1    Number of Visits  13    Date for PT Re-Evaluation  10/24/17    PT Start Time  1524    PT Stop Time  1628    PT Time Calculation (min)  64 min    Equipment Utilized During Treatment  Gait belt    Activity Tolerance  Patient tolerated treatment well    Behavior During Therapy  [facility] for tasks assessed/performed       Past Medical History:  Diagnosis Date  . Anxiety   . Depression   . GERD (gastroesophageal reflux disease)   . Hypertension     Past Surgical History:  Procedure Laterality Date  . APPENDECTOMY    . PARATHYROIDECTOMY      There were no vitals filed for this visit.   Subjective Assessment - 09/10/17 1527    Subjective  Back pain: 0/10 currently. 3/10 at most during the last 3 months, mainly getting up in the morning.     Pertinent History  Balance. Pt had lots of falls. Has not had one in the last 2 months. Prior to that, pt had a fall coming out of church, or when just standing. Does not know why. Pt just feels sore and bruised, has not broken any bones during her recent falls. 5 years ago, pt fell and had a concussion. Other than that, she just has bruising. Currently afraid of falling and breaking a hip. No dizziness or lightheadedness. Everything is fine but the next thing she knows, she is on the floor or sidewalk. Pt states that she does not have any stamina or energy. Cannot walk real far without getting short of breath.  Does not have severe back pain. Can't straighten up when she gets up in the morning. Has a hx of L4 and L5 herniated discs. Had PT in the past for it.  For  most of adult life, she would have episodes of acute back pain which would improve for 9-10 months. Now feels a constant dull ache since about 5 years ago.   Falls began about 5 years ago but has fallen more frequently in the last 6 months.  Pt is retired and volunteers a lot. But when at home pt tends to lie down and watch TV, Netflix. Pt feels out of shape and feels like she cannot go long without getting out of breath. Dreads walking her golden Chiropractor.   Pt states she did aquatherapy before and did a lot of stuff in the pool but when she got out of the pool, her legs felt like rubber.     Patient Stated Goals  Pt expresses desire to not fall.     Currently in Pain?  No/denies    Pain Location  Back    Aggravating Factors   Does not know what causes her falls         [facility] PT Assessment - 09/10/17 1525      Assessment   Medical Diagnosis  Balance Problem    Referring Provider  Tomasita Crumble, MD    Onset Date/Surgical Date  08/13/17 Date  referral signed. Chronic condition    Prior Therapy  Pt participated in PT before for back pain with some improvement       Precautions   Precautions  Fall      Restrictions   Other Position/Activity Restrictions  No known weight bearing restrictions      Balance Screen   Has the patient fallen in the past 6 months  Yes    How many times?  Multiple times. See subjective    Has the patient had a decrease in activity level because of a fear of falling?   Yes    Is the patient reluctant to leave their home because of a fear of falling?   Yes      Prior Function   Vocation  Retired;Volunteer work    Leisure  watch TV, netflix      Observation/Other Assessments   Focus on Therapeutic Outcomes (FOTO)   46    Lower Extremity Functional Scale   46      Posture/Postural Control   Posture Comments  slight L lateral shift, decreased bilateral hip extension, slight R cervical side bend, bilaterally protracted shoulders and neck.       AROM    Lumbar Flexion  WFL with low back discomfort.     Lumbar Extension  limited    Lumbar - Right Side Bend  WFL    Lumbar - Left Side [facility] but with central low back pain    Lumbar - Right Rotation  WFL    Lumbar - Left Rotation  [facility]      Strength   Right Hip Flexion  4/5    Right Hip Extension  4-/5 seated manually resisted leg press    Right Hip ABduction  3+/5    Left Hip Flexion  4-/5    Left Hip Extension  4/5 seated manually resisted leg press    Left Hip ABduction  3+/5    Right Knee Flexion  5/5    Right Knee Extension  5/5    Left Knee Flexion  5/5    Left Knee Extension  5/5    Right Ankle Dorsiflexion  4/5    Left Ankle Dorsiflexion  4/5      Ambulation/Gait   Gait Comments  decreased stance R LE, increased lumbopelvic movement, ipsilateral trunk lean during stance phase      Berg Balance Test   Sit to Stand  Able to stand  independently using hands    Standing Unsupported  Able to stand safely 2 minutes    Sitting with Back Unsupported but Feet Supported on Floor or Stool  Able to sit safely and securely 2 minutes    Stand to Sit  Controls descent by using hands    Transfers  Able to transfer safely, minor use of hands    Standing Unsupported with Eyes Closed  Able to stand 10 seconds safely    Standing Ubsupported with Feet Together  Needs help to attain position and unable to hold for 15 seconds able to maintain stand about 19 sec then increase sway + LOB    From Standing, Reach Forward with Outstretched Arm  Can reach forward >12 cm safely (5")    From Standing Position, Pick up Object from Floor  Able to pick up shoe safely and easily    From Standing Position, Turn to Look Behind Over each Shoulder  Needs assist to keep from losing balance and falling LOB after returning from R  rotation    Turn 360 Degrees  Able to turn 360 degrees safely but slowly    Standing Unsupported, Alternately Place Feet on Step/Stool  Able to stand independently and safely and complete  8 steps in 20 seconds    Standing Unsupported, One Foot in Front  Able to take small step independently and hold 30 seconds    Standing on One Leg  Unable to try or needs assist to prevent fall Unable to perform R LE; LOB after 6 sec on L LE    Total Score  37    Berg comment:  < 46/56 suggests increased need for AD and increased fall risk.               Objective measurements completed on examination: See above findings.   Pt states blood pressure is controlled.  Blood pressure L arm sitting, mechanically taken, normal cuff: 149/77, HR 84  Pt states using her SPC when walking long distances.     Ther-ex   Directed patient with supine clamshells red band 10x3  Reviewed and given as part of her HEP. Pt demonstrated and verbalized understanding. Handout provided.   side stepping with bilateral UE assist 5 ft to the L and 5 ft to the R 5x to promote glute med strengthening. Pt fatigue. 98 % SpO2, room air.    Improved exercise technique, movement at target joints, use of target muscles after mod verbal, visual, tactile cues.    Pt was recommended to use a SPC for safety secondary to low Berg Balance score.    Pt demonstrates tendency to lose balance when using R LE more based on LOB when returning from looking back on the R side and SLS on R LE during the Berg Balance test.    Plan of care: 2x/week for 6 weeks first 2 follow up visit is land therapy.,  Then aqua therapy, then last week is land therapy if able.   Patient is a 77 year old female who came to physical therapy secondary to balance difficulties and falls. She also presents with back pain/discomfort, altered gait pattern and posture, bilateral hip weakness, decreased activity tolerance/endurance, decreased lumbopelvic control when walking, low Berg Balance score suggesting need for AD and increased fall risk, and difficulty performing standing tasks such as walking secondary to fatigue and falls. Pt will benefit from  skilled physical therapy services to address the aforementioned deficits.          PT Education - 09/10/17 1750    Education provided  Yes    Education Details  ther-ex, HEP, plan of care    Person(s) Educated  Patient    Methods  Explanation;Demonstration;Tactile cues;Verbal cues;Handout    Comprehension  Returned demonstration;Verbalized understanding          PT Long Term Goals - 09/10/17 1706      PT LONG TERM GOAL #1   Title  Patient will improve her Berg Balance test score to at least 46/56 as a demonstration of improved balance, and decreased fall risk.     Baseline  37/56 (09/10/2017)    Time  6    Period  Weeks    Status  New    Target Date  10/24/17      PT LONG TERM GOAL #2   Title  Patient will improve bilateral hip strength by at least 1/2 MMT to promote ability to ambulate, and perform standing tasks.     Time  6    Period  Weeks    Status  New    Target Date  10/24/17      PT LONG TERM GOAL #3   Title  Pt will improve her FOTO score to 53 or more as a demonstration of improved function.     Baseline  46 (09/10/2017)    Time  6    Period  Weeks    Status  New    Target Date  10/24/17      PT LONG TERM GOAL #4   Title  Patient will improve her LEFS score by at least 9 points as a demonstration of improved function.     Baseline  46/80 (09/10/2017)    Time  6    Period  Weeks    Status  New    Target Date  10/24/17             Plan - 09/10/17 1650    Clinical Impression Statement  Patient is a 77 year old female who came to physical therapy secondary to balance difficulties and falls. She also presents with back pain/discomfort, altered gait pattern and posture, bilateral hip weakness, decreased activity tolerance/endurance, decreased lumbopelvic control when walking, low Berg Balance score suggesting need for AD and increased fall risk, and difficulty performing standing tasks such as walking secondary to fatigue and falls. Pt will benefit from  skilled physical therapy services to address the aforementioned deficits.     History and Personal Factors relevant to plan of care:  Chronicity of condition, increased falls, deconditioning, hip weakness, back pain/discomfort    Clinical Presentation  Stable    Clinical Presentation due to:  Has not had a fall for the last 2 months per pt reports.     Clinical Decision Making  Low    Rehab Potential  Fair    Clinical Impairments Affecting Rehab Potential  Chronicity of condition, increased falls, deconditioning, hip weakness, back pain/discomfort, motivation level for exercise    PT Frequency  2x / week    PT Duration  6 weeks    PT Treatment/Interventions  Aquatic Therapy;Electrical Stimulation;Iontophoresis 4mg /ml Dexamethasone;Traction;Ultrasound;Gait training;Therapeutic activities;Therapeutic exercise;Balance training;Neuromuscular re-education;Patient/family education;Manual techniques;Dry needling traction if appropriate    PT Next Visit Plan  LE strengthening, increase activity tolerance.     Consulted and Agree with Plan of Care  Patient       Patient will benefit from skilled therapeutic intervention in order to improve the following deficits and impairments:  Postural dysfunction, Pain, Improper body mechanics, Difficulty walking, Decreased strength, Decreased endurance, Decreased balance, Decreased activity tolerance  Visit Diagnosis: History of falling - Plan: PT plan of care cert/re-cert  Unsteadiness on feet - Plan: PT plan of care cert/re-cert  Muscle weakness (generalized) - Plan: PT plan of care cert/re-cert  Difficulty in walking, not elsewhere classified - Plan: PT plan of care cert/re-cert     Problem List Patient Active Problem List   Diagnosis Date Noted  . Acute bronchitis 10/06/2016    Loralyn FreshwaterMiguel Laygo PT, DPT   09/10/2017, 6:57 PM  [facility] [name] [place], [place], [id number] Phone:  [phone]   Fax:  [phone]  Name: Kara Pennington MRN: [id number] Date of Birth: 1941/02/18

## 2017-09-10 NOTE — Patient Instructions (Signed)
Supine clamshells   Lying on your back, knees bent, and elastic band around your thighs  Squeeze your rear end muscles together and stretch the band out with your thighs    Hold for 5 seconds  Repeat 10 times  Perform 3 sets

## 2017-09-16 ENCOUNTER — Ambulatory Visit: Payer: Medicare Other

## 2017-09-16 DIAGNOSIS — Z9181 History of falling: Secondary | ICD-10-CM

## 2017-09-16 DIAGNOSIS — M6281 Muscle weakness (generalized): Secondary | ICD-10-CM

## 2017-09-16 DIAGNOSIS — R262 Difficulty in walking, not elsewhere classified: Secondary | ICD-10-CM

## 2017-09-16 DIAGNOSIS — R2681 Unsteadiness on feet: Secondary | ICD-10-CM

## 2017-09-16 NOTE — Therapy (Signed)
[facility] [address] [place], [address] Phone: [phone]   Fax:  [phone]  Physical Therapy Treatment  Patient Details  Name: Kara Pennington MRN: [id number] Date of Birth: [birth year] Referring Provider: Tomasita Crumble, MD   Encounter Date: 09/16/2017  PT End of Session - 09/16/17 1308    Visit Number  2    Number of Visits  13    Date for PT Re-Evaluation  10/24/17    PT Start Time  1308 pt arrived late    PT Stop Time  1341    PT Time Calculation (min)  33 min    Equipment Utilized During Treatment  Gait belt    Activity Tolerance  Patient tolerated treatment well    Behavior During Therapy  [facility] for tasks assessed/performed       Past Medical History:  Diagnosis Date  . Anxiety   . Depression   . GERD (gastroesophageal reflux disease)   . Hypertension     Past Surgical History:  Procedure Laterality Date  . APPENDECTOMY    . PARATHYROIDECTOMY      There were no vitals filed for this visit.  Subjective Assessment - 09/16/17 1308    Subjective  I'm just dragging. Back is hurting this morning. Could not straighten up after getting up in the morning. No falls recenty. No back pain right now.     Pertinent History  Balance. Pt had lots of falls. Has not had one in the last 2 months. Prior to that, pt had a fall coming out of church, or when just standing. Does not know why. Pt just feels sore and bruised, has not broken any bones during her recent falls. 5 years ago, pt fell and had a concussion. Other than that, she just has bruising. Currently afraid of falling and breaking a hip. No dizziness or lightheadedness. Everything is fine but the next thing she knows, she is on the floor or sidewalk. Pt states that she does not have any stamina or energy. Cannot walk real far without getting short of breath.  Does not have severe back pain. Can't straighten up when she gets up in the morning. Has a hx  of L4 and L5 herniated discs. Had PT in the past for it.  For most of adult life, she would have episodes of acute back pain which would improve for 9-10 months. Now feels a constant dull ache since about 5 years ago.   Falls began about 5 years ago but has fallen more frequently in the last 6 months.  Pt is retired and volunteers a lot. But when at home pt tends to lie down and watch TV, Netflix. Pt feels out of shape and feels like she cannot go long without getting out of breath. Dreads walking her golden Chiropractor.   Pt states she did aquatherapy before and did a lot of stuff in the pool but when she got out of the pool, her legs felt like rubber.     Patient Stated Goals  Pt expresses desire to not fall.     Currently in Pain?  No/denies                              PT Education - 09/16/17 1316    Education provided  Yes    Education Details  ther-ex    [employer]) Educated  Patient  Methods  Explanation;Demonstration;Tactile cues;Verbal cues    Comprehension  Returned demonstration;Verbalized understanding        Objectives  Pt states that she got her lungs checked and was told that there was nothing wrong with her lungs.      Ther-ex   Directed patient with seated anterior/posterior pelvic tilts 10x3   Seated bilateral shoulder extension isometrics, hands on thighs 5x5 seconds for 2 sets to promote trunk muscle use and decrease pressure to low back  Standing glute max squeeze with bilateral UE assist 5x5 seconds for 3 sets  side stepping with bilateral UE assist 5 ft to the L and 5 ft to the R 6x to promote glute med strengthening.     99% SpO2 Room Air  Forward step up onto Air Ex pad with on UE assist 4x each LE    Then 5x each LE with light touch assist. Difficulty supporting herself with one LE resulting in unsteadiness.   SLS with bilateral UE assist   5x5 seconds for 2 sets each LE   Improved exercise technique, movement at target  joints, use of target muscles after min to mod verbal, visual, tactile cues.     Therapeutic rest breaks secondary to fatigue to allow recovery from exercise.   Worked on low back mobility and hip strengthening to help decrease back pain as well as improving LE strength to promote ability to support herself during SLS stance phase of standing activities. Therapeutic rest breaks provided secondary to fatigue.          PT Long Term Goals - 09/10/17 1706      PT LONG TERM GOAL #1   Title  Patient will improve her Berg Balance test score to at least 46/56 as a demonstration of improved balance, and decreased fall risk.     Baseline  37/56 (09/10/2017)    Time  6    Period  Weeks    Status  New    Target Date  10/24/17      PT LONG TERM GOAL #2   Title  Patient will improve bilateral hip strength by at least 1/2 MMT to promote ability to ambulate, and perform standing tasks.     Time  6    Period  Weeks    Status  New    Target Date  10/24/17      PT LONG TERM GOAL #3   Title  Pt will improve her FOTO score to 53 or more as a demonstration of improved function.     Baseline  46 (09/10/2017)    Time  6    Period  Weeks    Status  New    Target Date  10/24/17      PT LONG TERM GOAL #4   Title  Patient will improve her LEFS score by at least 9 points as a demonstration of improved function.     Baseline  46/80 (09/10/2017)    Time  6    Period  Weeks    Status  New    Target Date  10/24/17            Plan - 09/16/17 1318    Clinical Impression Statement  Worked on low back mobility and hip strengthening to help decrease back pain as well as improving LE strength to promote ability to support herself during SLS stance phase of standing activities. Therapeutic rest breaks provided secondary to fatigue.     Rehab Potential  Fair  Clinical Impairments Affecting Rehab Potential  Chronicity of condition, increased falls, deconditioning, hip weakness, back pain/discomfort,  motivation level for exercise    PT Frequency  2x / week    PT Duration  6 weeks    PT Treatment/Interventions  Aquatic Therapy;Electrical Stimulation;Iontophoresis /ml Dexamethasone;Traction;Ultrasound;Gait training;Therapeutic activities;Therapeutic exercise;Balance training;Neuromuscular re-education;Patient/family education;Manual techniques;Dry needling traction if appropriate    PT Next Visit Plan  LE strengthening, increase activity tolerance.     Consulted and Agree with Plan of Care  Patient       Patient will benefit from skilled therapeutic intervention in order to improve the following deficits and impairments:  Postural dysfunction, Pain, Improper body mechanics, Difficulty walking, Decreased strength, Decreased endurance, Decreased balance, Decreased activity tolerance  Visit Diagnosis: History of falling  Unsteadiness on feet  Muscle weakness (generalized)  Difficulty in walking, not elsewhere classified     Problem List Patient Active Problem List   Diagnosis Date Noted  . Acute bronchitis 10/06/2016    Loralyn Freshwater PT, DPT   09/16/2017, 7:10 PM  [place] SPORTS MEDICINE [address] [place], [address] Phone: [phone]   Fax:  [phone]  Name: Kara Pennington MRN: [id number] Date of Birth: [birth year]

## 2017-09-16 NOTE — Patient Instructions (Signed)
  Standing glute max squeeze   Standing and holding onto something sturdy for safety    Squeeze your rear end muscles together.    Count out loud for 5 seconds.     Repeat 5 times   Perform 3 sets twice daily.

## 2017-09-17 ENCOUNTER — Ambulatory Visit: Payer: Medicare Other

## 2017-09-18 ENCOUNTER — Ambulatory Visit: Payer: Medicare Other

## 2017-09-18 DIAGNOSIS — R2681 Unsteadiness on feet: Secondary | ICD-10-CM

## 2017-09-18 DIAGNOSIS — Z9181 History of falling: Secondary | ICD-10-CM

## 2017-09-18 DIAGNOSIS — M6281 Muscle weakness (generalized): Secondary | ICD-10-CM
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
## 2017-09-18 NOTE — Patient Instructions (Signed)
   ABDUCTION: Standing (Active)    Hold onto something sturdy for support. Stand, feet flat. Lift right leg out to side to pain-free range  Hold for 5 seconds .  Complete _2__ sets of __5_ repetitions. Perform __1_ sessions per day.   Repeat for your other side.   http://gtsc.exer.us/111   Copyright  VHI. All rights reserved.

## 2017-09-23 ENCOUNTER — Ambulatory Visit: Payer: Medicare Other

## 2017-09-23 DIAGNOSIS — Z9181 History of falling: Secondary | ICD-10-CM

## 2017-09-23 DIAGNOSIS — R2681 Unsteadiness on feet: Secondary | ICD-10-CM

## 2017-09-23 DIAGNOSIS — R262 Difficulty in walking, not elsewhere classified: Secondary | ICD-10-CM

## 2017-09-23 DIAGNOSIS — M6281 Muscle weakness (generalized): Secondary | ICD-10-CM

## 2017-09-23 NOTE — Therapy (Signed)
[place] [facility]TS MEDICINE [address], [place], [address] Phone: [phone]   Fax:  [phone]  Physical Therapy Treatment  Patient Details  Name: Kara Pennington MRN: [id number] Date of Birth: [birth year] Referring Provider: Tomasita Crumble, MD   Encounter Date: 09/23/2017  PT End of Session - 09/23/17 1648    Visit Number  4    Number of Visits  13    Date for PT Re-Evaluation  10/24/17    PT Start Time  1648    PT Stop Time  1731    PT Time Calculation (min)  43 min    Equipment Utilized During Treatment  Gait belt    Activity Tolerance  Patient tolerated treatment well    Behavior During Therapy  [facility] for tasks assessed/performed       Past Medical History:  Diagnosis Date  . Anxiety   . Depression   . GERD (gastroesophageal reflux disease)   . Hypertension     Past Surgical History:  Procedure Laterality Date  . APPENDECTOMY    . PARATHYROIDECTOMY      There were no vitals filed for this visit.  Subjective Assessment - 09/23/17 1651    Subjective  Pt states that her balance seems ok. Pt states that performing her standing hip abduction for L side bothers her R hip joint. Does not bother her L hip when she performs it for her R hip.  Walking feels achy for her low back.  Back does not feel achy at the moment.     Pertinent History  Balance. Pt had lots of falls. Has not had one in the last 2 months. Prior to that, pt had a fall coming out of church, or when just standing. Does not know why. Pt just feels sore and bruised, has not broken any bones during her recent falls. 5 years ago, pt fell and had a concussion. Other than that, she just has bruising. Currently afraid of falling and breaking a hip. No dizziness or lightheadedness. Everything is fine but the next thing she knows, she is on the floor or sidewalk. Pt states that she does not have any stamina or energy. Cannot walk real far without getting short of  breath.  Does not have severe back pain. Can't straighten up when she gets up in the morning. Has a hx of L4 and L5 herniated discs. Had PT in the past for it.  For most of adult life, she would have episodes of acute back pain which would improve for 9-10 months. Now feels a constant dull ache since about 5 years ago.   Falls began about 5 years ago but has fallen more frequently in the last 6 months.  Pt is retired and volunteers a lot. But when at home pt tends to lie down and watch TV, Netflix. Pt feels out of shape and feels like she cannot go long without getting out of breath. Dreads walking her golden Chiropractor.   Pt states she did aquatherapy before and did a lot of stuff in the pool but when she got out of the pool, her legs felt like rubber.     Patient Stated Goals  Pt expresses desire to not fall.     Currently in Pain?  No/denies                              PT Education -  09/23/17 1712    Education provided  Yes    Education Details  ther-ex, HEP    Person(s) Educated  Patient    Methods  Explanation;Demonstration;Tactile cues;Verbal cues;Handout    Comprehension  Returned demonstration;Verbalized understanding         Objectives  Pt states that she wants to walk longer distances to be abe to [place] the [place].    Manual therapy  Supine STM to R hip flexor muscles, leg straight with opposite LE in hooklying supine STM to L hip flexor muscles, leg straight with opposite LE in hooklying  To promote hip extension and decrease low back pressure in standing.    Ther-ex  Blood pressure L arm sitting 137/68, HR 93   Supine glute max squeeze with one leg straight, the other leg in hooklying position 10x5 seconds each LE  Seated thoracic extension on chair 10x2 with 5 seconds to help decrease low back extension pressure.   Side stepping with bilateral UE assist 5 ft to the L and 5 ft to the R 5x to promote glute med muscle  strengthening   Forward wedding march 32 ft x 2 to promote glute muscle use  Forward step up onto Air Ex pad with on UE assist 10x each LE  lateral step up onto Air Ex pad with bilateral UE assist 3x each side.   Therapeutic rest breaks provided secondary to fatigue   Check hip IR ROM next visit if appropriate.    Improved exercise technique, movement at target joints, use of target muscles after min to mod verbal, visual, tactile cues.    Worked on improving hip and thoracic extension to help decrease extension pressure to low back when standing and walking. Continued working on NIKEglute strengthening and balance related activities using Air Ex pad to help decrease fall risk. Therapeutic rest breaks provided secondary to fatigue from exercises.     PT Long Term Goals - 09/10/17 1706      PT LONG TERM GOAL #1   Title  Patient will improve her Berg Balance test score to at least 46/56 as a demonstration of improved balance, and decreased fall risk.     Baseline  37/56 (09/10/2017)    Time  6    Period  Weeks    Status  New    Target Date  10/24/17      PT LONG TERM GOAL #2   Title  Patient will improve bilateral hip strength by at least 1/2 MMT to promote ability to ambulate, and perform standing tasks.     Time  6    Period  Weeks    Status  New    Target Date  10/24/17      PT LONG TERM GOAL #3   Title  Pt will improve her FOTO score to 53 or more as a demonstration of improved function.     Baseline  46 (09/10/2017)    Time  6    Period  Weeks    Status  New    Target Date  10/24/17      PT LONG TERM GOAL #4   Title  Patient will improve her LEFS score by at least 9 points as a demonstration of improved function.     Baseline  46/80 (09/10/2017)    Time  6    Period  Weeks    Status  New    Target Date  10/24/17  Plan - 09/23/17 1646    Clinical Impression Statement  Worked on improving hip and thoracic extension to help decrease extension pressure to  low back when standing and walking. Continued working on [employer] and balance related activities using Air Ex pad to help decrease fall risk. Therapeutic rest breaks provided secondary to fatigue from exercises.     Rehab Potential  Fair    Clinical Impairments Affecting Rehab Potential  Chronicity of condition, increased falls, deconditioning, hip weakness, back pain/discomfort, motivation level for exercise    PT Frequency  2x / week    PT Duration  6 weeks    PT Treatment/Interventions  Aquatic Therapy;Electrical Stimulation;Iontophoresis 4mg /ml Dexamethasone;Traction;Ultrasound;Gait training;Therapeutic activities;Therapeutic exercise;Balance training;Neuromuscular re-education;Patient/family education;Manual techniques;Dry needling traction if appropriate    PT Next Visit Plan  LE strengthening, increase activity tolerance.     Consulted and Agree with Plan of Care  Patient       Patient will benefit from skilled therapeutic intervention in order to improve the following deficits and impairments:  Postural dysfunction, Pain, Improper body mechanics, Difficulty walking, Decreased strength, Decreased endurance, Decreased balance, Decreased activity tolerance  Visit Diagnosis: History of falling  Unsteadiness on feet  Muscle weakness (generalized)  Difficulty in walking, not elsewhere classified     Problem List Patient Active Problem List   Diagnosis Date Noted  . Acute bronchitis 10/06/2016    Loralyn Freshwater PT, DPT   09/23/2017, 5:46 PM  [place] [facility] PHYSICAL AND SPORTS MEDICINE [address] [place], [address] Phone: [phone]   Fax:  [phone]  Name: Kara Pennington MRN: [id number] Date of Birth: [birth year]

## 2017-09-23 NOTE — Patient Instructions (Addendum)
(  Home) Extension: Thoracic With Lumbar Lock - Sitting    Chair back against the wall   Sit with back against chair, knees bent, hands folded across (not shown). Extend trunk over chair back. Hold position for __5__ seconds.  Repeat ___10  times per set. Do _2___ sets per session daily.   Copyright  VHI. All rights reserved.

## 2017-09-25 ENCOUNTER — Ambulatory Visit: Payer: Medicare Other

## 2017-09-30 ENCOUNTER — Ambulatory Visit: Payer: Medicare Other

## 2017-09-30 DIAGNOSIS — M6281 Muscle weakness (generalized): Secondary | ICD-10-CM

## 2017-09-30 DIAGNOSIS — Z9181 History of falling: Secondary | ICD-10-CM

## 2017-09-30 DIAGNOSIS — R2681 Unsteadiness on feet: Secondary | ICD-10-CM

## 2017-09-30 DIAGNOSIS — R262 Difficulty in walking, not elsewhere classified: Secondary | ICD-10-CM

## 2017-09-30 NOTE — Therapy (Signed)
[place] [facility]TS MEDICINE [address], [place], [address] Phone: [phone]   Fax:  [phone]  Physical Therapy Treatment  Patient Details  Name: Kara Pennington MRN: [id number] Date of Birth: October 27, 1940 Referring Provider: Tomasita Crumble, MD   Encounter Date: 09/30/2017  PT End of Session - 09/30/17 1515    Visit Number  5    Number of Visits  13    Date for PT Re-Evaluation  10/24/17    PT Start Time  1516    PT Stop Time  1558    PT Time Calculation (min)  42 min    Equipment Utilized During Treatment  Gait belt    Activity Tolerance  Patient tolerated treatment well    Behavior During Therapy  [facility] for tasks assessed/performed       Past Medical History:  Diagnosis Date  . Anxiety   . Depression   . GERD (gastroesophageal reflux disease)   . Hypertension     Past Surgical History:  Procedure Laterality Date  . APPENDECTOMY    . PARATHYROIDECTOMY      There were no vitals filed for this visit.  Subjective Assessment - 09/30/17 1517    Subjective  Pt states feeling nauseated last Wednesday. Thought she could sleep it off.  Never threw up. Feels like she is allergic to lobster after eating it last Thursday.  Has not thrown up since Friday morning. Did not feel like she had a fever.  No falls recently. Sort of lost her balance in the shower last week but caught herself. Pt was just showering and felt herself tipping backwards just enough to startle her.  Unable to put grabbars in her bathroom due to fiberglass.   Standing and walking increases the dull ache in her back.   Does not feel nauseated. Feels like she is dragging around and does not have much energy. Ate lunch (chicken pie, green beans, okra and dessert).     Pertinent History  Balance. Pt had lots of falls. Has not had one in the last 2 months. Prior to that, pt had a fall coming out of church, or when just standing. Does not know why. Pt just feels  sore and bruised, has not broken any bones during her recent falls. 5 years ago, pt fell and had a concussion. Other than that, she just has bruising. Currently afraid of falling and breaking a hip. No dizziness or lightheadedness. Everything is fine but the next thing she knows, she is on the floor or sidewalk. Pt states that she does not have any stamina or energy. Cannot walk real far without getting short of breath.  Does not have severe back pain. Can't straighten up when she gets up in the morning. Has a hx of L4 and L5 herniated discs. Had PT in the past for it.  For most of adult life, she would have episodes of acute back pain which would improve for 9-10 months. Now feels a constant dull ache since about 5 years ago.   Falls began about 5 years ago but has fallen more frequently in the last 6 months.  Pt is retired and volunteers a lot. But when at home pt tends to lie down and watch TV, Netflix. Pt feels out of shape and feels like she cannot go long without getting out of breath. Dreads walking her golden Chiropractor.   Pt states she did aquatherapy before and did a lot of  stuff in the pool but when she got out of the pool, her legs felt like rubber.     Patient Stated Goals  Pt expresses desire to not fall.     Currently in Pain?  Yes Back dull ache    Pain Score  3  dull back ache                No data recorded               PT Education - 09/30/17 1551    Education provided  Yes    Education Details  ther-ex    [employer]) Educated  Patient    Methods  Explanation;Demonstration;Tactile cues;Verbal cues    Comprehension  Returned demonstration;Verbalized understanding         Objectives  Pt states that she wants to walk longer distances to be abe to [place] the [place].   FOTO visit 5/6  Ther-ex  Temperature taken: 97.9 degrees Blood pressure L arm sitting 116/62, HR 84  98 degrees SpO2  Supine hip 90/90 IR  42 degrees R, 37  degrees L  Supine muscle energy technique to promote L hip IR. Improved L hip IR to 46 degrees. No change in back pain/ache per pt.   standing with one foot on stair step, the other foot off: glute max squeeze to promote hip flexor stretch to decrease pressure to low back 10 seconds x 5 for 2 sets   Standing glute max squeeze 5x10 seconds   Seated thoracic extension over chair 10x5 seconds (pt states not performing exercise at home)  Forward wedding march 32 ft, then 15 ft to promote glute med/max muscle strengthening. Slight LOB when pt was walking and looking to the R  Static standing: R and L cervical rotation with holds. Slight increase in insteadiness with head is positioned in R cervical rotation.      Improved exercise technique, movement at target joints, use of target muscles after mod verbal, visual, tactile cues.   Continued working on hip extension, and glute med/max strengthening to help decrease low back extension pressure to hopefully increase balance. Slight increase in unsteadiness observed when pt maintains R cervical rotation. Pt was recommended to be aware when she looks to the R and see if she notices any balance challenges at home.       PT Long Term Goals - 09/10/17 1706      PT LONG TERM GOAL #1   Title  Patient will improve her Berg Balance test score to at least 46/56 as a demonstration of improved balance, and decreased fall risk.     Baseline  37/56 (09/10/2017)    Time  6    Period  Weeks    Status  New    Target Date  10/24/17      PT LONG TERM GOAL #2   Title  Patient will improve bilateral hip strength by at least 1/2 MMT to promote ability to ambulate, and perform standing tasks.     Time  6    Period  Weeks    Status  New    Target Date  10/24/17      PT LONG TERM GOAL #3   Title  Pt will improve her FOTO score to 53 or more as a demonstration of improved function.     Baseline  46 (09/10/2017)    Time  6    Period  Weeks    Status  New  Target Date  10/24/17      PT LONG TERM GOAL #4   Title  Patient will improve her LEFS score by at least 9 points as a demonstration of improved function.     Baseline  46/80 (09/10/2017)    Time  6    Period  Weeks    Status  New    Target Date  10/24/17            Plan - 09/30/17 1551    Clinical Impression Statement  Continued working on hip extension, and glute med/max strengthening to help decrease low back extension pressure to hopefully increase balance. Slight increase in unsteadiness observed when pt maintains R cervical rotation. Pt was recommended to be aware when she looks to the R and see if she notices any balance challenges at home.     Rehab Potential  Fair    Clinical Impairments Affecting Rehab Potential  Chronicity of condition, increased falls, deconditioning, hip weakness, back pain/discomfort, motivation level for exercise    PT Frequency  2x / week    PT Duration  6 weeks    PT Treatment/Interventions  Aquatic Therapy;Electrical Stimulation;Iontophoresis 4mg /ml Dexamethasone;Traction;Ultrasound;Gait training;Therapeutic activities;Therapeutic exercise;Balance training;Neuromuscular re-education;Patient/family education;Manual techniques;Dry needling traction if appropriate    PT Next Visit Plan  LE strengthening, increase activity tolerance.     Consulted and Agree with Plan of Care  Patient       Patient will benefit from skilled therapeutic intervention in order to improve the following deficits and impairments:  Postural dysfunction, Pain, Improper body mechanics, Difficulty walking, Decreased strength, Decreased endurance, Decreased balance, Decreased activity tolerance  Visit Diagnosis: History of falling  Unsteadiness on feet  Muscle weakness (generalized)  Difficulty in walking, not elsewhere classified     Problem List Patient Active Problem List   Diagnosis Date Noted  . Acute bronchitis 10/06/2016    Loralyn FreshwaterMiguel Clemie General PT, DPT   09/30/2017,  7:38 PM  [place] [facility] [address] , [place], [address] Phone: [phone]   Fax:  [phone]  Name: Saralyn PilarMyra S Yow MRN: [id number] Date of Birth: 19-Apr-1941

## 2017-10-02 ENCOUNTER — Ambulatory Visit: Payer: Medicare Other

## 2017-10-02 DIAGNOSIS — Z9181 History of falling: Secondary | ICD-10-CM | POA: Diagnosis not present

## 2017-10-02 DIAGNOSIS — R2681 Unsteadiness on feet: Secondary | ICD-10-CM

## 2017-10-02 DIAGNOSIS — R262 Difficulty in walking, not elsewhere classified: Secondary | ICD-10-CM

## 2017-10-02 DIAGNOSIS — M6281 Muscle weakness (generalized): Secondary | ICD-10-CM

## 2017-10-02 NOTE — Patient Instructions (Addendum)
Scapular Retraction: Rowing (Eccentric) - Arms - 45 Degrees (Resistance Band)   Hold end of band in each hand.   Squeeze shoulder blades together  Hold for 5 seconds.   Use _____yellow___ resistance band. _10__ reps per set, _3__ sets per day. Copyright  VHI. All rights reserved.      Upper Cervical Flexion / Extension   Do not follow first picture  Give yourself a "double chin."  Hold ___5_ seconds. Repeat _10___ times per set. Do 3____ sets per session. Do ___1_ sessions per day.  http://orth.exer.us/351   Copyright  VHI. All rights reserved.

## 2017-10-02 NOTE — Therapy (Signed)
[facility] PHYSICAL AND SPORTS MEDICINE [address] [place], [place], [address] Phone: [phone]   Fax:  [phone]  Physical Therapy Treatment  Patient Details  Name: Kara Pennington MRN: [id number] Date of Birth: 14-Jan-1941 Referring Provider: Tomasita CrumbleSusanna Thach, MD   Encounter Date: 10/02/2017  PT End of Session - 10/02/17 1525    Visit Number  6    Number of Visits  13    Date for PT Re-Evaluation  10/24/17    PT Start Time  1525 pt arrived late    PT Stop Time  1601    PT Time Calculation (min)  36 min    Equipment Utilized During Treatment  Gait belt    Activity Tolerance  Patient tolerated treatment well    Behavior During Therapy  [facility]       Past Medical History:  Diagnosis Date  . Anxiety   . Depression   . GERD (gastroesophageal reflux disease)   . Hypertension     Past Surgical History:  Procedure Laterality Date  . APPENDECTOMY    . PARATHYROIDECTOMY      There were no vitals filed for this visit.  Subjective Assessment - 10/02/17 1527    Subjective  Pt states feeling imobilized by depression. Just getting dressed is an effort.  Balance is about the same. Felt like she was going to fall a couple of days ago as she stepped up onto a curb. Pt recovered her balance by taking a few steps back secondary to feeling like she was going to tip over backwards.  No dizziness, room spinning or light headedness.  Pt states having a SPC, uses her SPC if she has to walk far.  Has not been using it to walk her dog.     Pertinent History  Balance. Pt had lots of falls. Has not had one in the last 2 months. Prior to that, pt had a fall coming out of church, or when just standing. Does not know why. Pt just feels sore and bruised, has not broken any bones during her recent falls. 5 years ago, pt fell and had a concussion. Other than that, she just has bruising. Currently afraid of falling and breaking a hip. No  dizziness or lightheadedness. Everything is fine but the next thing she knows, she is on the floor or sidewalk. Pt states that she does not have any stamina or energy. Cannot walk real far without getting short of breath.  Does not have severe back pain. Can't straighten up when she gets up in the morning. Has a hx of L4 and L5 herniated discs. Had PT in the past for it.  For most of adult life, she would have episodes of acute back pain which would improve for 9-10 months. Now feels a constant dull ache since about 5 years ago.   Falls began about 5 years ago but has fallen more frequently in the last 6 months.  Pt is retired and volunteers a lot. But when at home pt tends to lie down and watch TV, Netflix. Pt feels out of shape and feels like she cannot go long without getting out of breath. Dreads walking her golden Chiropractorretriver mix dog.   Pt states she did aquatherapy before and did a lot of stuff in the pool but when she got out of the pool, her legs felt like rubber.     Patient Stated Goals  Pt expresses desire to not fall.  [facility] PT Assessment - 10/02/17 1715      Observation/Other Assessments   Focus on Therapeutic Outcomes (FOTO)   47    Lower Extremity Functional Scale   48            No data recorded               PT Education - 10/02/17 1554    Education provided  Yes    Education Details  ther-ex, HEP    Person(s) Educated  Patient    Methods  Explanation;Demonstration;Tactile cues;Verbal cues;Handout    Comprehension  Returned demonstration;Verbalized understanding           Objectives   FOTO visit 6/12  No latex allergies   Ther-ex  Pt was recommended to use her [facility] for safety. Pt states she might use a hiking pole.   Static standing with static cervical rotation  Slight increased LE unsteadiness with R > L rotation  Pt was recommended to use a heating pad R upper trap area 15 min x 3 daily to help decrease tension and help  increase cervical rotation ROM.   Standing bilateral scapular retraction resisting yellow band 10x3 with 5 second holds.  Reviewed and given as part of her HEP. Pt demonstrated and verbalized understanding. Handout provided.  Chin tucks 10x5 seconds for 2 sets.     Improved exercise technique, movement at target joints, use of target muscles after mod verbal, visual, tactile cues.     Manual therapy  STM R upper trap and rhomboid muscle area  Decreased unsteadiness with R and L cervical rotation afterwards   Decreased LE unsteadiness observed with static R and L cervical rotation after manual therapy to decrease tension to R rhomboid and upper trap muscle to help improve R cervical rotation. Recommend for pt to use a hot pack (with cloth barrier 15 min x 3 daily) to upper trap and added chin tucks and scapular retraction to HEP to help with motion and hopefully help with balance. Pt tolerated session well without aggravation of symptoms.       PT Long Term Goals - 09/10/17 1706      PT LONG TERM GOAL #1   Title  Patient will improve her Berg Balance test score to at least 46/56 as a demonstration of improved balance, and decreased fall risk.     Baseline  37/56 (09/10/2017)    Time  6    Period  Weeks    Status  New    Target Date  10/24/17      PT LONG TERM GOAL #2   Title  Patient will improve bilateral hip strength by at least 1/2 MMT to promote ability to ambulate, and perform standing tasks.     Time  6    Period  Weeks    Status  New    Target Date  10/24/17      PT LONG TERM GOAL #3   Title  Pt will improve her FOTO score to 53 or more as a demonstration of improved function.     Baseline  46 (09/10/2017)    Time  6    Period  Weeks    Status  New    Target Date  10/24/17      PT LONG TERM GOAL #4   Title  Patient will improve her LEFS score by at least 9 points as a demonstration of improved function.     Baseline  46/80 (09/10/2017)  Time  6    Period  Weeks     Status  New    Target Date  10/24/17            Plan - 10/02/17 1555    Clinical Impression Statement  Decreased LE unsteadiness observed with static R and L cervical rotation after manual therapy to decrease tension to R rhomboid and upper trap muscle to help improve R cervical rotation. Recommend for pt to use a hot pack (with cloth barrier 15 min x 3 daily) to upper trap and added chin tucks and scapular retraction to HEP to help with motion and hopefully help with balance. Pt tolerated session well without aggravation of symptoms.     Rehab Potential  Fair    Clinical Impairments Affecting Rehab Potential  Chronicity of condition, increased falls, deconditioning, hip weakness, back pain/discomfort, motivation level for exercise    PT Frequency  2x / week    PT Duration  6 weeks    PT Treatment/Interventions  Aquatic Therapy;Electrical Stimulation;Iontophoresis 4mg /ml Dexamethasone;Traction;Ultrasound;Gait training;Therapeutic activities;Therapeutic exercise;Balance training;Neuromuscular re-education;Patient/family education;Manual techniques;Dry needling traction if appropriate    PT Next Visit Plan  LE strengthening, increase activity tolerance.     Consulted and Agree with Plan of Care  Patient       Patient will benefit from skilled therapeutic intervention in order to improve the following deficits and impairments:  Postural dysfunction, Pain, Improper body mechanics, Difficulty walking, Decreased strength, Decreased endurance, Decreased balance, Decreased activity tolerance  Visit Diagnosis: History of falling  Unsteadiness on feet  Muscle weakness (generalized)  Difficulty in walking, not elsewhere classified     Problem List Patient Active Problem List   Diagnosis Date Noted  . Acute bronchitis 10/06/2016    Loralyn Freshwater PT, DPT   10/02/2017, 5:23 PM  [place] [facility] PHYSICAL AND SPORTS MEDICINE [address] [place], [address] Phone: [phone]   Fax:  [phone]  Name: Kara Pennington MRN: [id number] Date of Birth: 1940-08-04

## 2017-10-08 ENCOUNTER — Ambulatory Visit: Payer: Medicare Other | Attending: Geriatric Medicine

## 2017-10-08 ENCOUNTER — Other Ambulatory Visit: Payer: Self-pay

## 2017-10-08 DIAGNOSIS — R262 Difficulty in walking, not elsewhere classified: Secondary | ICD-10-CM

## 2017-10-08 DIAGNOSIS — M6281 Muscle weakness (generalized): Secondary | ICD-10-CM

## 2017-10-08 DIAGNOSIS — Z9181 History of falling: Secondary | ICD-10-CM

## 2017-10-08 DIAGNOSIS — R2681 Unsteadiness on feet: Secondary | ICD-10-CM | POA: Diagnosis present

## 2017-10-08 NOTE — Therapy (Signed)
[facility] MAIN [facility] SERVICES [address] [place], [place], [address] Phone: [phone]   Fax:  [phone]  Physical Therapy Treatment  Patient Details  Name: Kara Pennington MRN: [id number] Date of Birth: 1941-06-29 Referring Provider: Tomasita Crumble, MD   Encounter Date: 10/08/2017  PT End of Session - 10/08/17 1907    Visit Number  7    Number of Visits  13    Date for PT Re-Evaluation  10/24/17    PT Start Time  0945    PT Stop Time  1025    PT Time Calculation (min)  40 min    Activity Tolerance  Patient tolerated treatment well    Behavior During Therapy  [facility] for tasks assessed/performed       Past Medical History:  Diagnosis Date  . Anxiety   . Depression   . GERD (gastroesophageal reflux disease)   . Hypertension     Past Surgical History:  Procedure Laterality Date  . APPENDECTOMY    . PARATHYROIDECTOMY      There were no vitals filed for this visit.  Subjective Assessment - 10/08/17 1906    Subjective  Pt also reports no fear of water, but is concerned with overwork and "legs feeling like noodles" after session.       Enters/exits via ramp Participates in the following  Ambulation, blue dumbbells for support  4 L fwd  4 L side  Core stabilization with UE work, (water just over chest level/shoulders out)  Red dumbbells, 2 x 10 ea   Triceps press down   Sh abd/add   Sh flex/ext   Sh horiz abd/add  Bench, core stabilization with LE work, 2 min ea and 30 sec rest in between  [employer] down  2 min slow walk  gentle side to side mini lunge, slow 1 min                             PT Education - 10/08/17 1905    Education provided  Yes    Education Details  On properties of water and benefits to exercise. Core stabilization with exercise and in regards to posture. Conservative approach to prevent overwork/fatigue.           PT Long Term Goals - 09/10/17  1706      PT LONG TERM GOAL #1   Title  Patient will improve her Berg Balance test score to at least 46/56 as a demonstration of improved balance, and decreased fall risk.     Baseline  37/56 (09/10/2017)    Time  6    Period  Weeks    Status  New    Target Date  10/24/17      PT LONG TERM GOAL #2   Title  Patient will improve bilateral hip strength by at least 1/2 MMT to promote ability to ambulate, and perform standing tasks.     Time  6    Period  Weeks    Status  New    Target Date  10/24/17      PT LONG TERM GOAL #3   Title  Pt will improve her FOTO score to 53 or more as a demonstration of improved function.     Baseline  46 (09/10/2017)    Time  6    Period  Weeks    Status  New  Target Date  10/24/17      PT LONG TERM GOAL #4   Title  Patient will improve her LEFS score by at least 9 points as a demonstration of improved function.     Baseline  46/80 (09/10/2017)    Time  6    Period  Weeks    Status  New    Target Date  10/24/17            Plan - 10/08/17 1908    Clinical Impression Statement  Forward flexed posture, surely adding to her falls forward. Weak core. Weakened LE's. Does fatigue easily requiring rest breaks.     Rehab Potential  Fair    Clinical Impairments Affecting Rehab Potential  Chronicity of condition, increased falls, deconditioning, hip weakness, back pain/discomfort, motivation level for exercise    PT Frequency  2x / week    PT Duration  6 weeks    PT Treatment/Interventions  Aquatic Therapy;Electrical Stimulation;Iontophoresis 4mg /ml Dexamethasone;Traction;Ultrasound;Gait training;Therapeutic activities;Therapeutic exercise;Balance training;Neuromuscular re-education;Patient/family education;Manual techniques;Dry needling traction if appropriate    PT Next Visit Plan  LE strengthening, increase activity tolerance.     Consulted and Agree with Plan of Care  Patient       Patient will benefit from skilled therapeutic intervention in order  to improve the following deficits and impairments:  Postural dysfunction, Pain, Improper body mechanics, Difficulty walking, Decreased strength, Decreased endurance, Decreased balance, Decreased activity tolerance  Visit Diagnosis: History of falling  Difficulty in walking, not elsewhere classified  Unsteadiness on feet  Muscle weakness (generalized)     Problem List Patient Active Problem List   Diagnosis Date Noted  . Acute bronchitis 10/06/2016    Scot DockHeidi E Vear Staton 10/08/2017, 7:13 PM  [facility] [address] [place], [place], [address] Phone: [phone]   Fax:  [phone]  Name: Kara Pennington MRN: [id number] Date of Birth: [birth year]

## 2017-10-10 ENCOUNTER — Ambulatory Visit: Payer: Medicare Other

## 2017-10-15 ENCOUNTER — Other Ambulatory Visit: Payer: Self-pay

## 2017-10-15 ENCOUNTER — Ambulatory Visit: Payer: Medicare Other

## 2017-10-15 DIAGNOSIS — Z9181 History of falling: Secondary | ICD-10-CM | POA: Diagnosis not present

## 2017-10-15 DIAGNOSIS — R262 Difficulty in walking, not elsewhere classified: Secondary | ICD-10-CM

## 2017-10-15 DIAGNOSIS — R2681 Unsteadiness on feet: Secondary | ICD-10-CM

## 2017-10-15 DIAGNOSIS — M6281 Muscle weakness (generalized): Secondary | ICD-10-CM

## 2017-10-15 NOTE — Therapy (Signed)
[facility] MAIN [facility] SERVICES [address] [place], [place], [address] Phone: [phone]   Fax:  [phone]  Physical Therapy Treatment  Patient Details  Name: Kara Pennington MRN: [id number] Date of Birth: June 28, 1941 Referring Provider: Tomasita Crumble, MD   Encounter Date: 10/15/2017  PT End of Session - 10/15/17 1401    Visit Number  8    Number of Visits  13    Date for PT Re-Evaluation  10/24/17    PT Start Time  0955    PT Stop Time  1040    PT Time Calculation (min)  45 min    Activity Tolerance  Patient tolerated treatment well    Behavior During Therapy  [facility] for tasks assessed/performed       Past Medical History:  Diagnosis Date  . Anxiety   . Depression   . GERD (gastroesophageal reflux disease)   . Hypertension     Past Surgical History:  Procedure Laterality Date  . APPENDECTOMY    . PARATHYROIDECTOMY      There were no vitals filed for this visit.  Subjective Assessment - 10/15/17 1359    Subjective  Pt reports tolerating initial pool session well without over tired LEs or increased pain. Pt eager to continue with aquatic sessions noting she can tolerate activity/exercise much better in the water.     Pertinent History  Balance. Pt had lots of falls. Has not had one in the last 2 months. Prior to that, pt had a fall coming out of church, or when just standing. Does not know why. Pt just feels sore and bruised, has not broken any bones during her recent falls. 5 years ago, pt fell and had a concussion. Other than that, she just has bruising. Currently afraid of falling and breaking a hip. No dizziness or lightheadedness. Everything is fine but the next thing she knows, she is on the floor or sidewalk. Pt states that she does not have any stamina or energy. Cannot walk real far without getting short of breath.  Does not have severe back pain. Can't straighten up when she gets up in the morning. Has a hx of L4 and L5  herniated discs. Had PT in the past for it.  For most of adult life, she would have episodes of acute back pain which would improve for 9-10 months. Now feels a constant dull ache since about 5 years ago.   Falls began about 5 years ago but has fallen more frequently in the last 6 months.  Pt is retired and volunteers a lot. But when at home pt tends to lie down and watch TV, Netflix. Pt feels out of shape and feels like she cannot go long without getting out of breath. Dreads walking her golden Chiropractor.   Pt states she did aquatherapy before and did a lot of stuff in the pool but when she got out of the pool, her legs felt like rubber.       Enters/exits pool via ramp Participates in the following  Ambulation, blue dumbbells for support  Fwd 4 L  side 4 L    Core stabilization with LE work, 20x ea  Hip abd/add  Hip flex/ext  Core stabilization with UE work, 2 x 10 ea (sh's just out of water)  Red dumbbells   Triceps press down   Sh abd/add   Sh flex/ext   Sh horiz abd/add  Bench LE/core, 3 min each ( adjust  speed as needed, no stopping)  Advertising copywriterBike  Scissor with DF  Flutter with PF  Stretching at step, 3 x 10 sec each  Hamstrings  Hip flexors                         PT Education - 10/15/17 1400    Education provided  Yes    Education Details  core with LE strengthening    Person(s) Educated  Patient    Methods  Explanation;Demonstration;Tactile cues;Verbal cues    Comprehension  Verbalized understanding;Returned demonstration;Verbal cues required;Tactile cues required          PT Long Term Goals - 09/10/17 1706      PT LONG TERM GOAL #1   Title  Patient will improve her Berg Balance test score to at least 46/56 as a demonstration of improved balance, and decreased fall risk.     Baseline  37/56 (09/10/2017)    Time  6    Period  Weeks    Status  New    Target Date  10/24/17      PT LONG TERM GOAL #2   Title  Patient will improve bilateral  hip strength by at least 1/2 MMT to promote ability to ambulate, and perform standing tasks.     Time  6    Period  Weeks    Status  New    Target Date  10/24/17      PT LONG TERM GOAL #3   Title  Pt will improve her FOTO score to 53 or more as a demonstration of improved function.     Baseline  46 (09/10/2017)    Time  6    Period  Weeks    Status  New    Target Date  10/24/17      PT LONG TERM GOAL #4   Title  Patient will improve her LEFS score by at least 9 points as a demonstration of improved function.     Baseline  46/80 (09/10/2017)    Time  6    Period  Weeks    Status  New    Target Date  10/24/17            Plan - 10/15/17 1403    Clinical Impression Statement  Pt tolerates session well with brief rest periods between exercises. Pt notes increased awareness of core/ability to stabilize core in water. RLE weakness noted particularly in R closed chain position with open chain work on L requiring verbal and tactile cues to avoid Trendelenburg on R.     Rehab Potential  Fair    Clinical Impairments Affecting Rehab Potential  Chronicity of condition, increased falls, deconditioning, hip weakness, back pain/discomfort, motivation level for exercise    PT Frequency  2x / week    PT Duration  6 weeks    PT Treatment/Interventions  Aquatic Therapy;Electrical Stimulation;Iontophoresis 4mg /ml Dexamethasone;Traction;Ultrasound;Gait training;Therapeutic activities;Therapeutic exercise;Balance training;Neuromuscular re-education;Patient/family education;Manual techniques;Dry needling traction if appropriate    PT Next Visit Plan  LE strengthening, increase activity tolerance.     Consulted and Agree with Plan of Care  Patient       Patient will benefit from skilled therapeutic intervention in order to improve the following deficits and impairments:  Postural dysfunction, Pain, Improper body mechanics, Difficulty walking, Decreased strength, Decreased endurance, Decreased balance,  Decreased activity tolerance  Visit Diagnosis: Difficulty in walking, not elsewhere classified  History of falling  Unsteadiness on feet  Muscle weakness (  generalized)     Problem List Patient Active Problem List   Diagnosis Date Noted  . Acute bronchitis 10/06/2016    Scot Dock 10/15/2017, 2:06 PM  [facility] MAIN [facility] SERVICES [address] [place], [place], [address] Phone: [phone]   Fax:  [phone]  Name: Kara Pennington MRN: [id number] Date of Birth: [birth year]

## 2017-10-17 ENCOUNTER — Ambulatory Visit: Payer: Medicare Other

## 2017-10-17 ENCOUNTER — Other Ambulatory Visit: Payer: Self-pay

## 2017-10-17 DIAGNOSIS — R262 Difficulty in walking, not elsewhere classified: Secondary | ICD-10-CM

## 2017-10-17 DIAGNOSIS — M6281 Muscle weakness (generalized): Secondary | ICD-10-CM

## 2017-10-17 DIAGNOSIS — Z9181 History of falling: Secondary | ICD-10-CM

## 2017-10-17 DIAGNOSIS — R2681 Unsteadiness on feet: Secondary | ICD-10-CM

## 2017-10-17 NOTE — Therapy (Signed)
[facility] MAIN [facility] [address] [place], [place], [address] Phone: [phone]   Fax:  [phone]  Physical Therapy Treatment  Patient Details  Name: Kara PilarMyra S Hershberger MRN: [id number] Date of Birth: Sep 11, 1940 Referring Provider: Tomasita CrumbleSusanna Thach, MD   Encounter Date: 10/17/2017  PT End of Session - 10/17/17 1631    Visit Number  9    Number of Visits  13    Date for PT Re-Evaluation  10/24/17    PT Start Time  1030    PT Stop Time  1130    PT Time Calculation (min)  60 min    Activity Tolerance  Patient tolerated treatment well    Behavior During Therapy  [facility] for tasks assessed/performed       Past Medical History:  Diagnosis Date  . Anxiety   . Depression   . GERD (gastroesophageal reflux disease)   . Hypertension     Past Surgical History:  Procedure Laterality Date  . APPENDECTOMY    . PARATHYROIDECTOMY      There were no vitals filed for this visit.  Subjective Assessment - 10/17/17 1629    Subjective  Pt reports tolerating last aquatic session with some mild fatigue, but LEs are feeling "good" overall. Notes no falls or near falls.     Pertinent History  Balance. Pt had lots of falls. Has not had one in the last 2 months. Prior to that, pt had a fall coming out of church, or when just standing. Does not know why. Pt just feels sore and bruised, has not broken any bones during her recent falls. 5 years ago, pt fell and had a concussion. Other than that, she just has bruising. Currently afraid of falling and breaking a hip. No dizziness or lightheadedness. Everything is fine but the next thing she knows, she is on the floor or sidewalk. Pt states that she does not have any stamina or energy. Cannot walk real far without getting short of breath.  Does not have severe back pain. Can't straighten up when she gets up in the morning. Has a hx of L4 and L5 herniated discs. Had PT in the past for it.  For most of adult life, she  would have episodes of acute back pain which would improve for 9-10 months. Now feels a constant dull ache since about 5 years ago.   Falls began about 5 years ago but has fallen more frequently in the last 6 months.  Pt is retired and volunteers a lot. But when at home pt tends to lie down and watch TV, Netflix. Pt feels out of shape and feels like she cannot go long without getting out of breath. Dreads walking her golden Chiropractorretriver mix dog.   Pt states she did aquatherapy before and did a lot of stuff in the pool but when she got out of the pool, her legs felt like rubber.       Ambulation, blue dumbbells for postural support  Fwd, 4 L  side, 4 L  Suspended position, 1 min each  jack  CC ski  X jack  jog  Core with UE work using mitts, 30x ea  sh horiz abd/add  sh abd/add  sh flex/ext Red dumbbells  Triceps press down  Suspended position repeated as above  Core with LE at rail  hip abd/add, B 20x ea  hip flex/ext, B 20x ea  Stretching at step, B 3 x 15 sec each  hamstrings  hip flexors                         PT Education - 10/17/17 1630    Education provided  Yes    Education Details  suspended position LE/cardio work. Core with UE work using Dispensing optician) Educated  Patient          PT Long Term Goals - 09/10/17 1706      PT LONG TERM GOAL #1   Title  Patient will improve her Berg Balance test score to at least 46/56 as a demonstration of improved balance, and decreased fall risk.     Baseline  37/56 (09/10/2017)    Time  6    Period  Weeks    Status  New    Target Date  10/24/17      PT LONG TERM GOAL #2   Title  Patient will improve bilateral hip strength by at least 1/2 MMT to promote ability to ambulate, and perform standing tasks.     Time  6    Period  Weeks    Status  New    Target Date  10/24/17      PT LONG TERM GOAL #3   Title  Pt will improve her FOTO score to 53 or more as a demonstration of improved function.      Baseline  46 (09/10/2017)    Time  6    Period  Weeks    Status  New    Target Date  10/24/17      PT LONG TERM GOAL #4   Title  Patient will improve her LEFS score by at least 9 points as a demonstration of improved function.     Baseline  46/80 (09/10/2017)    Time  6    Period  Weeks    Status  New    Target Date  10/24/17            Plan - 10/17/17 1632    Clinical Impression Statement  Pt tolerated session well and enjoyed suspended position work with excellent performance and understanding. Plan to continue program with circuits of strength and cardio/strength work for improved overall LE/core strength and endurance.     Rehab Potential  Fair    Clinical Impairments Affecting Rehab Potential  Chronicity of condition, increased falls, deconditioning, hip weakness, back pain/discomfort, motivation level for exercise    PT Frequency  2x / week    PT Duration  6 weeks    PT Treatment/Interventions  Aquatic Therapy;Electrical Stimulation;Iontophoresis 4mg /ml Dexamethasone;Traction;Ultrasound;Gait training;Therapeutic activities;Therapeutic exercise;Balance training;Neuromuscular re-education;Patient/family education;Manual techniques;Dry needling traction if appropriate    PT Next Visit Plan  LE strengthening, increase activity tolerance.     Consulted and Agree with Plan of Care  Patient       Patient will benefit from skilled therapeutic intervention in order to improve the following deficits and impairments:  Postural dysfunction, Pain, Improper body mechanics, Difficulty walking, Decreased strength, Decreased endurance, Decreased balance, Decreased activity tolerance  Visit Diagnosis: Difficulty in walking, not elsewhere classified  History of falling  Unsteadiness on feet  Muscle weakness (generalized)     Problem List Patient Active Problem List   Diagnosis Date Noted  . Acute bronchitis 10/06/2016    Scot Dock 10/17/2017, 4:34 PM  [facility] MAIN [facility] SERVICES [address] [place], [place], [address] Phone: [phone]   Fax:  [phone]  Name: IISHA SOYARS MRN: [id number] Date of Birth: Apr 26, 1941

## 2017-10-21 ENCOUNTER — Ambulatory Visit: Payer: Medicare Other

## 2017-10-21 DIAGNOSIS — Z9181 History of falling: Secondary | ICD-10-CM

## 2017-10-21 DIAGNOSIS — R262 Difficulty in walking, not elsewhere classified: Secondary | ICD-10-CM

## 2017-10-21 DIAGNOSIS — M6281 Muscle weakness (generalized): Secondary | ICD-10-CM

## 2017-10-21 DIAGNOSIS — R2681 Unsteadiness on feet: Secondary | ICD-10-CM

## 2017-10-21 NOTE — Therapy (Signed)
[facility] [address], [place], [address] Phone: [phone]   Fax:  [phone]  Physical Therapy Treatment And Progress Report  Patient Details  Name: Kara Pennington MRN: [id number] Date of Birth: 1941/02/25 Referring Provider: Cora Daniels, MD   Encounter Date: 10/21/2017  PT End of Session - 10/21/17 1350    Visit Number  10    Number of Visits  25    Date for PT Re-Evaluation  12/05/17    PT Start Time  1350    PT Stop Time  1431    PT Time Calculation (min)  41 min    Activity Tolerance  Patient tolerated treatment well    Behavior During Therapy  [facility] for tasks assessed/performed       Past Medical History:  Diagnosis Date  . Anxiety   . Depression   . GERD (gastroesophageal reflux disease)   . Hypertension     Past Surgical History:  Procedure Laterality Date  . APPENDECTOMY    . PARATHYROIDECTOMY      There were no vitals filed for this visit.  Subjective Assessment - 10/21/17 1351    Subjective  Has been getting a workout at aquatherapy. Had a fall 1x on 10/05/2017. Her dog bolted while walking her dog and pt fell onto the ground, landing onto her L knee and R wrist. Pressing her L knee feels sore but does not look like there is anything wrong with it.    Has not gotten a hiking pole yet.  Feels like she needs more PT. Feels like the aquatherapy is very helpful.     Pertinent History  Balance. Pt had lots of falls. Has not had one in the last 2 months. Prior to that, pt had a fall coming out of church, or when just standing. Does not know why. Pt just feels sore and bruised, has not broken any bones during her recent falls. 5 years ago, pt fell and had a concussion. Other than that, she just has bruising. Currently afraid of falling and breaking a hip. No dizziness or lightheadedness. Everything is fine but the next thing she knows, she is on the floor or sidewalk. Pt states that she does  not have any stamina or energy. Cannot walk real far without getting short of breath.  Does not have severe back pain. Can't straighten up when she gets up in the morning. Has a hx of L4 and L5 herniated discs. Had PT in the past for it.  For most of adult life, she would have episodes of acute back pain which would improve for 9-10 months. Now feels a constant dull ache since about 5 years ago.   Falls began about 5 years ago but has fallen more frequently in the last 6 months.  Pt is retired and volunteers a lot. But when at home pt tends to lie down and watch TV, Netflix. Pt feels out of shape and feels like she cannot go long without getting out of breath. 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   Pt states she did aquatherapy before and did a lot of stuff in the pool but when she got out of the pool, her legs felt like rubber.     Currently in Pain?  Other (Comment)    Pain Score  -- no complain of pain unless pressing on her L knee joint          [facility] PT Assessment -  10/21/17 1403      Observation/Other Assessments   Focus on Therapeutic Outcomes (FOTO)   44 measured 10/02/2017    Lower Extremity Functional Scale   48 measured 10/02/2017      Strength   Right Hip Flexion  4/5    Right Hip Extension  4-/5 seated manually resisted leg press    Right Hip ABduction  4-/5    Left Hip Flexion  4-/5    Left Hip Extension  4/5 seated manually resisted leg press    Left Hip ABduction  4/5      Berg Balance Test   Sit to Stand  Able to stand without using hands and stabilize independently    Standing Unsupported  Able to stand safely 2 minutes    Sitting with Back Unsupported but Feet Supported on Floor or Stool  Able to sit safely and securely 2 minutes    Stand to Sit  Sits safely with minimal use of hands    Transfers  Able to transfer safely, minor use of hands    Standing Unsupported with Eyes Closed  Able to stand 10 seconds with supervision slight increased unsteadiness    Standing  Ubsupported with Feet Together  Able to place feet together independently and stand for 1 minute with supervision    From Standing, Reach Forward with Outstretched Arm  Can reach forward >12 cm safely (5")    From Standing Position, Pick up Object from Floor  Able to pick up shoe safely and easily    From Standing Position, Turn to Look Behind Over each Shoulder  Needs supervision when turning unsteady at end range trunk rotation bilaterally    Turn 360 Degrees  Needs close supervision or verbal cueing antalgic R LE when turning to the L    Standing Unsupported, Alternately Place Feet on Step/Stool  Needs assistance to keep from falling or unable to try LOB when on R LE    Standing Unsupported, One Foot in Front  Able to plae foot ahead of the other independently and hold 30 seconds    Standing on One Leg  Able to lift leg independently and hold equal to or more than 3 seconds    Total Score  40    Berg comment:  < 46/56 suggests increased need for AD and increased fall risk.                             PT Education - 10/21/17 1807    Education provided  Yes    Education Details  ther-ex, plan of care    Person(s) Educated  Patient    Methods  Explanation;Demonstration;Tactile cues;Verbal cues    Comprehension  Returned demonstration;Verbalized understanding         Objectives   FOTO visit 10/12  No latex allergies  Next MD visit is May 9 or 14  Ther-ex  Pt was recommended to get her blood vessels of her neck checked due to increased LE wobbliness with cervical rotation based of previous sessions. Pt verbalized understanding.   Reviewed plan of care: 2x/week for 6 weeks  Directed patient with sit <> stand throughout session Stand pivot transfer chair <> low mat table Static standing shoulder width apart, then with eyes closed, then with eyes open feet together, tandem stance with feet shoulder width apart Picking up an object (computer mouse)  from the floor,  Turning 360 degrees to the R and to  the L 1x Looking behind to the R and to the L,  Standing forward reach,   Standing alternate toe taps 4 x each LE onto first regular step  Difficulty with weight bearing on R LE at times with aforementioned balance activities such as 360 degrees turn to the L and alteranating toe taps onto stair step.   Seated manually resisted hip flexion, leg press, S/L hip abduction 1-2x each way for each LE  Reviewed progress/current status with hip strength with pt.    Standing R cervical rotation. More stable compared to previous sessions  Standing L cervical rotation. More stable compared to previous sessions  Continue working on throracic mobility to promote ability to turn neck and trunk/low back with improved LE stability.   Improved exercise technique, movement at target joints, use of target muscles after min to mod verbal, visual, tactile cues.    Pt demonstrates improved bilateral glute med muscle strength as well as overall improved BERG balance test suggesting decreased fall risk since initial evaluation. Pt is also better able to maintain R and L cervical rotation position with decreased bilateral LE unsteadiness compared to previous sessions. Pt was recommended to have her neck blood vessels checked at her next doctor visit secondary to unsteadiness with static cervical rotation position to make sure they are ok. Pt verbalized understanding. She still demonstrates bilateral hip weakness, decreased balance, decreased endurance and difficulty performing functional tasks and would benefit from continued skilled physical therapy services to address the aforementioned deficits.       PT Long Term Goals - 10/21/17 1810      PT LONG TERM GOAL #1   Title  Patient will improve her Berg Balance test score to at least 46/56 as a demonstration of improved balance, and decreased fall risk.     Baseline  37/56 (09/10/2017); 40/56 (10/21/2017)     Time  6    Period  Weeks    Status  Partially Met    Target Date  12/05/17      PT LONG TERM GOAL #2   Title  Patient will improve bilateral hip strength by at least 1/2 MMT to promote ability to ambulate, and perform standing tasks.     Time  6    Period  Weeks    Status  Partially Met    Target Date  12/05/17      PT LONG TERM GOAL #3   Title  Pt will improve her FOTO score to 53 or more as a demonstration of improved function.     Baseline  46 (09/10/2017); 47 (10/02/2017)    Time  6    Period  Weeks    Status  On-going    Target Date  12/05/17      PT LONG TERM GOAL #4   Title  Patient will improve her LEFS score by at least 9 points as a demonstration of improved function.     Baseline  46/80 (09/10/2017); 48/80 (10/02/2017)    Time  6    Period  Weeks    Status  --    Target Date  12/05/17            Plan - 10/21/17 1350    Clinical Impression Statement  Pt demonstrates improved bilateral glute med muscle strength as well as overall improved BERG balance test suggesting decreased fall risk since initial evaluation. Pt is also better able to maintain R and L cervical rotation position with decreased bilateral LE unsteadiness  compared to previous sessions. Pt was recommended to have her neck blood vessels checked at her next doctor visit secondary to unsteadiness with static cervical rotation position to make sure they are ok. Pt verbalized understanding. She still demonstrates bilateral hip weakness, decreased balance, decreased endurance and difficulty performing functional tasks and would benefit from continued skilled physical therapy services to address the aforementioned deficits.     History and Personal Factors relevant to plan of care:  Chronicity of condition, hx of falls, deconditioning, hip weakness, back pain/discomfort    Clinical Presentation  Stable    Clinical Presentation due to:  improved Berg balance score    Clinical Decision Making  Low    Rehab Potential   Fair    Clinical Impairments Affecting Rehab Potential  Chronicity of condition, increased falls, deconditioning, hip weakness, back pain/discomfort, motivation level for exercise    PT Frequency  2x / week    PT Duration  6 weeks    PT Treatment/Interventions  Aquatic Therapy;Electrical Stimulation;Iontophoresis 17m/ml Dexamethasone;Traction;Ultrasound;Gait training;Therapeutic activities;Therapeutic exercise;Balance training;Neuromuscular re-education;Patient/family education;Manual techniques;Dry needling traction if appropriate    PT Next Visit Plan  LE strengthening, increase activity tolerance.     Consulted and Agree with Plan of Care  Patient       Patient will benefit from skilled therapeutic intervention in order to improve the following deficits and impairments:  Postural dysfunction, Pain, Improper body mechanics, Difficulty walking, Decreased strength, Decreased endurance, Decreased balance, Decreased activity tolerance  Visit Diagnosis: History of falling - Plan: PT plan of care cert/re-cert  Difficulty in walking, not elsewhere classified - Plan: PT plan of care cert/re-cert  Unsteadiness on feet - Plan: PT plan of care cert/re-cert  Muscle weakness (generalized) - Plan: PT plan of care cert/re-cert     Problem List Patient Active Problem List   Diagnosis Date Noted  . Acute bronchitis 10/06/2016   Thank you for your referral.   MJoneen BoersPT, DPT   10/21/2017, 6:54 PM  [facility] [address] [place] [address]Phone: [phone]  Fax:  [phone] Name: Kara HALBERGMRN: [id number] of Birth: [birth year]

## 2017-10-24 ENCOUNTER — Ambulatory Visit: Payer: Medicare Other

## 2017-10-24 DIAGNOSIS — R2681 Unsteadiness on feet: Secondary | ICD-10-CM

## 2017-10-24 DIAGNOSIS — M6281 Muscle weakness (generalized): Secondary | ICD-10-CM

## 2017-10-24 DIAGNOSIS — Z9181 History of falling: Secondary | ICD-10-CM | POA: Diagnosis not present

## 2017-10-24 DIAGNOSIS — R262 Difficulty in walking, not elsewhere classified: Secondary | ICD-10-CM

## 2017-10-28 ENCOUNTER — Ambulatory Visit: Payer: Medicare Other

## 2017-10-28 ENCOUNTER — Other Ambulatory Visit: Payer: Self-pay

## 2017-10-28 DIAGNOSIS — R262 Difficulty in walking, not elsewhere classified: Secondary | ICD-10-CM

## 2017-10-28 DIAGNOSIS — R2681 Unsteadiness on feet: Secondary | ICD-10-CM

## 2017-10-28 DIAGNOSIS — Z9181 History of falling: Secondary | ICD-10-CM

## 2017-10-28 DIAGNOSIS — M6281 Muscle weakness (generalized): Secondary | ICD-10-CM

## 2017-10-28 NOTE — Therapy (Signed)
[place] [facility] [address], [place], [address] Phone: [phone]   Fax:  [phone]  Physical Therapy Treatment  Patient Details  Name: Kara Pennington MRN: [id number] Date of Birth: [birth year] Referring Provider: Cora Daniels, MD   Encounter Date: 10/28/2017  PT End of Session - 10/28/17 1520    Visit Number  12    Number of Visits  25    Date for PT Re-Evaluation  12/05/17    PT Start Time  1520    PT Stop Time  1604    PT Time Calculation (min)  44 min    Activity Tolerance  Patient tolerated treatment well    Behavior During Therapy  [facility] for tasks assessed/performed       Past Medical History:  Diagnosis Date  . Anxiety   . Depression   . GERD (gastroesophageal reflux disease)   . Hypertension     Past Surgical History:  Procedure Laterality Date  . APPENDECTOMY    . PARATHYROIDECTOMY      There were no vitals filed for this visit.  Subjective Assessment - 10/28/17 1522    Subjective  Has not had any near falls in the past few days. Back bothered her the rest of the day after her pool session. Pain eased off at the end of the day.  No back pain or ache currently.     Pertinent History  Balance. Pt had lots of falls. Has not had one in the last 2 months. Prior to that, pt had a fall coming out of church, or when just standing. Does not know why. Pt just feels sore and bruised, has not broken any bones during her recent falls. 5 years ago, pt fell and had a concussion. Other than that, she just has bruising. Currently afraid of falling and breaking a hip. No dizziness or lightheadedness. Everything is fine but the next thing she knows, she is on the floor or sidewalk. Pt states that she does not have any stamina or energy. Cannot walk real far without getting short of breath.  Does not have severe back pain. Can't straighten up when she gets up in the morning. Has a hx of L4 and L5 herniated discs.  Had PT in the past for it.  For most of adult life, she would have episodes of acute back pain which would improve for 9-10 months. Now feels a constant dull ache since about 5 years ago.   Falls began about 5 years ago but has fallen more frequently in the last 6 months.  Pt is retired and volunteers a lot. But when at home pt tends to lie down and watch TV, Netflix. Pt feels out of shape and feels like she cannot go long without getting out of breath. 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   Pt states she did aquatherapy before and did a lot of stuff in the pool but when she got out of the pool, her legs felt like rubber.     Currently in Pain?  No/denies                               Objectives   FOTO visit 12/12  No latex allergies  Perform Recert on 7/82/4235 appt   Ther-ex  Seated  chin tucks 10x5 seconds for 2 sets.   OMEGA rows plate 10 for 6x. Difficult  Plate  5 for 10x2  Side stepping 20 ft to the R and 20 ft to the L for 2 sets to promote glute med muscle use  Forward wedding march 20 ft x 4 to promote glute muscle use  Standing low rows resisting red band  10 times then 8x5 seconds  Increased trunk and LE "wobbliness" observed.   Standing L shoulder extension resisting red band 10x  Seated trunk flexion 10x10 seconds to decrease low back pressure.   Standing glute max squeeze 10 seconds x 6  Seated physioball flexion 10 seconds x 2. Pt states feeling lightheaded with the exercise. Eases with rest  Seated anterior and posterior pelvic tilt 10x2  Decreased wobbliness sensation in standing  Improved exercise technique, movement at target joints, use of target muscles after min to mod verbal, visual, tactile cues.       Manual therapy   STM L upper trap and rhomboid muscle area secondary to more tightness compared to R  Slight decreased "wobbliness" in standing    Decreased wobbliness in standing observed with  exercises to decrease pressure to low back today such as with gentle lumbar flexion and sitting position.                   PT Education - 10/28/17 1525    Education provided  Yes    Education Details  ther-ex    [employer]) Educated  Patient    Methods  Explanation;Demonstration;Tactile cues;Verbal cues    Comprehension  Returned demonstration;Verbalized understanding          PT Long Term Goals - 10/21/17 1810      PT LONG TERM GOAL #1   Title  Patient will improve her Berg Balance test score to at least 46/56 as a demonstration of improved balance, and decreased fall risk.     Baseline  37/56 (09/10/2017); 40/56 (10/21/2017)    Time  6    Period  Weeks    Status  Partially Met    Target Date  12/05/17      PT LONG TERM GOAL #2   Title  Patient will improve bilateral hip strength by at least 1/2 MMT to promote ability to ambulate, and perform standing tasks.     Time  6    Period  Weeks    Status  Partially Met    Target Date  12/05/17      PT LONG TERM GOAL #3   Title  Pt will improve her FOTO score to 53 or more as a demonstration of improved function.     Baseline  46 (09/10/2017); 47 (10/02/2017)    Time  6    Period  Weeks    Status  On-going    Target Date  12/05/17      PT LONG TERM GOAL #4   Title  Patient will improve her LEFS score by at least 9 points as a demonstration of improved function.     Baseline  46/80 (09/10/2017); 48/80 (10/02/2017)    Time  6    Period  Weeks    Status  --    Target Date  12/05/17            Plan - 10/28/17 1526    Clinical Impression Statement  Decreased wobbliness in standing observed with exercises to decrease pressure to low back today such as with gentle lumbar flexion and sitting position.     Rehab Potential  Fair    Clinical Impairments Affecting Rehab  Potential  Chronicity of condition, increased falls, deconditioning, hip weakness, back pain/discomfort, motivation level for exercise    PT Frequency  2x / week     PT Duration  6 weeks    PT Treatment/Interventions  Aquatic Therapy;Electrical Stimulation;Iontophoresis 20m/ml Dexamethasone;Traction;Ultrasound;Gait training;Therapeutic activities;Therapeutic exercise;Balance training;Neuromuscular re-education;Patient/family education;Manual techniques;Dry needling traction if appropriate    PT Next Visit Plan  LE strengthening, increase activity tolerance.     Consulted and Agree with Plan of Care  Patient       Patient will benefit from skilled therapeutic intervention in order to improve the following deficits and impairments:  Postural dysfunction, Pain, Improper body mechanics, Difficulty walking, Decreased strength, Decreased endurance, Decreased balance, Decreased activity tolerance  Visit Diagnosis: Difficulty in walking, not elsewhere classified  History of falling  Unsteadiness on feet  Muscle weakness (generalized)     Problem List Patient Active Problem List   Diagnosis Date Noted  . Acute bronchitis 10/06/2016    MJoneen BoersPT, DPT   10/28/2017, 4:21 PM  [place] [facility] [address] [place] [address]Phone: [phone]  Fax:  [phone] Name: Kara MCQUADEMRN: [id number] of Birth: 712-25-1942

## 2017-10-28 NOTE — Patient Instructions (Signed)
Seated pelvic tilts  Sitting on a chair   Sit up straight,   Then slouch down   Repeat 10 times   Perform 2-3 sets daily.

## 2017-10-28 NOTE — Therapy (Signed)
[place] MAIN [facility] SERVICES [address] [place], [place], [address] Phone: [phone]   Fax:  [phone]  Physical Therapy Treatment  Patient Details  Name: Kara Pennington MRN: [id number] Date of Birth: 1941-04-01 Referring Provider: Cora Daniels, MD   Encounter Date: 10/24/2017  PT End of Session - 10/28/17 1346    Visit Number  11    Number of Visits  25    Date for PT Re-Evaluation  12/05/17    PT Start Time  1000    PT Stop Time  1055    PT Time Calculation (min)  55 min    Activity Tolerance  Patient tolerated treatment well    Behavior During Therapy  [facility] for tasks assessed/performed       Past Medical History:  Diagnosis Date  . Anxiety   . Depression   . GERD (gastroesophageal reflux disease)   . Hypertension     Past Surgical History:  Procedure Laterality Date  . APPENDECTOMY    . PARATHYROIDECTOMY      There were no vitals filed for this visit.  Subjective Assessment - 10/28/17 1341    Subjective  No voiced compaints today; denies recent falls since last session. Pt reports feeling mildly tired initially post last session, but then energized after short rest. Pt pleased.     Pertinent History  Balance. Pt had lots of falls. Has not had one in the last 2 months. Prior to that, pt had a fall coming out of church, or when just standing. Does not know why. Pt just feels sore and bruised, has not broken any bones during her recent falls. 5 years ago, pt fell and had a concussion. Other than that, she just has bruising. Currently afraid of falling and breaking a hip. No dizziness or lightheadedness. Everything is fine but the next thing she knows, she is on the floor or sidewalk. Pt states that she does not have any stamina or energy. Cannot walk real far without getting short of breath.  Does not have severe back pain. Can't straighten up when she gets up in the morning. Has a hx of L4 and L5 herniated discs. Had PT in the  past for it.  For most of adult life, she would have episodes of acute back pain which would improve for 9-10 months. Now feels a constant dull ache since about 5 years ago.   Falls began about 5 years ago but has fallen more frequently in the last 6 months.  Pt is retired and volunteers a lot. But when at home pt tends to lie down and watch TV, Netflix. Pt feels out of shape and feels like she cannot go long without getting out of breath. 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   Pt states she did aquatherapy before and did a lot of stuff in the pool but when she got out of the pool, her legs felt like rubber.       Enters/exits pool via ramp Participates in the following  Ambulation  4 L fwd  4 L side   Suspended position work 1 min ea, no rest between (decrease arm mvt then slow mvt if needed versus complete rest)  Jog  Barnabas Lister  Ski   X jack  Modified plank series at rail, 3 x 20 sec eac  Fwd   Side, B  4 min suspended work   Core with UE strengthening, Red dumbbells, 2 x 10 ea, sh's just  out of water  Triceps press downs  Sh abd/add  Sh flex/ext  Sh horizontal abd/add  4 min suspended work  Astronomer core with LE work, 2 x 10 ea  SKTC, B  Straight leg hip ER circles  Stretching at step, B 3 x 15 sec  ham  hip flexor                         PT Education - 10/28/17 1345    Education provided  Yes    Education Details  continued suspended work, Modified plank series, Reynolds and hip ER circles with core stabilization          PT Long Term Goals - 10/21/17 1810      PT LONG TERM GOAL #1   Title  Patient will improve her Berg Balance test score to at least 46/56 as a demonstration of improved balance, and decreased fall risk.     Baseline  37/56 (09/10/2017); 40/56 (10/21/2017)    Time  6    Period  Weeks    Status  Partially Met    Target Date  12/05/17      PT LONG TERM GOAL #2   Title  Patient will improve bilateral hip strength by at least  1/2 MMT to promote ability to ambulate, and perform standing tasks.     Time  6    Period  Weeks    Status  Partially Met    Target Date  12/05/17      PT LONG TERM GOAL #3   Title  Pt will improve her FOTO score to 53 or more as a demonstration of improved function.     Baseline  46 (09/10/2017); 47 (10/02/2017)    Time  6    Period  Weeks    Status  On-going    Target Date  12/05/17      PT LONG TERM GOAL #4   Title  Patient will improve her LEFS score by at least 9 points as a demonstration of improved function.     Baseline  46/80 (09/10/2017); 48/80 (10/02/2017)    Time  6    Period  Weeks    Status  --    Target Date  12/05/17              Patient will benefit from skilled therapeutic intervention in order to improve the following deficits and impairments:  Postural dysfunction, Pain, Improper body mechanics, Difficulty walking, Decreased strength, Decreased endurance, Decreased balance, Decreased activity tolerance  Visit Diagnosis: Difficulty in walking, not elsewhere classified  History of falling  Unsteadiness on feet  Muscle weakness (generalized)     Problem List Patient Active Problem List   Diagnosis Date Noted  . Acute bronchitis 10/06/2016    Larae Grooms 10/28/2017, 1:49 PM  [facility] MAIN [facility] SERVICES [address] [place], [place], [address] Phone: [phone]   Fax:  [phone]  Name: Kara Pennington MRN: [id number] Date of Birth: 09-19-1940

## 2017-10-29 ENCOUNTER — Ambulatory Visit: Payer: Medicare Other

## 2017-10-29 ENCOUNTER — Other Ambulatory Visit: Payer: Self-pay

## 2017-10-29 DIAGNOSIS — R2681 Unsteadiness on feet: Secondary | ICD-10-CM

## 2017-10-29 DIAGNOSIS — R262 Difficulty in walking, not elsewhere classified: Secondary | ICD-10-CM

## 2017-10-29 DIAGNOSIS — Z9181 History of falling: Secondary | ICD-10-CM | POA: Diagnosis not present

## 2017-10-29 DIAGNOSIS — M6281 Muscle weakness (generalized): Secondary | ICD-10-CM

## 2017-10-29 NOTE — Therapy (Signed)
[place] MAIN [facility] SERVICES [address] [place], [place], [address] Phone: [phone]   Fax:  [phone]  Physical Therapy Treatment  Patient Details  Name: Kara Pennington MRN: [id number] Date of Birth: 11/08/1940 Referring Provider: Cora Daniels, MD   Encounter Date: 10/29/2017  PT End of Session - 10/29/17 1520    Visit Number  13    Number of Visits  25    Date for PT Re-Evaluation  12/05/17    PT Start Time  1030    PT Stop Time  1115    PT Time Calculation (min)  45 min    Activity Tolerance  Patient tolerated treatment well    Behavior During Therapy  [facility] for tasks assessed/performed       Past Medical History:  Diagnosis Date  . Anxiety   . Depression   . GERD (gastroesophageal reflux disease)   . Hypertension     Past Surgical History:  Procedure Laterality Date  . APPENDECTOMY    . PARATHYROIDECTOMY      There were no vitals filed for this visit.  Subjective Assessment - 10/29/17 1518    Subjective  Pt reports mild ache in back after last session for the rest of the day; relieved by next day post nights rest. Pt notes little to no ache currently    Pertinent History  Balance. Pt had lots of falls. Has not had one in the last 2 months. Prior to that, pt had a fall coming out of church, or when just standing. Does not know why. Pt just feels sore and bruised, has not broken any bones during her recent falls. 5 years ago, pt fell and had a concussion. Other than that, she just has bruising. Currently afraid of falling and breaking a hip. No dizziness or lightheadedness. Everything is fine but the next thing she knows, she is on the floor or sidewalk. Pt states that she does not have any stamina or energy. Cannot walk real far without getting short of breath.  Does not have severe back pain. Can't straighten up when she gets up in the morning. Has a hx of L4 and L5 herniated discs. Had PT in the past for it.  For most of  adult life, she would have episodes of acute back pain which would improve for 9-10 months. Now feels a constant dull ache since about 5 years ago.   Falls began about 5 years ago but has fallen more frequently in the last 6 months.  Pt is retired and volunteers a lot. But when at home pt tends to lie down and watch TV, Netflix. Pt feels out of shape and feels like she cannot go long without getting out of breath. 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   Pt states she did aquatherapy before and did a lot of stuff in the pool but when she got out of the pool, her legs felt like rubber.       Enters/exits via ramp Participates in the following  Ambulation, re education on postural/core control during  Fwd, 4L  Side, 4 L  Suspended work, 1 min work Research scientist (physical sciences) with 15-20 sec rest btn  Jog  Ski  Jack  X [employer] stabilization with UE work 2 x 10 ea  Red dumbbells   Triceps press downs   Sh abd/add    Sh flex/ext   Sh horiz abd/add  Suspended work, as above  Core stabilization with LE  work at bench, 2 x 10 ea  1# ankle wts   SKTC, B   Straight leg in ER, outward circles, B  Modified plank series, 3 x 30 sec ea  Fwd  Side, B  Stretch, at step 3 x 10 sec ea  ham  hip flexor                         PT Education - 10/29/17 1519    Education provided  Yes    Education Details  Re instructed in core stabilization with suspended work as well as less use of arms to control core/trunk movement for improved tolerance     Person(s) Educated  Patient    Methods  Explanation;Demonstration;Verbal cues          PT Long Term Goals - 10/21/17 1810      PT LONG TERM GOAL #1   Title  Patient will improve her Berg Balance test score to at least 46/56 as a demonstration of improved balance, and decreased fall risk.     Baseline  37/56 (09/10/2017); 40/56 (10/21/2017)    Time  6    Period  Weeks    Status  Partially Met    Target Date  12/05/17      PT LONG TERM GOAL  #2   Title  Patient will improve bilateral hip strength by at least 1/2 MMT to promote ability to ambulate, and perform standing tasks.     Time  6    Period  Weeks    Status  Partially Met    Target Date  12/05/17      PT LONG TERM GOAL #3   Title  Pt will improve her FOTO score to 53 or more as a demonstration of improved function.     Baseline  46 (09/10/2017); 47 (10/02/2017)    Time  6    Period  Weeks    Status  On-going    Target Date  12/05/17      PT LONG TERM GOAL #4   Title  Patient will improve her LEFS score by at least 9 points as a demonstration of improved function.     Baseline  46/80 (09/10/2017); 48/80 (10/02/2017)    Time  6    Period  Weeks    Status  --    Target Date  12/05/17            Plan - 10/29/17 1522    Clinical Impression Statement  Pt with improved technique/following instruction for core stabilization during suspended work; also omitted one round this session for greater tolerance at this time. Pt without complaint post session    Rehab Potential  Fair    Clinical Impairments Affecting Rehab Potential  Chronicity of condition, increased falls, deconditioning, hip weakness, back pain/discomfort, motivation level for exercise    PT Frequency  2x / week    PT Duration  6 weeks    PT Treatment/Interventions  Aquatic Therapy;Electrical Stimulation;Iontophoresis 29m/ml Dexamethasone;Traction;Ultrasound;Gait training;Therapeutic activities;Therapeutic exercise;Balance training;Neuromuscular re-education;Patient/family education;Manual techniques;Dry needling traction if appropriate    PT Next Visit Plan  LE strengthening, increase activity tolerance.     Consulted and Agree with Plan of Care  Patient       Patient will benefit from skilled therapeutic intervention in order to improve the following deficits and impairments:  Postural dysfunction, Pain, Improper body mechanics, Difficulty walking, Decreased strength, Decreased endurance, Decreased balance,  Decreased activity tolerance  Visit  Diagnosis: Difficulty in walking, not elsewhere classified  History of falling  Unsteadiness on feet  Muscle weakness (generalized)     Problem List Patient Active Problem List   Diagnosis Date Noted  . Acute bronchitis 10/06/2016    Larae Grooms 10/29/2017, 3:24 PM  [place] MAIN [facility] SERVICES [address] [place], [place], [address] Phone: [phone]   Fax:  [phone]  Name: SHARLYNN SECKINGER MRN: [id number] Date of Birth: Jul 12, 1940

## 2017-11-06 ENCOUNTER — Ambulatory Visit: Payer: Medicare Other | Attending: Geriatric Medicine

## 2017-11-06 DIAGNOSIS — M6281 Muscle weakness (generalized): Secondary | ICD-10-CM | POA: Diagnosis present

## 2017-11-06 DIAGNOSIS — Z9181 History of falling: Secondary | ICD-10-CM | POA: Diagnosis present

## 2017-11-06 DIAGNOSIS — R2681 Unsteadiness on feet: Secondary | ICD-10-CM | POA: Diagnosis present

## 2017-11-06 DIAGNOSIS — R262 Difficulty in walking, not elsewhere classified: Secondary | ICD-10-CM | POA: Insufficient documentation

## 2017-11-06 NOTE — Therapy (Signed)
[facility] AND [facility] MEDICINE [name] [place], [place], [address] Phone: [phone]   Fax:  [phone]  Physical Therapy Treatment  Patient Details  Name: Kara Pennington MRN: [id number] Date of Birth: 04/18/1941 Referring Provider: Susanna Thach, MD   Encounter Date: 11/06/2017  PT End of Session - 11/06/17 0957    Visit Number  14    Number of Visits  25    Date for PT Re-Evaluation  12/05/17    PT Start Time  0957 pt arrived late. Went to pool accidentally    PT Stop Time  1032    PT Time Calculation (min)  35 min    Activity Tolerance  Patient tolerated treatment well    Behavior During Therapy  WFL for tasks assessed/performed       Past Medical History:  Diagnosis Date  . Anxiety   . Depression   . GERD (gastroesophageal reflux disease)   . Hypertension     Past Surgical History:  Procedure Laterality Date  . APPENDECTOMY    . PARATHYROIDECTOMY      There were no vitals filed for this visit.  Subjective Assessment - 11/06/17 0958    Subjective  Back is pretty good. Still has momentary feelings that she needs to stabilize herself when she goes up or down a curb.     Pertinent History  Balance. Pt had lots of falls. Has not had one in the last 2 months. Prior to that, pt had a fall coming out of church, or when just standing. Does not know why. Pt just feels sore and bruised, has not broken any bones during her recent falls. 5 years ago, pt fell and had a concussion. Other than that, she just has bruising. Currently afraid of falling and breaking a hip. No dizziness or lightheadedness. Everything is fine but the next thing she knows, she is on the floor or sidewalk. Pt states that she does not have any stamina or energy. Cannot walk real far without getting short of breath.  Does not have severe back pain. Can't straighten up when she gets up in the morning. Has a hx of L4 and L5 herniated discs. Had PT in the past for  it.  For most of adult life, she would have episodes of acute back pain which would improve for 9-10 months. Now feels a constant dull ache since about 5 years ago.   Falls began about 5 years ago but has fallen more frequently in the last 6 months.  Pt is retired and volunteers a lot. But when at home pt tends to lie down and watch TV, Netflix. Pt feels out of shape and feels like she cannot go long without getting out of breath. Dreads walking her golden retriver mix dog.   Pt states she did aquatherapy before and did a lot of stuff in the pool but when she got out of the pool, her legs felt like rubber.     Currently in Pain?  No/denies                               PT Education - 11/06/17 1839    Education provided  Yes    Education Details  ther-ex    Person(s) Educated  Patient    Methods  Explanation;Demonstration;Tactile cues;Verbal cues    Comprehension  Returned demonstration;Verbalized understanding           Objectives   FOTO visit 12/12  No latex allergies  Perform Recert on 12/03/2017 appt  L lumbar paraspinal muscle tension palpated  Ther-ex  Seated  chin tucks 10x5 seconds for 2 sets.   Seated anterior and posterior pelvic tilt 10x2  Seated manually resisted scapular retraction targeting lower trap muscles 10x3 with 5 second holds   Standing LE leg press resisting blue band 5x each LE to promote glute max strengthening. Difficult.  Improved exercise technique, movement at target joints, use of target muscles after min to mod verbal, visual, tactile cues.       Manual therapy  Seated STM L low back paraspinal muscles. Pt states back felt better afterwards.    Continued working on decreasing low back pressure, improving scapular strength/thoracic extension, glute strengthening and decreasing L lumbar paraspinal muscle tension to promote back comfort as well as improve balance. Pt tolerated session well without  aggravation of symptoms.     PT Long Term Goals - 10/21/17 1810      PT LONG TERM GOAL #1   Title  Patient will improve her Berg Balance test score to at least 46/56 as a demonstration of improved balance, and decreased fall risk.     Baseline  37/56 (09/10/2017); 40/56 (10/21/2017)    Time  6    Period  Weeks    Status  Partially Met    Target Date  12/05/17      PT LONG TERM GOAL #2   Title  Patient will improve bilateral hip strength by at least 1/2 MMT to promote ability to ambulate, and perform standing tasks.     Time  6    Period  Weeks    Status  Partially Met    Target Date  12/05/17      PT LONG TERM GOAL #3   Title  Pt will improve her FOTO score to 53 or more as a demonstration of improved function.     Baseline  46 (09/10/2017); 47 (10/02/2017)    Time  6    Period  Weeks    Status  On-going    Target Date  12/05/17      PT LONG TERM GOAL #4   Title  Patient will improve her LEFS score by at least 9 points as a demonstration of improved function.     Baseline  46/80 (09/10/2017); 48/80 (10/02/2017)    Time  6    Period  Weeks    Status  --    Target Date  12/05/17            Plan - 11/06/17 1034    Clinical Impression Statement  Continued working on decreasing low back pressure, improving scapular strength/thoracic extension, glute strengthening and decreasing L lumbar paraspinal muscle tension to promote back comfort as well as improve balance. Pt tolerated session well without aggravation of symptoms.     Rehab Potential  Fair    Clinical Impairments Affecting Rehab Potential  Chronicity of condition, increased falls, deconditioning, hip weakness, back pain/discomfort, motivation level for exercise    PT Frequency  2x / week    PT Duration  6 weeks    PT Treatment/Interventions  Aquatic Therapy;Electrical Stimulation;Iontophoresis 4mg/ml Dexamethasone;Traction;Ultrasound;Gait training;Therapeutic activities;Therapeutic exercise;Balance training;Neuromuscular  re-education;Patient/family education;Manual techniques;Dry needling traction if appropriate    PT Next Visit Plan  LE strengthening, increase activity tolerance.     Consulted and Agree with Plan of Care  Patient       Patient will benefit   from skilled therapeutic intervention in order to improve the following deficits and impairments:  Postural dysfunction, Pain, Improper body mechanics, Difficulty walking, Decreased strength, Decreased endurance, Decreased balance, Decreased activity tolerance  Visit Diagnosis: Difficulty in walking, not elsewhere classified  History of falling  Muscle weakness (generalized)     Problem List Patient Active Problem List   Diagnosis Date Noted  . Acute bronchitis 10/06/2016    Miguel Laygo PT, DPT   11/06/2017, 6:42 PM  [facility] [name] [place], [place], [address] Phone: [phone]   Fax:  [phone]  Name: Terria S Otoole MRN: [id number] Date of Birth: 06/10/1941   

## 2017-11-07 ENCOUNTER — Ambulatory Visit: Payer: Medicare Other

## 2017-11-07 ENCOUNTER — Other Ambulatory Visit: Payer: Self-pay

## 2017-11-07 DIAGNOSIS — R262 Difficulty in walking, not elsewhere classified: Secondary | ICD-10-CM | POA: Diagnosis not present

## 2017-11-07 DIAGNOSIS — R2681 Unsteadiness on feet: Secondary | ICD-10-CM

## 2017-11-07 DIAGNOSIS — Z9181 History of falling: Secondary | ICD-10-CM

## 2017-11-07 DIAGNOSIS — M6281 Muscle weakness (generalized): Secondary | ICD-10-CM

## 2017-11-07 NOTE — Therapy (Signed)
[place] [facility] SERVICES [address] [place], [place], [address] Phone: [phone]   Fax:  [phone]  Physical Therapy Treatment  Patient Details  Name: Kara Pennington MRN: [id number] Date of Birth: 1940/11/07 Referring Provider: Cora Daniels, MD   Encounter Date: 11/07/2017  PT End of Session - 11/07/17 1453    Visit Number  15    Number of Visits  25    Date for PT Re-Evaluation  12/05/17    PT Start Time  0935    PT Stop Time  1020    PT Time Calculation (min)  45 min    Activity Tolerance  Patient limited by fatigue;Other (comment)       Past Medical History:  Diagnosis Date  . Anxiety   . Depression   . GERD (gastroesophageal reflux disease)   . Hypertension     Past Surgical History:  Procedure Laterality Date  . APPENDECTOMY    . PARATHYROIDECTOMY      There were no vitals filed for this visit.  Subjective Assessment - 11/07/17 1448    Subjective  Pt reports increased pain beginning last night in R low back (opposite of usual side of pain); pain continues today despite usual pain medication this morning. Pt notes working on a standing resistance exercise that pt found very difficult and feels this may have caused. Pt denies any other strenuous activity at home or event, such as fall to cause increased pain.     Pertinent History  Balance. Pt had lots of falls. Has not had one in the last 2 months. Prior to that, pt had a fall coming out of church, or when just standing. Does not know why. Pt just feels sore and bruised, has not broken any bones during her recent falls. 5 years ago, pt fell and had a concussion. Other than that, she just has bruising. Currently afraid of falling and breaking a hip. No dizziness or lightheadedness. Everything is fine but the next thing she knows, she is on the floor or sidewalk. Pt states that she does not have any stamina or energy. Cannot walk real far without getting short of breath.   Does not have severe back pain. Can't straighten up when she gets up in the morning. Has a hx of L4 and L5 herniated discs. Had PT in the past for it.  For most of adult life, she would have episodes of acute back pain which would improve for 9-10 months. Now feels a constant dull ache since about 5 years ago.   Falls began about 5 years ago but has fallen more frequently in the last 6 months.  Pt is retired and volunteers a lot. But when at home pt tends to lie down and watch TV, Netflix. Pt feels out of shape and feels like she cannot go long without getting out of breath. 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   Pt states she did aquatherapy before and did a lot of stuff in the pool but when she got out of the pool, her legs felt like rubber.       Enters/exits via ramp Participates in the following  Ambulation, blue dumbbells, instructed in slower this date due to LB pain  4 L fwd  4 L side  Slow deep squat for hip range/LB/glut stretch, 20x   UE/scap stabilization bench scoot, 1 L  Bench bike, slower pace with increased range, upper back supported, 5 min for LB/glut muscle  loosening  Core strength with UE work in tall stand posture with shoulders out of water for less resistance  Red dumbbells, 2 x 10 ea   Triceps press down   Sh abd/add   Sh flex/ext   Sh horz abd/add  Step ups on mobile step, no UE support to light touch for eccentric control on R  R/L 20x ea  L step up and over (down with R), 12x   Ceased due to pt not feeling well; see clinical impression                           PT Education - 11/07/17 1451    Education provided  Yes    Education Details  Whole back stretching; QL stretching, concept of using water with slow larger range motions to encourage relaxation an stretch to affected area and avoidance of increased water activiti/strengthening at this time.     Person(s) Educated  Patient    Methods  Explanation;Demonstration;Verbal  cues    Comprehension  Verbalized understanding;Returned demonstration;Verbal cues required          PT Long Term Goals - 10/21/17 1810      PT LONG TERM GOAL #1   Title  Patient will improve her Berg Balance test score to at least 46/56 as a demonstration of improved balance, and decreased fall risk.     Baseline  37/56 (09/10/2017); 40/56 (10/21/2017)    Time  6    Period  Weeks    Status  Partially Met    Target Date  12/05/17      PT LONG TERM GOAL #2   Title  Patient will improve bilateral hip strength by at least 1/2 MMT to promote ability to ambulate, and perform standing tasks.     Time  6    Period  Weeks    Status  Partially Met    Target Date  12/05/17      PT LONG TERM GOAL #3   Title  Pt will improve her FOTO score to 53 or more as a demonstration of improved function.     Baseline  46 (09/10/2017); 47 (10/02/2017)    Time  6    Period  Weeks    Status  On-going    Target Date  12/05/17      PT LONG TERM GOAL #4   Title  Patient will improve her LEFS score by at least 9 points as a demonstration of improved function.     Baseline  46/80 (09/10/2017); 48/80 (10/02/2017)    Time  6    Period  Weeks    Status  --    Target Date  12/05/17            Plan - 11/07/17 1454    Clinical Impression Statement  Pt was tolerating lower level exercise session today well until approximately 40 minutes in when therapist noted pt not looking well (looked fatigued and less engaged). Upon questioning pt reports fatigue and mild dizziness. Pt assisted to sit on steps for several minutes with manual HR check (88bpm) then out of pool. Pt seated on bench on pool deck. Pt initially denies any change in normal morning rountine. Pt continuing to feel mildly dizzy and worn out without improvement or worsening. Nurse contacted; at time of arrival pt had been lying on bench for several minutes. BP checked in sitting 150/80 (pt takes BP medication Losartin with subjecitve normal BP  around  140/70 per pt. Pt wished to just rest then drive home. Requested pt contact someone to drive her; pt attempted several friends without success. Pt reports feeling better (no longer dizzy) after about 20 minutes and wished to drive home. Escorted pt to car and requested pt contact front office of her outpt clinic to let me know she returned home okay. Advised pt to seek medical attension if worsens. Pt states understanding.     Rehab Potential  Fair    Clinical Impairments Affecting Rehab Potential  Chronicity of condition, increased falls, deconditioning, hip weakness, back pain/discomfort, motivation level for exercise    PT Frequency  2x / week    PT Duration  6 weeks    PT Treatment/Interventions  Aquatic Therapy;Electrical Stimulation;Iontophoresis 61m/ml Dexamethasone;Traction;Ultrasound;Gait training;Therapeutic activities;Therapeutic exercise;Balance training;Neuromuscular re-education;Patient/family education;Manual techniques;Dry needling traction if appropriate    PT Next Visit Plan  LE strengthening, increase activity tolerance.     Consulted and Agree with Plan of Care  Patient       Patient will benefit from skilled therapeutic intervention in order to improve the following deficits and impairments:  Postural dysfunction, Pain, Improper body mechanics, Difficulty walking, Decreased strength, Decreased endurance, Decreased balance, Decreased activity tolerance  Visit Diagnosis: Difficulty in walking, not elsewhere classified  History of falling  Muscle weakness (generalized)  Unsteadiness on feet     Problem List Patient Active Problem List   Diagnosis Date Noted  . Acute bronchitis 10/06/2016    HLarae Grooms5/08/2017, 3:06 PM  [facility] [address] [address]Phone: [phone]  Fax:  [phone] Name: Kara CASIMIRMRN: [id number] of Birth: [birth year]

## 2017-11-12 ENCOUNTER — Ambulatory Visit: Payer: Medicare Other

## 2017-11-19 ENCOUNTER — Ambulatory Visit: Payer: Medicare Other

## 2017-11-21 ENCOUNTER — Ambulatory Visit: Payer: Medicare Other

## 2017-11-21 ENCOUNTER — Other Ambulatory Visit: Payer: Self-pay

## 2017-11-21 DIAGNOSIS — R2681 Unsteadiness on feet: Secondary | ICD-10-CM

## 2017-11-21 DIAGNOSIS — R262 Difficulty in walking, not elsewhere classified: Secondary | ICD-10-CM | POA: Diagnosis not present

## 2017-11-21 DIAGNOSIS — Z9181 History of falling: Secondary | ICD-10-CM

## 2017-11-21 DIAGNOSIS — M6281 Muscle weakness (generalized): Secondary | ICD-10-CM

## 2017-11-21 NOTE — Therapy (Signed)
[place] [facility]IN [facility] SERVICES [address] [place], [place], [address] Phone: [phone]   Fax:  [phone]  Physical Therapy Treatment  Patient Details  Name: Kara Pennington MRN: [id number] Date of Birth: [birth year] Referring Provider: Cora Daniels, MD   Encounter Date: 11/21/2017  PT End of Session - 11/21/17 1420    Visit Number  16    Number of Visits  25    Date for PT Re-Evaluation  12/05/17    PT Start Time  0945    PT Stop Time  1030    PT Time Calculation (min)  45 min    Activity Tolerance  Patient limited by fatigue;Other (comment)    Behavior During Therapy  WFL for tasks assessed/performed       Past Medical History:  Diagnosis Date  . Anxiety   . Depression   . GERD (gastroesophageal reflux disease)   . Hypertension     Past Surgical History:  Procedure Laterality Date  . APPENDECTOMY    . PARATHYROIDECTOMY      There were no vitals filed for this visit.  Subjective Assessment - 11/21/17 1415    Subjective  Pt has been absent from aquatics since incident of not feeling well during session. Pt reports recovering without need to see a doctor. 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 Pt reports being placed on Eloquis and will follow up with this vascular doctor in a month. Pt states she has no restrictions and was in fact told to continue exercising. Pt denies pain currently and generally feels okay. Pt would like to keep session more on the light side without any fast paced exercises today. Therapist agreeable and kept session focus on strengthening and balance.     Pertinent History  Balance. Pt had lots of falls. Has not had one in the last 2 months. Prior to that, pt had a fall coming out of church, or when just standing. Does not know why. Pt just feels sore and bruised, has  not broken any bones during her recent falls. 5 years ago, pt fell and had a concussion. Other than that, she just has bruising. Currently afraid of falling and breaking a hip. No dizziness or lightheadedness. Everything is fine but the next thing she knows, she is on the floor or sidewalk. Pt states that she does not have any stamina or energy. Cannot walk real far without getting short of breath.  Does not have severe back pain. Can't straighten up when she gets up in the morning. Has a hx of L4 and L5 herniated discs. Had PT in the past for it.  For most of adult life, she would have episodes of acute back pain which would improve for 9-10 months. Now feels a constant dull ache since about 5 years ago.   Falls began about 5 years ago but has fallen more frequently in the last 6 months.  Pt is retired and volunteers a lot. But when at home pt tends to lie down and watch TV, Netflix. Pt feels out of shape and feels like she cannot go long without getting out of breath. 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   Pt states she did aquatherapy before and did a lot of stuff in the pool but when she got out of the pool, her legs  felt like rubber.       Enters/exits pool via ramp   Ambulation, blue dumbbells  4 L fwd  4 L side   Core with LE strength, 20x ea  Hip abd/add  Hip flex/ext  Squats  Core with UE strength, 2 x 10 ea  Red dumbbells   Triceps press downs   Sh abd/add   Sh flex/ext   Sh horiz abd/add  Balance activities with ball toss, 15 min  Static stand, nuetral  Static stand, feet together  Dynamic step fwd/bkwd, R and L  Static SL stand, toe touch opposite, R and L  Side stepping, traveling                          PT Education - 11/21/17 1420    Education provided  Yes    Education Details  Balance activities     Person(s) Educated  Patient    Methods  Explanation;Demonstration          PT Long Term Goals - 10/21/17 1810      PT LONG TERM  GOAL #1   Title  Patient will improve her Berg Balance test score to at least 46/56 as a demonstration of improved balance, and decreased fall risk.     Baseline  37/56 (09/10/2017); 40/56 (10/21/2017)    Time  6    Period  Weeks    Status  Partially Met    Target Date  12/05/17      PT LONG TERM GOAL #2   Title  Patient will improve bilateral hip strength by at least 1/2 MMT to promote ability to ambulate, and perform standing tasks.     Time  6    Period  Weeks    Status  Partially Met    Target Date  12/05/17      PT LONG TERM GOAL #3   Title  Pt will improve her FOTO score to 53 or more as a demonstration of improved function.     Baseline  46 (09/10/2017); 47 (10/02/2017)    Time  6    Period  Weeks    Status  On-going    Target Date  12/05/17      PT LONG TERM GOAL #4   Title  Patient will improve her LEFS score by at least 9 points as a demonstration of improved function.     Baseline  46/80 (09/10/2017); 48/80 (10/02/2017)    Time  6    Period  Weeks    Status  --    Target Date  12/05/17            Plan - 11/21/17 1421    Clinical Impression Statement  Pt tolerated session well with focus on primarily core strength and balance. Pt has no complaints of dizziness or other adverse reactions during/after session and is pleased with level of work. Balance work challenging    Rehab Potential  Fair    Clinical Impairments Affecting Rehab Potential  Chronicity of condition, increased falls, deconditioning, hip weakness, back pain/discomfort, motivation level for exercise    PT Frequency  2x / week    PT Duration  6 weeks    PT Treatment/Interventions  Aquatic Therapy;Electrical Stimulation;Iontophoresis 37m/ml Dexamethasone;Traction;Ultrasound;Gait training;Therapeutic activities;Therapeutic exercise;Balance training;Neuromuscular re-education;Patient/family education;Manual techniques;Dry needling traction if appropriate    PT Next Visit Plan  LE strengthening, increase activity  tolerance.     Consulted and Agree with Plan of Care  Patient       Patient will benefit from skilled therapeutic intervention in order to improve the following deficits and impairments:  Postural dysfunction, Pain, Improper body mechanics, Difficulty walking, Decreased strength, Decreased endurance, Decreased balance, Decreased activity tolerance  Visit Diagnosis: Difficulty in walking, not elsewhere classified  History of falling  Muscle weakness (generalized)  Unsteadiness on feet     Problem List Patient Active Problem List   Diagnosis Date Noted  . Acute bronchitis 10/06/2016    Larae Grooms 11/21/2017, 2:23 PM  [place] MAIN [facility] SERVICES [address] [place], [place], [address] Phone: [phone]   Fax:  [phone]  Name: Kara Pennington MRN: [id number] Date of Birth: Oct 11, 1940

## 2017-11-25 ENCOUNTER — Ambulatory Visit: Payer: Medicare Other

## 2017-11-25 DIAGNOSIS — R262 Difficulty in walking, not elsewhere classified: Secondary | ICD-10-CM

## 2017-11-25 DIAGNOSIS — Z9181 History of falling: Secondary | ICD-10-CM

## 2017-11-25 DIAGNOSIS — M6281 Muscle weakness (generalized): Secondary | ICD-10-CM

## 2017-11-25 DIAGNOSIS — R2681 Unsteadiness on feet: Secondary | ICD-10-CM

## 2017-11-25 NOTE — Therapy (Signed)
[facility] [address] [place], [address] Phone: [phone]   Fax:  [phone]  Physical Therapy Treatment  Patient Details  Name: Kara Pennington MRN: [id number] Date of Birth: Nov 10, 1940 Referring Provider: Cora Daniels, MD   Encounter Date: 11/25/2017  PT End of Session - 11/25/17 1437    Visit Number  17    Number of Visits  25    Date for PT Re-Evaluation  12/05/17    PT Start Time  9373    PT Stop Time  1520    PT Time Calculation (min)  43 min    Activity Tolerance  Patient limited by fatigue;Other (comment)    Behavior During Therapy  WFL for tasks assessed/performed       Past Medical History:  Diagnosis Date  . Anxiety   . Depression   . GERD (gastroesophageal reflux disease)   . Hypertension     Past Surgical History:  Procedure Laterality Date  . APPENDECTOMY    . PARATHYROIDECTOMY      There were no vitals filed for this visit.  Subjective Assessment - 11/25/17 1438    Subjective  Pt  states that her doctor told her to stay active and don't just lie around pertaining to her R inside thigh superficial thrombophlebitis. Does not hurt anymore.  No falls recently. Had a feeling like she was going to fall several times but has regained her balance. The sensasions of feeling like she is going to fall is about the same. The aqua therapy is helping with her endurance.  Feel almost normal endurance wise.  Has a pool at her apartment comlex outside.  No pain of discomfort currently.  Can better turn her head when driving after the manual therapy.     Pertinent History  Balance. Pt had lots of falls. Has not had one in the last 2 months. Prior to that, pt had a fall coming out of church, or when just standing. Does not know why. Pt just feels sore and bruised, has not broken any bones during her recent falls. 5 years ago, pt fell and had a concussion. Other than that, she just has bruising.  Currently afraid of falling and breaking a hip. No dizziness or lightheadedness. Everything is fine but the next thing she knows, she is on the floor or sidewalk. Pt states that she does not have any stamina or energy. Cannot walk real far without getting short of breath.  Does not have severe back pain. Can't straighten up when she gets up in the morning. Has a hx of L4 and L5 herniated discs. Had PT in the past for it.  For most of adult life, she would have episodes of acute back pain which would improve for 9-10 months. Now feels a constant dull ache since about 5 years ago.   Falls began about 5 years ago but has fallen more frequently in the last 6 months.  Pt is retired and volunteers a lot. But when at home pt tends to lie down and watch TV, Netflix. Pt feels out of shape and feels like she cannot go long without getting out of breath. 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   Pt states she did aquatherapy before and did a lot of stuff in the pool but when she got out of the pool, her legs felt like rubber.     Currently in Pain?  No/denies  [facility] PT Assessment - 11/25/17 2055      Observation/Other Assessments   Focus on Therapeutic Outcomes (FOTO)   49    Lower Extremity Functional Scale   49                           PT Education - 11/25/17 2050    Education provided  Yes    Education Details  ther-ex    [employer]) Educated  Patient    Methods  Explanation;Demonstration;Tactile cues;Verbal cues    Comprehension  Returned demonstration;Verbalized understanding           Objectives  Called nurse's Melvia Heaps, NP) office earlier pertaining to her phlebitis and was given verbal clearance to continue PT by Caryl Pina, another nurse from her office at [facility]. No activity restrictions. She can participate in PT (11:43 am)    FOTO visit   No latex allergies  Perform Recert on 10/22/6061 appt       Manual therapy   Seated  STM L and R low back paraspinal muscles. Pt states back felt better afterwards  STM L upper trap and rhomboid muscle area secondary to more tightness compared to R            Pt was recommended to get her blood vessels in her neck checked out secondary to sensasions of wobbliness when turning her head to end range before. Pt verbalized understanding.     Ther-ex  Seated chin tucks 10x5 seconds for 2 sets.  Seated anterior and posterior pelvic tilt 10x2  Standing LE leg press resisting green band 10x each LE to promote glute max strengthening. B UE assist.  Seated manually resisted scapular retraction targeting lower trap muscles   R 10x3 with 5 second holds     L 10x3 with 5 second holds   Improved exercise technique, movement at target joints, use of target muscles after min to mod verbal, visual, tactile cues.   Continued working on decreasing lumbar paraspinal and upper trap muscle tension to help decrease low back discomfort as well as improve ability to turn her head with decreased sensation of "wobbliness." Pt tolerated session well without aggravation of symptoms.  .     PT Long Term Goals - 10/21/17 1810      PT LONG TERM GOAL #1   Title  Patient will improve her Berg Balance test score to at least 46/56 as a demonstration of improved balance, and decreased fall risk.     Baseline  37/56 (09/10/2017); 40/56 (10/21/2017)    Time  6    Period  Weeks    Status  Partially Met    Target Date  12/05/17      PT LONG TERM GOAL #2   Title  Patient will improve bilateral hip strength by at least 1/2 MMT to promote ability to ambulate, and perform standing tasks.     Time  6    Period  Weeks    Status  Partially Met    Target Date  12/05/17      PT LONG TERM GOAL #3   Title  Pt will improve her FOTO score to 53 or more as a demonstration of improved function.     Baseline  46 (09/10/2017); 47 (10/02/2017)    Time  6    Period  Weeks    Status  On-going    Target  Date  12/05/17      PT LONG  TERM GOAL #4   Title  Patient will improve her LEFS score by at least 9 points as a demonstration of improved function.     Baseline  46/80 (09/10/2017); 48/80 (10/02/2017)    Time  6    Period  Weeks    Status  --    Target Date  12/05/17            Plan - 11/25/17 1500    Clinical Impression Statement  Continued working on decreasing lumbar paraspinal and upper trap muscle tension to help decrease low back discomfort as well as improve ability to turn her head with decreased sensation of "wobbliness." Pt tolerated session well without aggravation of symptoms.     Rehab Potential  Fair    Clinical Impairments Affecting Rehab Potential  Chronicity of condition, increased falls, deconditioning, hip weakness, back pain/discomfort, motivation level for exercise    PT Frequency  2x / week    PT Duration  6 weeks    PT Treatment/Interventions  Aquatic Therapy;Electrical Stimulation;Iontophoresis 62m/ml Dexamethasone;Traction;Ultrasound;Gait training;Therapeutic activities;Therapeutic exercise;Balance training;Neuromuscular re-education;Patient/family education;Manual techniques;Dry needling traction if appropriate    PT Next Visit Plan  LE strengthening, increase activity tolerance.     Consulted and Agree with Plan of Care  Patient       Patient will benefit from skilled therapeutic intervention in order to improve the following deficits and impairments:  Postural dysfunction, Pain, Improper body mechanics, Difficulty walking, Decreased strength, Decreased endurance, Decreased balance, Decreased activity tolerance  Visit Diagnosis: Difficulty in walking, not elsewhere classified  History of falling  Muscle weakness (generalized)  Unsteadiness on feet     Problem List Patient Active Problem List   Diagnosis Date Noted  . Acute bronchitis 10/06/2016   MJoneen BoersPT, DPT   11/25/2017, 8:58 PM  [facility] [address] [place] [address]Phone: [phone]  Fax:  [phone] Name: Kara CUMBEEMRN: [id number] of Birth: [birth year]

## 2017-11-28 ENCOUNTER — Ambulatory Visit: Payer: Medicare Other

## 2017-11-28 DIAGNOSIS — R2681 Unsteadiness on feet: Secondary | ICD-10-CM

## 2017-11-28 DIAGNOSIS — R262 Difficulty in walking, not elsewhere classified: Secondary | ICD-10-CM | POA: Diagnosis not present

## 2017-11-28 DIAGNOSIS — Z9181 History of falling: Secondary | ICD-10-CM

## 2017-11-28 DIAGNOSIS — M6281 Muscle weakness (generalized): Secondary | ICD-10-CM

## 2017-11-28 NOTE — Therapy (Signed)
[facility] [address], [place], [address] Phone: [phone]   Fax:  [phone]  Physical Therapy Treatment  Patient Details  Name: Kara Pennington MRN: [id number] Date of Birth: May 12, 1941 Referring Provider: Cora Daniels, MD   Encounter Date: 11/28/2017  PT End of Session - 11/28/17 1436    Visit Number  18    Number of Visits  25    Date for PT Re-Evaluation  12/05/17    PT Start Time  1287    PT Stop Time  8676    PT Time Calculation (min)  37 min    Activity Tolerance  Patient limited by fatigue;Other (comment)    Behavior During Therapy  WFL for tasks assessed/performed       Past Medical History:  Diagnosis Date  . Anxiety   . Depression   . GERD (gastroesophageal reflux disease)   . Hypertension     Past Surgical History:  Procedure Laterality Date  . APPENDECTOMY    . PARATHYROIDECTOMY      There were no vitals filed for this visit.  Subjective Assessment - 11/28/17 1438    Subjective  Pt states that she wobbled once or twice this week about a day or so ago.  Pt would catch herself before she completely loses her balance. No falls.  Pt was walking from one room to another when she wobbled.  Pt also states she felt [place] [facility] [place] [employer] which lasted 10-15 minutes today.  Room does not feel like it is spinning. Has had the room spinning before but not for the last few years.  The lightheadedness happened several times this week and today.    Pt states she ate a small bag of pop corn this morning and had half a steak buscuit from [employer] and sweet tea around 1 pm.  Pt states that she has not really had an appetite this week.  Pt states that her mom has diabetes. Does not have diabetes yet.  Pt adds that her digits 2-5 bilateral feet burns sometimes (L4 dermatome ?). Pt states getting new glasses in Decmeber 2018. Pt told the doctor she is having floaters. Told him  that she had an instance of double vision that lasted for 10 minutes. Her doctor did not really seemed worried about it.      Pertinent History  Balance. Pt had lots of falls. Has not had one in the last 2 months. Prior to that, pt had a fall coming out of church, or when just standing. Does not know why. Pt just feels sore and bruised, has not broken any bones during her recent falls. 5 years ago, pt fell and had a concussion. Other than that, she just has bruising. Currently afraid of falling and breaking a hip. No dizziness or lightheadedness. Everything is fine but the next thing she knows, she is on the floor or sidewalk. Pt states that she does not have any stamina or energy. Cannot walk real far without getting short of breath.  Does not have severe back pain. Can't straighten up when she gets up in the morning. Has a hx of L4 and L5 herniated discs. Had PT in the past for it.  For most of adult life, she would have episodes of acute back pain which would improve for 9-10 months. Now feels a constant dull ache since about 5 years ago.   Falls began about 5 years ago but  has fallen more frequently in the last 6 months.  Pt is retired and volunteers a lot. But when at home pt tends to lie down and watch TV, Netflix. Pt feels out of shape and feels like she cannot go long without getting out of breath. 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   Pt states she did aquatherapy before and did a lot of stuff in the pool but when she got out of the pool, her legs felt like rubber.     Currently in Pain?  No/denies    Pain Score  0-No pain         [facility] PT Assessment - 11/28/17 1448      Berg Balance Test   Sit to Stand  Able to stand without using hands and stabilize independently    Standing Unsupported  Able to stand 2 minutes with supervision Slight LE wobbliness after about 1 min 15 seconds    Sitting with Back Unsupported but Feet Supported on Floor or Stool  Able to sit safely and securely 2  minutes    Stand to Sit  Sits safely with minimal use of hands    Transfers  Able to transfer safely, minor use of hands    Standing Unsupported with Eyes Closed  Able to stand 10 seconds with supervision    Standing Ubsupported with Feet Together  Able to place feet together independently and stand for 1 minute with supervision    From Standing, Reach Forward with Outstretched Arm  Can reach forward >12 cm safely (5")    From Standing Position, Pick up Object from [employer] to pick up shoe safely and easily                           PT Education - 11/28/17 1444    Education provided  Yes    Education Details  ther-ex    [employer]) Educated  Patient    Methods  Explanation;Demonstration;Tactile cues;Verbal cues    Comprehension  Returned demonstration;Verbalized understanding         FOTO visit   No latex allergies  Perform Recert on 9/40/7680 appt    Decreased stance R LE   Pt states no dizziness currently at start of session.     Ther-ex   BP L arm sitting, 128/76, HR 81 BPM, 98% SpO2, mechanically taken  Directed patient with sit <> stand throughout session with and without UE assist Stand pivot transfer chair <> low mat table Standing with shoulder width apart x 2 min  Double vision when looking down  Seated modified VBI test  (-) bilaterally   Static standing shoulder width apart, then with eyes closed,  then with eyes open feet together,  Picking up an object (computer mouse) from the floor,   Standing forward reach   Pt states feeling light headed afterwards. Has had that before a few weeks ago. Does not feel good. Session stopped.    She feels like she is getting a lightheaded feeling like things are not real.   SpO2 99% room air BP L arm sitting, mechanically taken, normal cuff 131/84   Improved exercise technique, movement at target joints, use of target muscles after mod verbal, visual, tactile cues.     Performed Berg balance related activities in which pt felt like she saw double vision at one point and dizziness at another point. Vitals within normal range and (-) modified VBI test. Pt states  not being dizzy after rest at the end of session. Pt was recommended to let her PCP (Dr. Nita Sickle or her replacement) and/or her vascular surgeon about her dizziness/lightheadedness. Pt verbalized understanding. Decreased balance observed when pt was standing and eye closed.  LE wobbling observed with prolonged standing.                      PT Long Term Goals - 10/21/17 1810      PT LONG TERM GOAL #1   Title  Patient will improve her Berg Balance test score to at least 46/56 as a demonstration of improved balance, and decreased fall risk.     Baseline  37/56 (09/10/2017); 40/56 (10/21/2017)    Time  6    Period  Weeks    Status  Partially Met    Target Date  12/05/17      PT LONG TERM GOAL #2   Title  Patient will improve bilateral hip strength by at least 1/2 MMT to promote ability to ambulate, and perform standing tasks.     Time  6    Period  Weeks    Status  Partially Met    Target Date  12/05/17      PT LONG TERM GOAL #3   Title  Pt will improve her FOTO score to 53 or more as a demonstration of improved function.     Baseline  46 (09/10/2017); 47 (10/02/2017)    Time  6    Period  Weeks    Status  On-going    Target Date  12/05/17      PT LONG TERM GOAL #4   Title  Patient will improve her LEFS score by at least 9 points as a demonstration of improved function.     Baseline  46/80 (09/10/2017); 48/80 (10/02/2017)    Time  6    Period  Weeks    Status  --    Target Date  12/05/17            Plan - 11/28/17 1444    Clinical Impression Statement  Performed Merrilee Jansky balance related activities in which pt felt like she saw double vision at one point and dizziness at another point. Vitals within normal range and (-) modified VBI test. Pt states not being dizzy after rest  at the end of session. Pt was recommended to let her PCP (Dr. Nita Sickle or her replacement) and/or her vascular surgeon about her dizziness/lightheadedness. Pt verbalized understanding. Decreased balance observed when pt was standing and eye closed.  LE wobbling observed with prolonged standing.     Rehab Potential  Fair    Clinical Impairments Affecting Rehab Potential  Chronicity of condition, increased falls, deconditioning, hip weakness, back pain/discomfort, motivation level for exercise    PT Frequency  2x / week    PT Duration  6 weeks    PT Treatment/Interventions  Aquatic Therapy;Electrical Stimulation;Iontophoresis 39m/ml Dexamethasone;Traction;Ultrasound;Gait training;Therapeutic activities;Therapeutic exercise;Balance training;Neuromuscular re-education;Patient/family education;Manual techniques;Dry needling traction if appropriate    PT Next Visit Plan  LE strengthening, increase activity tolerance.     Consulted and Agree with Plan of Care  Patient       Patient will benefit from skilled therapeutic intervention in order to improve the following deficits and impairments:  Postural dysfunction, Pain, Improper body mechanics, Difficulty walking, Decreased strength, Decreased endurance, Decreased balance, Decreased activity tolerance  Visit Diagnosis: Difficulty in walking, not elsewhere classified  History of falling  Muscle weakness (generalized)  Unsteadiness on feet     Problem List Patient Active Problem List   Diagnosis Date Noted  . Acute bronchitis 10/06/2016     Joneen Boers PT, DPT  11/28/2017, 7:09 PM  [facility] [address], [place], [address] Phone: [phone]   Fax:  [phone]  Name: SHYAN SCALISI MRN: [id number] Date of Birth: 09-10-1940

## 2017-12-03 ENCOUNTER — Ambulatory Visit: Payer: Medicare Other

## 2017-12-03 DIAGNOSIS — Z9181 History of falling: Secondary | ICD-10-CM

## 2017-12-03 DIAGNOSIS — R2681 Unsteadiness on feet: Secondary | ICD-10-CM

## 2017-12-03 DIAGNOSIS — R262 Difficulty in walking, not elsewhere classified: Secondary | ICD-10-CM

## 2017-12-03 DIAGNOSIS — M6281 Muscle weakness (generalized): Secondary | ICD-10-CM

## 2017-12-03 NOTE — Therapy (Signed)
[facility] [address] [place], [address] Phone: [phone]   Fax:  [phone]  Physical Therapy Treatment And Progress Report  Patient Details  Name: Kara Pennington MRN: [id number] Date of Birth: 1940/07/25 Referring Provider: Cora Daniels, MD   Encounter Date: 12/03/2017  PT End of Session - 12/03/17 1350    Visit Number  19    Number of Visits  25    Date for PT Re-Evaluation  12/05/17    PT Start Time  1351    PT Stop Time  1431    PT Time Calculation (min)  40 min    Activity Tolerance  Patient limited by fatigue;Other (comment)    Behavior During Therapy  WFL for tasks assessed/performed       Past Medical History:  Diagnosis Date  . Anxiety   . Depression   . GERD (gastroesophageal reflux disease)   . Hypertension     Past Surgical History:  Procedure Laterality Date  . APPENDECTOMY    . PARATHYROIDECTOMY      There were no vitals filed for this visit.  Subjective Assessment - 12/03/17 1352    Subjective  Has not had any instances of loosing her balance since she was here last time. Feels better than last Thursday. Went to bed afterwards and slept for about 3 hours.  Feels light headed. Seems like it is not going away now. Has not been seeing double.  No pain or discomfort anywhere.  The double vision has not been happening often. Just in the last 2-3 weeks.  Pt states the only way she can combat her lightheadedness is to take a nap. Pt also had her heart checked several years ago (normal EKG, as well as another test) and her cardiologist/doctor did not find anything wrong. Pt adds that she gets more dizzy the longer she stands. Pt states that in the last 2 weeks, pt just felt tired most of the time, more compared to before starting PT. Pt states that she is not getting much accomplished at home. Also has depression. Does not know if she is taking medications that cause dizziness. Pt also  states that she has been on Eliquis for the past 2 weeks now.      Pertinent History  Balance. Pt had lots of falls. Has not had one in the last 2 months. Prior to that, pt had a fall coming out of church, or when just standing. Does not know why. Pt just feels sore and bruised, has not broken any bones during her recent falls. 5 years ago, pt fell and had a concussion. Other than that, she just has bruising. Currently afraid of falling and breaking a hip. No dizziness or lightheadedness. Everything is fine but the next thing she knows, she is on the floor or sidewalk. Pt states that she does not have any stamina or energy. Cannot walk real far without getting short of breath.  Does not have severe back pain. Can't straighten up when she gets up in the morning. Has a hx of L4 and L5 herniated discs. Had PT in the past for it.  For most of adult life, she would have episodes of acute back pain which would improve for 9-10 months. Now feels a constant dull ache since about 5 years ago.   Falls began about 5 years ago but has fallen more frequently in the last 6 months.  Pt is retired and volunteers a  lot. But when at home pt tends to lie down and watch TV, Netflix. Pt feels out of shape and feels like she cannot go long without getting out of breath. 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   Pt states she did aquatherapy before and did a lot of stuff in the pool but when she got out of the pool, her legs felt like rubber.     Currently in Pain?  No/denies    Pain Score  0-No pain         [facility] PT Assessment - 12/03/17 1358      Berg Balance Test   Sit to Stand  Able to stand without using hands and stabilize independently    Standing Unsupported  Able to stand safely 2 minutes wider base of support observed.     Sitting with Back Unsupported but Feet Supported on Floor or Stool  Able to sit safely and securely 2 minutes    Stand to Sit  Sits safely with minimal use of hands    Transfers  Able to  transfer safely, minor use of hands    Turn 360 Degrees  Needs close supervision or verbal cueing Dizziness when turning to the R, decreases when sitting down    Standing Unsupported, One Foot in Front  Able to plae foot ahead of the other independently and hold 30 seconds    Standing on One Leg  Tries to lift leg/unable to hold 3 seconds but remains standing independently                           PT Education - 12/03/17 1855    Education provided  Yes    Education Details  ther-ex    [employer]) Educated  Patient    Methods  Explanation;Tactile cues;Demonstration;Verbal cues    Comprehension  Returned demonstration;Verbalized understanding        Objectives  Pt states the only way she can combat her lightheadedness is to take a nap. Pt also had her heart checked several years ago (normal EKG, as well as another test) and her cardiologist/doctor did not find anything wrong. Pt adds that she gets more dizzy the longer she stands. Pt states that in the last 2 weeks, pt just felt tired most of the time, more compared to before starting PT. Pt states that she is not getting much accomplished at home. Also has depression. Does not know if she is taking medications that cause dizziness. Pt also states that she has been on Eliquis for the past 2 weeks now.   Next appointment at Dr. Karie Schwalbe office and her vascular surgeon is in August, 2019.     Therapeutic exercise   Blood pressure L arm sitting: 123/77, HR 80, SpO2 98 % room air at start of session    Directed patient with sit <> stand throughout session Stand pivot transfer chair <> low mat table   tandem stance with feet shoulder width apart  Turning 360 degrees to the R and to the L 2x. Then 1x to the R.   Pt states feeling her face warm.  SLS   Seated hip adductor small physioball squeeze 10x5 seconds. Slight decrease in dizziness.   Standing again for 1 min, 30 seconds. Pt states lightheadedness  increased. Had to sit down   Blood pressure L arm sitting, 127/81, HR 85.   Sitting with feet propped onto chair. Pt stats lightheadedness and dizziness is better.  Pt was recommended to get a sooner appointment with her PCP (Dr. Nita Sickle) and tell her about her dizziness and fatigue, which increased for the past 2 weeks.  Reviewed plan of care: recommended for pt to get checked out by her PCP first prior to continueing PT secondary to combination of dizziness and increased fatigue and symptoms of double vision for the past 2 weeks to rule out any underlying factors outside of PT. Pt verbalized understanding   Improved exercise technique, movement at target joints, use of target muscles after min to mod verbal, visual, tactile cues.    Difficulty standing up after sitting after session due to lightheadedness. Vitals within normal limits when measured during session. Symptoms seem to increase with standing activities based on pt subjective reports. When offered to call a taxi, pt said that she should be fine to go home. She can always call a friend to help her. Pt wished to sit in her car turn on the [facility] and rest prior to heading home. Pt was informed that if she needed [place], she can call the clinic. Pt verbalized understanding. Due to the recent increase in fatigue (more than usual), lightheadedness, and reports of double vision during the past 2-3 weeks with addition of her face feeling increased warmth following one of the Berg balance activities, physical therapy has been postponed until cleared by MD to rule out any underlying factors outside of physical therapy that might be involved.             PT Long Term Goals - 12/03/17 1855      PT LONG TERM GOAL #1   Title  Patient will improve her Merrilee Jansky Balance test score to at least 46/56 as a demonstration of improved balance, and decreased fall risk.     Baseline  37/56 (09/10/2017); 40/56 (10/21/2017); Unable to assess (12/03/2017)    Time  6     Period  Weeks    Status  Partially Met    Target Date  12/05/17      PT LONG TERM GOAL #2   Title  Patient will improve bilateral hip strength by at least 1/2 MMT to promote ability to ambulate, and perform standing tasks.     Time  6    Period  Weeks    Status  Unable to assess    Target Date  12/05/17      PT LONG TERM GOAL #3   Title  Pt will improve her FOTO score to 53 or more as a demonstration of improved function.     Baseline  46 (09/10/2017); 47 (10/02/2017)    Time  6    Period  Weeks    Status  Unable to assess    Target Date  12/05/17      PT LONG TERM GOAL #4   Title  Patient will improve her LEFS score by at least 9 points as a demonstration of improved function.     Baseline  46/80 (09/10/2017); 48/80 (10/02/2017)    Time  6    Period  Weeks    Status  Unable to assess    Target Date  12/05/17            Plan - 12/03/17 1356    Clinical Impression Statement  Difficulty standing up after sitting after session due to lightheadedness. Vitals within normal limits when measured during session. Symptoms seem to increase with standing activities based on pt subjective reports. When offered to call a taxi,  pt said that she should be fine to go home. She can always call a friend to help her. Pt wished to sit in her car turn on the [facility] and rest prior to heading home. Pt was informed that if she needed [place], she can call the clinic. Pt verbalized understanding. Due to the recent increase in fatigue (more than usual), lightheadedness, and reports of double vision during the past 2-3 weeks with addition of her face feeling increased warmth following one of the Berg balance activities, physical therapy has been postponed until cleared by MD to rule out any underlying factors outside of physical therapy that might be involved.     Rehab Potential  Fair    Clinical Impairments Affecting Rehab Potential  Chronicity of condition, increased falls, deconditioning, hip weakness, back  pain/discomfort, motivation level for exercise    PT Frequency  2x / week    PT Duration  6 weeks    PT Treatment/Interventions  Aquatic Therapy;Electrical Stimulation;Iontophoresis 49m/ml Dexamethasone;Traction;Ultrasound;Gait training;Therapeutic activities;Therapeutic exercise;Balance training;Neuromuscular re-education;Patient/family education;Manual techniques;Dry needling traction if appropriate    PT Next Visit Plan  LE strengthening, increase activity tolerance.     Consulted and Agree with Plan of Care  Patient       Patient will benefit from skilled therapeutic intervention in order to improve the following deficits and impairments:  Postural dysfunction, Pain, Improper body mechanics, Difficulty walking, Decreased strength, Decreased endurance, Decreased balance, Decreased activity tolerance  Visit Diagnosis: Difficulty in walking, not elsewhere classified  History of falling  Muscle weakness (generalized)  Unsteadiness on feet     Problem List Patient Active Problem List   Diagnosis Date Noted  . Acute bronchitis 10/06/2016    Thank you for your referral.   MJoneen BoersPT, DPT   12/03/2017, 7:10 PM  [facility] [address] [place] [address]Phone: [phone]  Fax:  [phone] Name: Kara PEPINMRN: [id number] of Birth: 7Jun 12, 1942

## 2017-12-05 ENCOUNTER — Ambulatory Visit: Payer: Medicare Other

## 2017-12-10 ENCOUNTER — Ambulatory Visit: Payer: Medicare Other | Attending: Geriatric Medicine

## 2017-12-12 ENCOUNTER — Ambulatory Visit: Payer: Medicare Other

## 2017-12-17 ENCOUNTER — Ambulatory Visit: Payer: Medicare Other

## 2017-12-19 ENCOUNTER — Ambulatory Visit: Payer: Medicare Other

## 2017-12-23 ENCOUNTER — Telehealth: Payer: Self-pay

## 2017-12-23 ENCOUNTER — Ambulatory Visit: Payer: Medicare Other

## 2017-12-23 NOTE — Telephone Encounter (Signed)
Tried calling patient. No one answered the phone and unable to leave a message secondary to the voice mailbox being full.

## 2017-12-26 ENCOUNTER — Ambulatory Visit: Payer: Medicare Other

## 2017-12-31 ENCOUNTER — Ambulatory Visit: Payer: Medicare Other

## 2018-01-02 ENCOUNTER — Ambulatory Visit: Payer: Medicare Other

## 2018-01-02 ENCOUNTER — Telehealth: Payer: Self-pay

## 2018-01-02 DIAGNOSIS — R2681 Unsteadiness on feet: Secondary | ICD-10-CM

## 2018-01-02 DIAGNOSIS — R262 Difficulty in walking, not elsewhere classified: Secondary | ICD-10-CM

## 2018-01-02 DIAGNOSIS — M6281 Muscle weakness (generalized): Secondary | ICD-10-CM

## 2018-01-02 DIAGNOSIS — Z9181 History of falling: Secondary | ICD-10-CM

## 2018-01-02 NOTE — Therapy (Signed)
[facility] [address] [place], [address] Phone: [phone]   Fax:  [phone]  Physical Therapy Discharge Summary   Patient Details  Name: ANNIE SAEPHAN MRN: [id number] Date of Birth: 04-Oct-1940 Referring Provider: Cora Daniels, MD   Encounter Date: 01/02/2018    Past Medical History:  Diagnosis Date  . Anxiety   . Depression   . GERD (gastroesophageal reflux disease)   . Hypertension     Past Surgical History:  Procedure Laterality Date  . APPENDECTOMY    . PARATHYROIDECTOMY      There were no vitals filed for this visit.                                 PT Long Term Goals - 12/03/17 1855      PT LONG TERM GOAL #1   Title  Patient will improve her Merrilee Jansky Balance test score to at least 46/56 as a demonstration of improved balance, and decreased fall risk.     Baseline  37/56 (09/10/2017); 40/56 (10/21/2017); Unable to assess (12/03/2017)    Time  6    Period  Weeks    Status  Partially Met    Target Date  12/05/17      PT LONG TERM GOAL #2   Title  Patient will improve bilateral hip strength by at least 1/2 MMT to promote ability to ambulate, and perform standing tasks.     Time  6    Period  Weeks    Status  Unable to assess    Target Date  12/05/17      PT LONG TERM GOAL #3   Title  Pt will improve her FOTO score to 53 or more as a demonstration of improved function.     Baseline  46 (09/10/2017); 47 (10/02/2017)    Time  6    Period  Weeks    Status  Unable to assess    Target Date  12/05/17      PT LONG TERM GOAL #4   Title  Patient will improve her LEFS score by at least 9 points as a demonstration of improved function.     Baseline  46/80 (09/10/2017); 48/80 (10/02/2017)    Time  6    Period  Weeks    Status  Unable to assess    Target Date  12/05/17            Plan - 01/02/18 1921    Clinical Impression Statement  Patient has been  participating in physical therapy to promote strengthening and improve balance. However, due to the increase in fatigue (more than usual), lightheadedness, and reports of double vision in May 2019 with addition of her face feeling increased warmth following one of the Berg balance activities, physical therapy was paused until cleared by MD to rule out any underlying factors outside of physical therapy that might be involved. Blood pressure, HR, and SpO2 were within normal limits during her most recent session. Multiple attempts were made to contact patient to see how she is doing but unable to reach patient or leave a voicemail secondary to mailbox being full. Skilled physical therapy services discharged.     PT Treatment/Interventions  Aquatic Therapy;Gait training;Therapeutic activities;Therapeutic exercise;Balance training;Neuromuscular re-education;Patient/family education;Manual techniques traction if appropriate       Patient will benefit from skilled therapeutic intervention in order to improve the  following deficits and impairments:  Postural dysfunction, Pain, Improper body mechanics, Difficulty walking, Decreased strength, Decreased endurance, Decreased balance, Decreased activity tolerance  Visit Diagnosis: Difficulty in walking, not elsewhere classified  History of falling  Muscle weakness (generalized)  Unsteadiness on feet     Problem List Patient Active Problem List   Diagnosis Date Noted  . Acute bronchitis 10/06/2016    Thank you for your referral.  Joneen Boers PT, DPT   01/02/2018, 7:23 PM  [facility] [address], [place], [address] Phone: [phone]   Fax:  [phone]  Name: ALITA WALDREN MRN: [id number] Date of Birth: March 21, 1941

## 2018-01-02 NOTE — Telephone Encounter (Signed)
Tried calling patient to check up to see how she is doing. No one answered the phone. Unable to leave a message secondary to the mailbox being full.

## 2018-04-01 ENCOUNTER — Ambulatory Visit: Payer: Medicare Other | Attending: Geriatric Medicine

## 2018-04-03 ENCOUNTER — Ambulatory Visit: Payer: Medicare Other

## 2018-04-07 ENCOUNTER — Ambulatory Visit: Payer: Medicare Other

## 2018-04-09 ENCOUNTER — Ambulatory Visit: Payer: Medicare Other

## 2018-04-30 ENCOUNTER — Ambulatory Visit: Payer: Medicare Other | Attending: Geriatric Medicine

## 2018-04-30 DIAGNOSIS — M545 Low back pain: Secondary | ICD-10-CM | POA: Diagnosis present

## 2018-04-30 DIAGNOSIS — Z9181 History of falling: Secondary | ICD-10-CM | POA: Diagnosis present

## 2018-04-30 DIAGNOSIS — R2681 Unsteadiness on feet: Secondary | ICD-10-CM

## 2018-04-30 DIAGNOSIS — M6281 Muscle weakness (generalized): Secondary | ICD-10-CM

## 2018-04-30 DIAGNOSIS — R262 Difficulty in walking, not elsewhere classified: Secondary | ICD-10-CM

## 2018-04-30 DIAGNOSIS — G8929 Other chronic pain: Secondary | ICD-10-CM | POA: Diagnosis present

## 2018-04-30 NOTE — Therapy (Signed)
[place] [facility]INE [address] [place], [address] Phone: [phone]   Fax:  [phone]  Physical Therapy Evaluation  Patient Details  Name: Kara Pennington MRN: [id number] Date of Birth: [birth year] Referring Provider (PT): Romeo Apple A. Cora Daniels, MD   Encounter Date: 04/30/2018  PT End of Session - 04/30/18 1438    Visit Number  1    Number of Visits  17    Date for PT Re-Evaluation  06/26/18    Authorization Type  1    Authorization Time Period  of 10 progress note    PT Start Time  1438    PT Stop Time  1548    PT Time Calculation (min)  70 min    Equipment Utilized During Treatment  Gait belt    Activity Tolerance  Patient tolerated treatment well    Behavior During Therapy  WFL for tasks assessed/performed       Past Medical History:  Diagnosis Date  . Anxiety   . Depression   . GERD (gastroesophageal reflux disease)   . Hypertension     Past Surgical History:  Procedure Laterality Date  . APPENDECTOMY    . PARATHYROIDECTOMY      There were no vitals filed for this visit.   Subjective Assessment - 04/30/18 1441    Subjective  Back pain: 4/10 currently, and at worst for the past 3 months.     Pertinent History  Gait instability. Doctor took her off her blood pressure medicine and has not had the light headedness since. Balance feels better. Has moved to a Senior Living center [facility]).  Gets 3 meals a day.  Was diagnosed with malnutrition and dehydration in July 2019 because she was not eating before. Currently working on her diet.  Has been eating protein and salads. Currently on elequis. Forgets to take it in the morning.  Last fall was January 09, 2018. Pt tripped over the threshold when entering her appartment.  Pt was taking a buggy and a carton of Coke and lost balance. No falls since then.  Found a new home for her golden retriever mix dog.   Dizziness is gone.  Energy levels are not the best but  pt was alone at the time she was alone during last PT bout. Now pt has friends.  Pt loves the social interactions.  Pt also states having disc problems arond L3 and L 4.  Going [place], [place].   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 No activity restriction per pt.     Patient Stated Goals  Walk better, have more energy.     Currently in Pain?  Yes    Pain Score  4     Pain Location  Back    Pain Orientation  Posterior    Pain Type  Chronic pain    Pain Onset  More than a month ago    Pain Frequency  Occasional    Aggravating Factors   Sitting about 1.5 hours, picking up a laundry basket full of clothes, standing 3-4 minutes    Pain Relieving Factors  standing up straight when walking. leaning back against a door frame.          [facility] PT Assessment - 04/30/18 1440      Assessment   Medical Diagnosis  Gait instability  Referring Provider (PT)  Romeo Apple A. Cora Daniels, MD    Onset Date/Surgical Date  03/08/18   date PT referral signed. Chronic condition   Prior Therapy  Pt participated in PT a few months ago for gait and balance      Precautions   Precaution Comments  fall risk      Restrictions   Other Position/Activity Restrictions  no known weight bearing restrictions      Balance Screen   Has the patient fallen in the past 6 months  Yes    How many times?  1   at least 1x   Has the patient had a decrease in activity level because of a fear of falling?   No   but pt very carful   Is the patient reluctant to leave their home because of a fear of falling?   No   but pt very careful     Home Environment   Additional Comments  Pt lives in an independent living facility. 2nd floor 13-14 steps to get to 2nd floor B rails. Has an elevator. No steps to enter.       Prior Function   Vocation  Retired    Gaffer  PLOF: decreased falls      Observation/Other  Assessments   Focus on Therapeutic Outcomes (FOTO)   Lower leg FOTO 44    Lower Extremity Functional Scale   32/80      Posture/Postural Control   Posture Comments  B protracted neck and shoulders, decreased lumbar lordosis, decreased B hip extension,       AROM   Lumbar Flexion  WFL    Lumbar Extension  limited    Lumbar - Right Side [facility] with R low back side bend    Lumbar - Left Side Bend  limited with R low back discomfort    Lumbar - Right Rotation  limited   performed in sitting.    Lumbar - Left Rotation  WFL with R lumbar siffness felt.    performed in sitting.      Strength   Right Hip Flexion  4-/5    Right Hip Extension  4/5   seated manually resisted   Right Hip ABduction  4/5   seated manually resisted clamshell isometric   Left Hip Flexion  4-/5    Left Hip Extension  4/5   seated manually resisted   Left Hip ABduction  4/5   seated manually resisted clamshell isometric   Right Knee Flexion  4/5    Right Knee Extension  4+/5    Left Knee Flexion  4/5    Left Knee Extension  5/5      Ambulation/Gait   Gait Comments  decreased stance R LE, bilateral pelvic drop and lateral lean, anterior pelvic rotation with swing phase of gait.       Dynamic Gait Index   Level Surface  Mild Impairment    Change in Gait Speed  Mild Impairment    Gait with Horizontal Head Turns  Mild Impairment    Gait with Vertical Head Turns  Mild Impairment   felt a little off balance looking up and walking   Gait and Pivot Turn  Mild Impairment    Step Over Obstacle  Mild Impairment    Step Around Obstacles  Normal    Steps  Mild Impairment    Total Score  17    DGI comment:  Less than 19/24 suggests increased fall risk.  Objective measurements completed on examination: See above findings.   Cleared to resume PT per MD referral   No latex band allergies  Medbridge Access Code: [email or web address]    Therapeutic exercise  Sitting with lumbar towel roll. No  change in back pain  Blood pressure L arm sitting, mechanically taken, normal cuff: 158/77, HR 70  standing B scapular retraction resisting yellow band to promote thoracic extension to help with cervical extension 10x3 with 5 second holds  Side stepping 5 ft to the R and 5 ft to the L to promote glute med muscle strengthening   Reviewed HEP. Pt demonstrated and verbalized understanding. Handout provided.   Work on transversus abdominis contraction and check hip IR at 90/90 next visit if appropriate.    Improved exercise technique, movement at target joints, use of target muscles after min to mod verbal, visual, tactile cues.   Patient is a 77 year old female who returns to PT for gait instability. She also presents with altered gait pattern and posture, B hip weakness, hx of a fall, Dynamic Gait index score of 17/24 suggesting increased fall risk, back pain and difficulty performing functional tasks such as doing laundry, and maintaining positions such as standing. Patient will benefit from skilled physical therapy services to address the aforementioned deficits.       PT Education - 04/30/18 2014    Education Details  ther-ex. HEP, plan of care    Person(s) Educated  Patient    Methods  Explanation;Demonstration;Tactile cues;Verbal cues;Handout    Comprehension  Returned demonstration;Verbalized understanding       PT Short Term Goals - 04/30/18 1952      PT SHORT TERM GOAL #1   Title  Patient will be independent with her HEP to improve balance and decrease back pain.     Baseline  Patient has started her HEP (04/30/2018)    Time  3    Period  Weeks    Status  New    Target Date  05/22/18        PT Long Term Goals - 04/30/18 1953      PT LONG TERM GOAL #1   Title  Patient will improve her DGI score to 19/24 or greater as a demonstration of decreased fall risk.     Baseline  17/24 (04/30/2018)     Time  8    Period  Weeks    Status  New    Target Date  06/26/18       PT LONG TERM GOAL #2   Title  Patient will improve B glute med and max strength by at least 1/2 MMT grade to promote balance as well as improve ability to perform standing tasks with less back pain.     Time  8    Period  Weeks    Status  New    Target Date  06/26/18      PT LONG TERM GOAL #3   Title  Patient will have a decrease in back pain to 2/10 or less at most to promote ability to perform standing tasks.     Baseline  4/10 back pain at most for the past 3 months (04/30/2018)    Time  8    Period  Weeks    Status  New    Target Date  06/26/18      PT LONG TERM GOAL #4   Title  Patient will improve her lower leg FOTO score by at least 10 points  as a demonstration of improved function.     Baseline  Lower leg FOTO 44 (04/30/2018)    Time  8    Period  Weeks    Status  New    Target Date  06/26/18      PT LONG TERM GOAL #5   Title  Patient will improve her LEFS score by at least 10 points as a demonstration of improved function.     Baseline  32/80 (04/30/2018)    Time  8    Period  Weeks    Status  New    Target Date  06/26/18             Plan - 04/30/18 1556    Clinical Impression Statement  Patient is a 77 year old female who returns to PT for gait instability. She also presents with altered gait pattern and posture, B hip weakness, hx of a fall, Dynamic Gait index score of 17/24 suggesting increased fall risk, back pain and difficulty performing functional tasks such as doing laundry, and maintaining positions such as standing. Patient will benefit from skilled physical therapy services to address the aforementioned deficits.     History and Personal Factors relevant to plan of care:  Chronicity of condition, back pain, weakness, hx of a fall.     Clinical Presentation  Stable    Clinical Presentation due to:  pt states light headedness and dizziness gone    Clinical Decision Making  Low    Rehab Potential  Fair    Clinical Impairments Affecting Rehab Potential   (-) Chronicity of condition, age; (+) motivated     PT Frequency  2x / week    PT Duration  8 weeks    PT Treatment/Interventions  Therapeutic activities;Therapeutic exercise;Neuromuscular re-education;Aquatic Therapy;Electrical Stimulation;Iontophoresis 4mg /ml Dexamethasone;Traction;Gait training;Balance training;Patient/family education;Manual techniques;Dry needling;Ultrasound   traction if appropriate   PT Next Visit Plan  scapular strengthening, thoracic extension, hip and trunk strengthening, balance, manual techniques, modalities PRN    Consulted and Agree with Plan of Care  Patient       Patient will benefit from skilled therapeutic intervention in order to improve the following deficits and impairments:  Pain, Postural dysfunction, Improper body mechanics, Difficulty walking, Decreased strength  Visit Diagnosis: History of falling - Plan: PT plan of care cert/re-cert  Unsteadiness on feet - Plan: PT plan of care cert/re-cert  Muscle weakness (generalized) - Plan: PT plan of care cert/re-cert  Difficulty in walking, not elsewhere classified - Plan: PT plan of care cert/re-cert  Chronic low back pain, unspecified back pain laterality, unspecified whether sciatica present - Plan: PT plan of care cert/re-cert     Problem List Patient Active Problem List   Diagnosis Date Noted  . Acute bronchitis 10/06/2016    Loralyn Freshwater PT, DPT   04/30/2018, 8:21 PM  [place] [facility] PHYS[facility] AND SPORTS MEDICINE [address] [place], [address] Phone: [phone]   Fax:  [phone]  Name: Kara Pennington MRN: [id number] Date of Birth: 1940-10-25

## 2018-04-30 NOTE — Patient Instructions (Addendum)
  Medbridge Access Code: [id number]    Scapular Retraction with Resistance  10x3 with 5 second holds yellow band  Side Stepping with Counter Support  2x5 repetitions (5 ft to the R and 5 ft to the L)

## 2018-05-03 IMAGING — CT CT ANGIO CHEST
2 of 6 series · 18 of 46 positions shown · IV contrast (APPLIED)
Comparison: None.

CLINICAL DATA: Worsening bronchitis

EXAM:
CT ANGIOGRAPHY CHEST WITH CONTRAST
TECHNIQUE: Multidetector CT imaging of the chest was performed using the
standard protocol during bolus administration of intravenous
contrast. Multiplanar CT image reconstructions and MIPs were
obtained to evaluate the vascular anatomy.
CONTRAST:  75 cc Isovue 370 IV

[Series 5: thins · axial · 0.71mm/px · z∈[-725,-477]mm · 16 of 272 slices shown]
[im 12/272  lung]
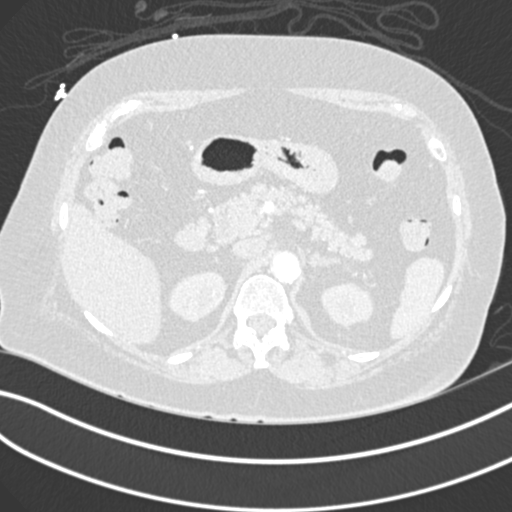
[im 36/272  soft-tissue]
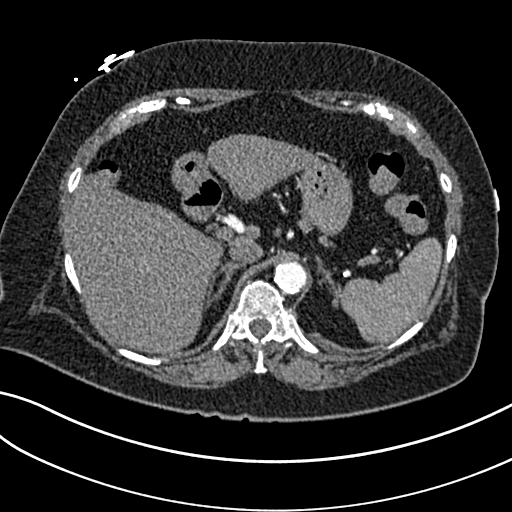
[im 48/272  lung]
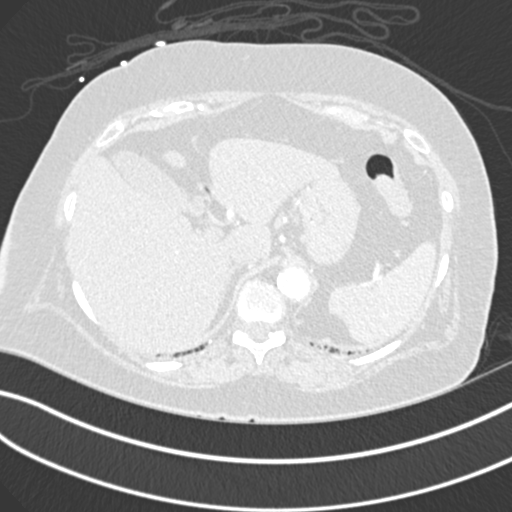
[im 59/272  soft-tissue]
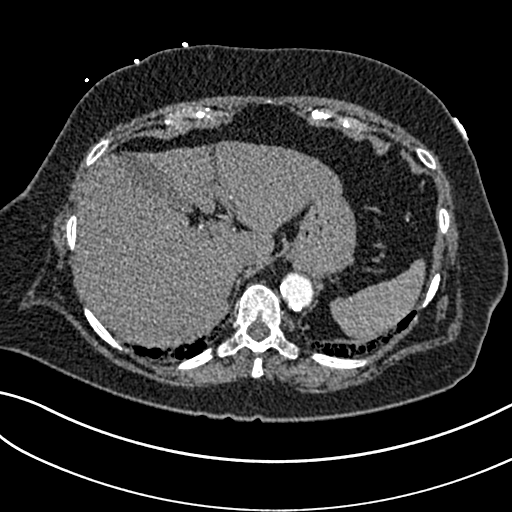
[im 83/272  lung]
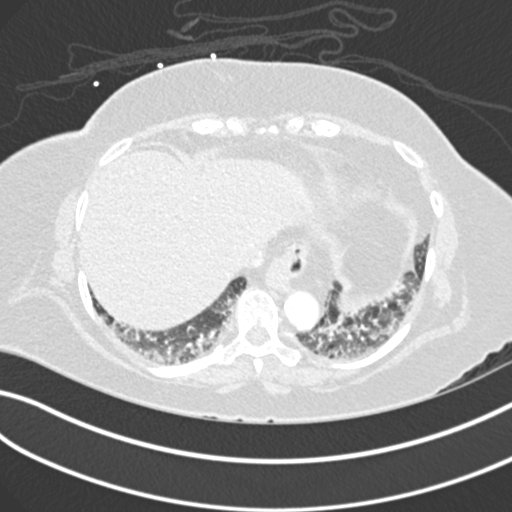
[im 95/272  soft-tissue]
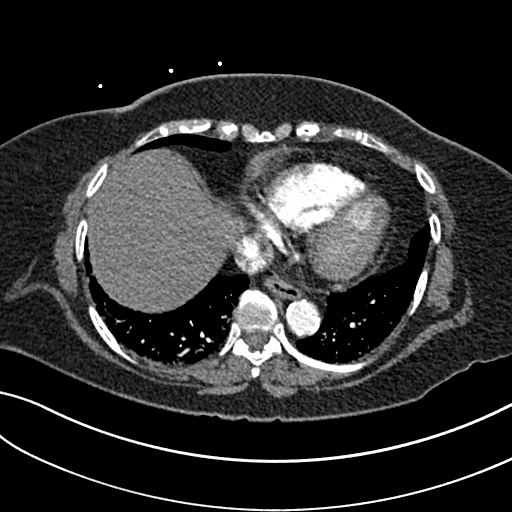
[im 107/272  lung]
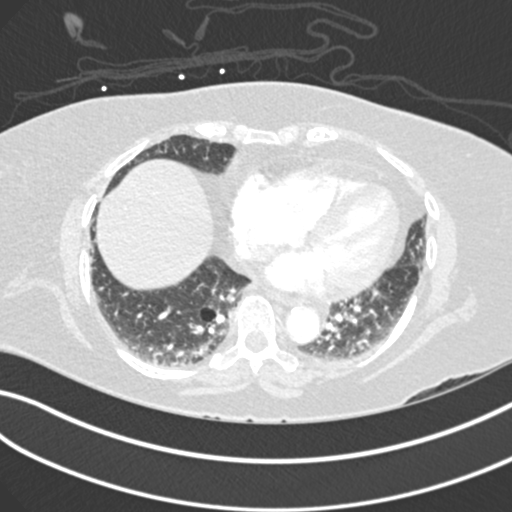
[im 130/272  soft-tissue]
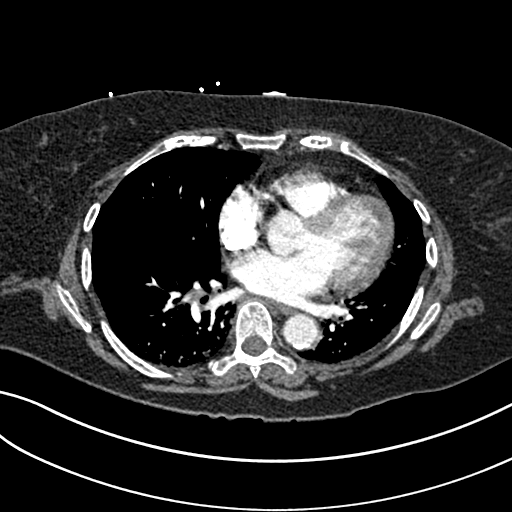
[im 142/272  lung]
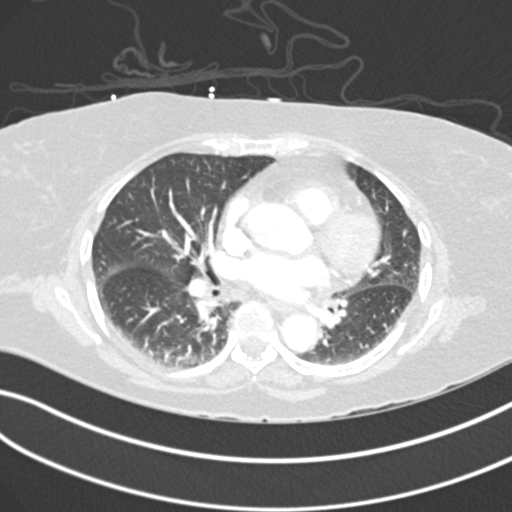
[im 165/272  soft-tissue]
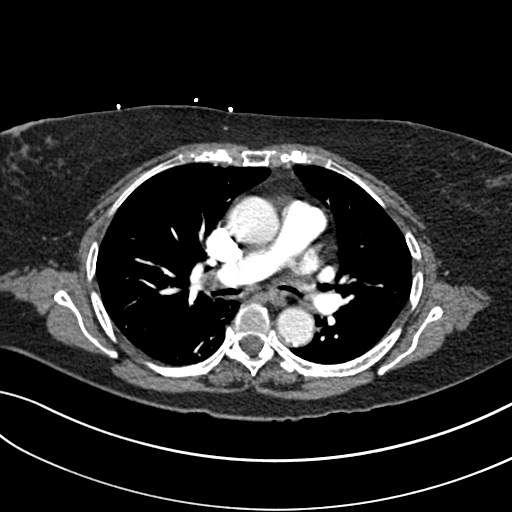
[im 177/272  lung]
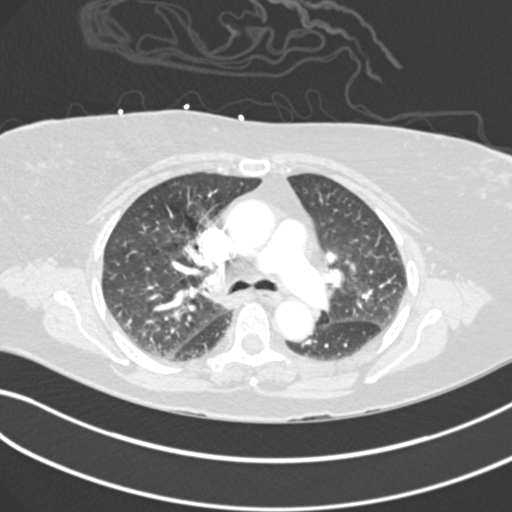
[im 189/272  soft-tissue]
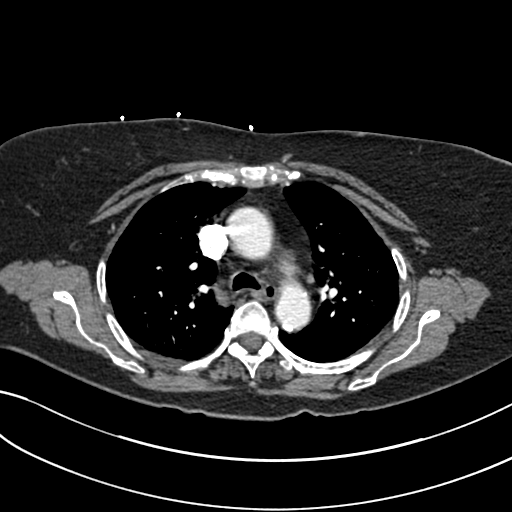
[im 213/272  lung]
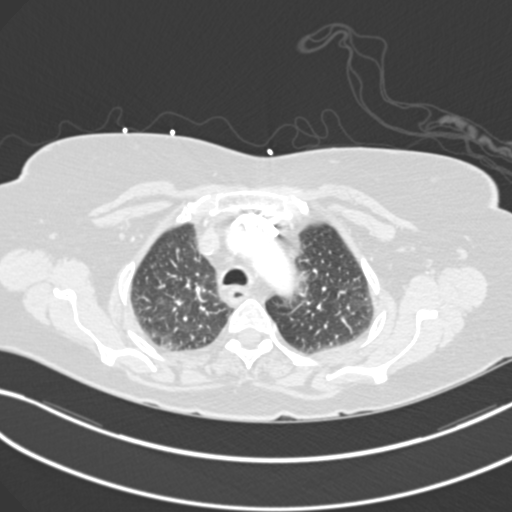
[im 224/272  soft-tissue]
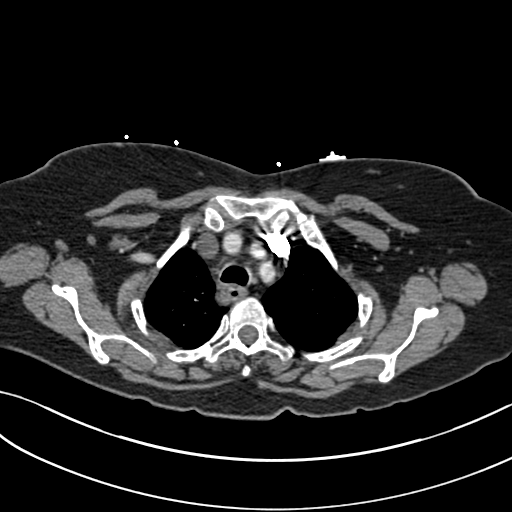
[im 236/272  lung]
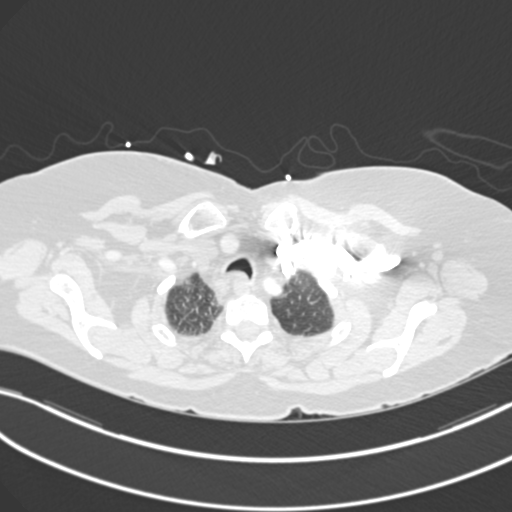
[im 260/272  soft-tissue]
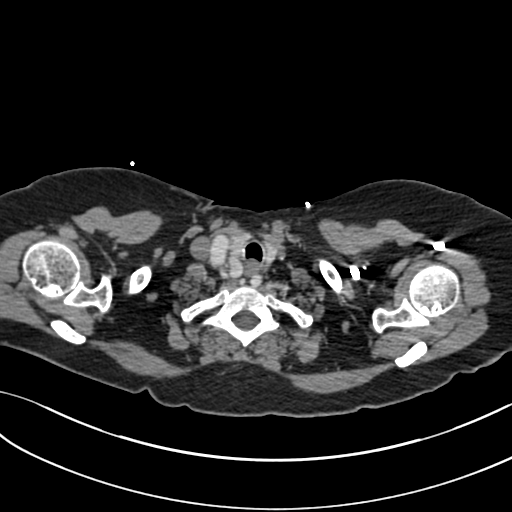

[Series 7: coronal mpr · coronal · 0.53mm/px · 2 of 77 slices shown]
[im 26/77  soft-tissue]
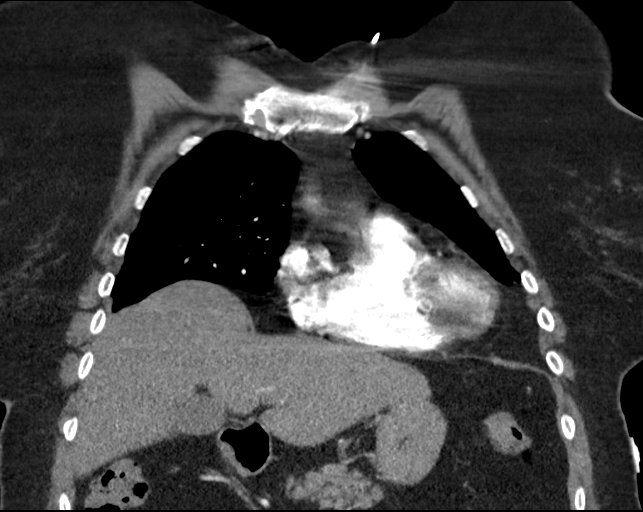
[im 51/77  soft-tissue]
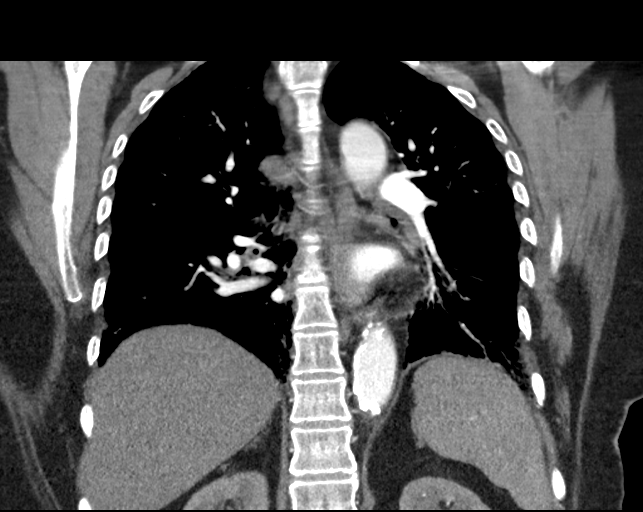

[18 of 46 positions shown; findings below may reference images not displayed]

FINDINGS: Cardiovascular: Normal branch pattern of the great vessels. Aortic
atherosclerosis without dissection. No pulmonary embolus. The
cardiac chambers are within normal limits for size. Trace anterior
pericardial effusion or thickening.

Mediastinum/Nodes: Small hiatal hernia. No adenopathy. Patent
trachea and mainstem bronchi. No thyromegaly or mass. The esophagus
is grossly unremarkable.

Lungs/Pleura: 3 mm right upper lobe subpleural nodule, series 6,
image 27. Dependent atelectasis bilaterally. No pneumonic
consolidation. Tiny blebs and bulla at the lung bases. Mild
interstitial prominence with peribronchial thickening consistent
with bronchitic change.

Upper Abdomen: Calcification along the anterior aspect of the spleen
consistent with a granuloma. No adrenal mass. The visualized
kidneys, pancreas and liver are unremarkable. The gallbladder is
nondistended and free of stones. No acute bowel inflammation or
obstruction.

Musculoskeletal: No chest wall abnormality. No acute or significant
osseous findings.

Review of the MIP images confirms the above findings.
IMPRESSION: 1. 3 mm right upper lobe subpleural pulmonary nodule. No follow-up
needed if patient is low-risk. Non-contrast chest CT can be
considered in 12 months if patient is high-risk. This recommendation
follows the consensus statement: Guidelines for Management of
Incidental Pulmonary Nodules Detected on CT Images: From the
2. Dependent atelectasis and mild bronchitic change of the lungs. No
alveolar consolidation or pneumothorax.
3. Aortic atherosclerosis.
4. Small hiatal hernia.

## 2018-05-06 ENCOUNTER — Telehealth: Payer: Self-pay

## 2018-05-06 ENCOUNTER — Ambulatory Visit: Payer: Medicare Other

## 2018-05-06 NOTE — Telephone Encounter (Signed)
No show. Called patient with phone number provided. Wrong number. Unable to call patient.

## 2018-05-08 ENCOUNTER — Ambulatory Visit: Payer: Medicare Other

## 2018-05-08 DIAGNOSIS — M6281 Muscle weakness (generalized): Secondary | ICD-10-CM

## 2018-05-08 DIAGNOSIS — Z9181 History of falling: Secondary | ICD-10-CM | POA: Diagnosis not present

## 2018-05-08 DIAGNOSIS — M545 Low back pain: Secondary | ICD-10-CM

## 2018-05-08 DIAGNOSIS — R2681 Unsteadiness on feet: Secondary | ICD-10-CM

## 2018-05-08 DIAGNOSIS — R262 Difficulty in walking, not elsewhere classified: Secondary | ICD-10-CM

## 2018-05-08 DIAGNOSIS — G8929 Other chronic pain: Secondary | ICD-10-CM

## 2018-05-08 NOTE — Therapy (Signed)
[place] [facility]ICINE [address], [place], [address] Phone: [phone]   Fax:  [phone]  Physical Therapy Treatment  Patient Details  Name: Kara Pennington MRN: [id number] Date of Birth: October 05, 1940 Referring Provider (PT): Romeo Apple A. Cora Daniels, MD   Encounter Date: 05/08/2018  PT End of Session - 05/08/18 1303    Visit Number  2    Number of Visits  17    Date for PT Re-Evaluation  06/26/18    Authorization Type  2    Authorization Time Period  of 10 progress note    PT Start Time  1303    PT Stop Time  1344    PT Time Calculation (min)  41 min    Equipment Utilized During Treatment  Gait belt    Activity Tolerance  Patient tolerated treatment well    Behavior During Therapy  WFL for tasks assessed/performed       Past Medical History:  Diagnosis Date  . Anxiety   . Depression   . GERD (gastroesophageal reflux disease)   . Hypertension     Past Surgical History:  Procedure Laterality Date  . APPENDECTOMY    . PARATHYROIDECTOMY      There were no vitals filed for this visit.  Subjective Assessment - 05/08/18 1305    Subjective  Back has been hurting sort of. Used her rw a couple of days. Back bothers her when she first wakes up. Back pain improves as the day progresses. 2/10 back ache currently.     Pertinent History  Gait instability. Doctor took her off her blood pressure medicine and has not had the light headedness since. Balance feels better. Has moved to a Senior Living center [facility]).  Gets 3 meals a day.  Was diagnosed with malnutrition and dehydration in July 2019 because she was not eating before. Currently working on her diet.  Has been eating protein and salads. Currently on elequis. Forgets to take it in the morning.  Last fall was January 09, 2018. Pt tripped over the threshold when entering her appartment.  Pt was taking a buggy and a carton of Coke and lost balance. No falls since then.   Found a new home for her golden retriever mix dog.   Dizziness is gone.  Energy levels are not the best but pt was alone at the time she was alone during last PT bout. Now pt has friends.  Pt loves the social interactions.  Pt also states having disc problems arond L3 and L 4.  Going [place], [place].   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 No activity restriction per pt.     Patient Stated Goals  Walk better, have more energy.     Currently in Pain?  Yes    Pain Score  2     Pain Location  Back    Pain Descriptors / Indicators  Aching    Pain Onset  More than a month ago         [facility] PT Assessment - 05/08/18 1308      PROM   Right Hip Internal Rotation   30   AAROM with hip pain   Left Hip Internal Rotation   38   AAROM  PT Education - 05/08/18 1326    Education Details  ther-ex    Person(s) Educated  Patient    Methods  Explanation;Demonstration;Tactile cues;Verbal cues    Comprehension  Returned demonstration;Verbalized understanding      Objective   New number : [phone]  Cleared to resume PT per MD referral  No latex band allergies  Medbridge Access Code: [facility]    Therapeutic exercise  Seated hip at 90/90 IR  R 30 degrees  L 38 degrees  Seated R hip IR PROM 10x  Then AROM 10x3   Forward step up onto Air Ex pad with on e UE assist   R 10x  L 10x   Sit <> stand from regular chair, emphasis on femoral control and not flopping down 5x2  Pt states back bothers her when she flops down  No back pain with transfer with improved form   Side stepping 32 ft to the R and 32 ft to the L CGA to SBA to promote glute med muscle strengthening.   Then 15 ft to the R and 15 ft to the L CGA to SBA   standing B scapular retraction resisting yellow band to promote thoracic extension to help with cervical  extension 10x3 with 5 second holds  Standing alternating toe taps onto treadmill platform with light touch assist 10x2 each LE   Improved exercise technique, movement at target joints, use of target muscles after min to mod verbal, visual, tactile cues.   Worked on R hip IR to promote mobility, thoracic extension to promote cervical extension and decrease low back extension pressure, and glute med strengthening to help decrease lateral lean during gait, and toe taps to promote balance. No back pain in sitting after session. Pt will benefit from continued skilled physical therapy services to decrease back pain, improve balance, and decrease fall risk.              PT Short Term Goals - 04/30/18 1952      PT SHORT TERM GOAL #1   Title  Patient will be independent with her HEP to improve balance and decrease back pain.     Baseline  Patient has started her HEP (04/30/2018)    Time  3    Period  Weeks    Status  New    Target Date  05/22/18        PT Long Term Goals - 04/30/18 1953      PT LONG TERM GOAL #1   Title  Patient will improve her DGI score to 19/24 or greater as a demonstration of decreased fall risk.     Baseline  17/24 (04/30/2018)     Time  8    Period  Weeks    Status  New    Target Date  06/26/18      PT LONG TERM GOAL #2   Title  Patient will improve B glute med and max strength by at least 1/2 MMT grade to promote balance as well as improve ability to perform standing tasks with less back pain.     Time  8    Period  Weeks    Status  New    Target Date  06/26/18      PT LONG TERM GOAL #3   Title  Patient will have a decrease in back pain to 2/10 or less at most to promote ability to perform standing tasks.     Baseline  4/10 back pain at most for  the past 3 months (04/30/2018)    Time  8    Period  Weeks    Status  New    Target Date  06/26/18      PT LONG TERM GOAL #4   Title  Patient will improve her lower leg FOTO score by at least 10 points  as a demonstration of improved function.     Baseline  Lower leg FOTO 44 (04/30/2018)    Time  8    Period  Weeks    Status  New    Target Date  06/26/18      PT LONG TERM GOAL #5   Title  Patient will improve her LEFS score by at least 10 points as a demonstration of improved function.     Baseline  32/80 (04/30/2018)    Time  8    Period  Weeks    Status  New    Target Date  06/26/18            Plan - 05/08/18 1340    Clinical Impression Statement  Worked on R hip IR to promote mobility, thoracic extension to promote cervical extension and decrease low back extension pressure, and glute med strengthening to help decrease lateral lean during gait, and toe taps to promote balance. No back pain in sitting after session. Pt will benefit from continued skilled physical therapy services to decrease back pain, improve balance, and decrease fall risk.      Rehab Potential  Fair    Clinical Impairments Affecting Rehab Potential  (-) Chronicity of condition, age; (+) motivated     PT Frequency  2x / week    PT Duration  8 weeks    PT Treatment/Interventions  Therapeutic activities;Therapeutic exercise;Neuromuscular re-education;Aquatic Therapy;Electrical Stimulation;Iontophoresis 4mg /ml Dexamethasone;Traction;Gait training;Balance training;Patient/family education;Manual techniques;Dry needling;Ultrasound   traction if appropriate   PT Next Visit Plan  scapular strengthening, thoracic extension, hip and trunk strengthening, balance, manual techniques, modalities PRN    Consulted and Agree with Plan of Care  Patient       Patient will benefit from skilled therapeutic intervention in order to improve the following deficits and impairments:  Pain, Postural dysfunction, Improper body mechanics, Difficulty walking, Decreased strength  Visit Diagnosis: History of falling  Unsteadiness on feet  Muscle weakness (generalized)  Difficulty in walking, not elsewhere classified  Chronic low  back pain, unspecified back pain laterality, unspecified whether sciatica present     Problem List Patient Active Problem List   Diagnosis Date Noted  . Acute bronchitis 10/06/2016    Loralyn Freshwater PT, DPT   05/08/2018, 1:54 PM  [facility] PHYSICAL AND SPORTS MEDICINE [address], [place], [address] Phone: [phone]   Fax:  [phone]  Name: Kara Pennington MRN: [id number] Date of Birth: 07-Sep-1940

## 2018-05-13 ENCOUNTER — Ambulatory Visit: Payer: Medicare Other | Attending: Geriatric Medicine

## 2018-05-13 DIAGNOSIS — R2681 Unsteadiness on feet: Secondary | ICD-10-CM | POA: Diagnosis present

## 2018-05-13 DIAGNOSIS — M545 Low back pain: Secondary | ICD-10-CM | POA: Insufficient documentation

## 2018-05-13 DIAGNOSIS — R262 Difficulty in walking, not elsewhere classified: Secondary | ICD-10-CM

## 2018-05-13 DIAGNOSIS — Z9181 History of falling: Secondary | ICD-10-CM

## 2018-05-13 DIAGNOSIS — G8929 Other chronic pain: Secondary | ICD-10-CM | POA: Diagnosis present

## 2018-05-13 DIAGNOSIS — M6281 Muscle weakness (generalized): Secondary | ICD-10-CM | POA: Diagnosis present

## 2018-05-13 NOTE — Therapy (Signed)
[facility] [address], [place], [address] Phone: [phone]   Fax:  [phone]  Physical Therapy Treatment  Patient Details  Name: Kara Pennington MRN: [id number] Date of Birth: 1940-12-09 Referring Provider (PT): Romeo Apple A. Cora Daniels, MD   Encounter Date: 05/13/2018  PT End of Session - 05/13/18 1306    Visit Number  3    Number of Visits  17    Date for PT Re-Evaluation  06/26/18    Authorization Type  3    Authorization Time Period  of 10 progress note    PT Start Time  1306    PT Stop Time  1347    PT Time Calculation (min)  41 min    Equipment Utilized During Treatment  Gait belt    Activity Tolerance  Patient tolerated treatment well    Behavior During Therapy  WFL for tasks assessed/performed       Past Medical History:  Diagnosis Date  . Anxiety   . Depression   . GERD (gastroesophageal reflux disease)   . Hypertension     Past Surgical History:  Procedure Laterality Date  . APPENDECTOMY    . PARATHYROIDECTOMY      There were no vitals filed for this visit.  Subjective Assessment - 05/13/18 1307    Subjective  Strained a muscle in her back Sunday (medial to R inferior scapular angle). Feels better today. Picked up a wooden Scrabble game and might have twisted the wrong way.   No back pain currently.     Pertinent History  Gait instability. Doctor took her off her blood pressure medicine and has not had the light headedness since. Balance feels better. Has moved to a [facility] center [facility]).  Gets 3 meals a day.  Was diagnosed with malnutrition and dehydration in July 2019 because she was not eating before. Currently working on her diet.  Has been eating protein and salads. Currently on elequis. Forgets to take it in the morning.  Last fall was January 09, 2018. Pt tripped over the threshold when entering her appartment.  Pt was taking a buggy and a carton of Coke and lost balance. No  falls since then.  Found a new home for her golden retriever mix dog.   Dizziness is gone.  Energy levels are not the best but pt was alone at the time she was alone during last PT bout. Now pt has friends.  Pt loves the social interactions.  Pt also states having disc problems arond L3 and L 4.  Going [place], [place].   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 No activity restriction per pt.     Patient Stated Goals  Walk better, have more energy.     Currently in Pain?  No/denies    Pain Onset  More than a month ago                               PT Education - 05/13/18 1448    Education Details  ther-ex    Person(s) Educated  Patient    Methods  Explanation;Demonstration;Tactile cues;Verbal cues    Comprehension  Returned demonstration;Verbalized understanding      Objective     Cleared to resume PTper MD referral No latex  band allergies  MedbridgeAccess Code: [email or web address]   Therapeutic exercise  Seated trunk rotation R and L. No pain with over pressure Seated trunk flexion: reproduced R inferior scapular pain Seated trunk extension, no pain  Reviewed ergonomic lifting and turning with pt to help prevent future upper back pain   Standing B shoulder extension with scapular retraction resisting red band 10x  Then 10x5 seconds for 2 sets   Sit <> stand from chair with arms with 1 pillow 5x, no UE assist to promote LE strength  Then no pillow 5x, no UE assist  Stepping over 2 fallen canes without UE assist 3x each side, CGA  Lateral stepping over 2 fallen canes without UE assist 3x each side, CGA  Gait with R and L head turns  32 ft 2x  Decreased balance with walking while head is turned to the L  Improved exercise technique, movement at target joints, use of target muscles after min to mod verbal, visual, tactile cues.    Manual  therapy  Seated STM R upper trap muscle area to help promote L cervical rotation while walking    Demonstrates increased R and L lateral lean with walking while maintaining L cervical rotation. Worked on STM to R upper trap muscle to help address but no significant change noticed with walking afterwards. Pt also demonstrates improved LE strength with pt able to stand up from a regular chair today without UE assist. Able to step over 2 fallen canes both forward and sideways without UE assist and no LOB. Pt will benefit from continued skilled physical therapy services to improve LE strength, balance, and decrease fall risk.            PT Short Term Goals - 04/30/18 1952      PT SHORT TERM GOAL #1   Title  Patient will be independent with her HEP to improve balance and decrease back pain.     Baseline  Patient has started her HEP (04/30/2018)    Time  3    Period  Weeks    Status  New    Target Date  05/22/18        PT Long Term Goals - 04/30/18 1953      PT LONG TERM GOAL #1   Title  Patient will improve her DGI score to 19/24 or greater as a demonstration of decreased fall risk.     Baseline  17/24 (04/30/2018)     Time  8    Period  Weeks    Status  New    Target Date  06/26/18      PT LONG TERM GOAL #2   Title  Patient will improve B glute med and max strength by at least 1/2 MMT grade to promote balance as well as improve ability to perform standing tasks with less back pain.     Time  8    Period  Weeks    Status  New    Target Date  06/26/18      PT LONG TERM GOAL #3   Title  Patient will have a decrease in back pain to 2/10 or less at most to promote ability to perform standing tasks.     Baseline  4/10 back pain at most for the past 3 months (04/30/2018)    Time  8    Period  Weeks    Status  New    Target Date  06/26/18      PT LONG TERM  GOAL #4   Title  Patient will improve her lower leg FOTO score by at least 10 points as a demonstration of improved  function.     Baseline  Lower leg FOTO 44 (04/30/2018)    Time  8    Period  Weeks    Status  New    Target Date  06/26/18      PT LONG TERM GOAL #5   Title  Patient will improve her LEFS score by at least 10 points as a demonstration of improved function.     Baseline  32/80 (04/30/2018)    Time  8    Period  Weeks    Status  New    Target Date  06/26/18            Plan - 05/13/18 1448    Clinical Impression Statement  Demonstrates increased R and L lateral lean with walking while maintaining L cervical rotation. Worked on STM to R upper trap muscle to help address but no significant change noticed with walking afterwards. Pt also demonstrates improved LE strength with pt able to stand up from a regular chair today without UE assist. Able to step over 2 fallen canes both forward and sideways without UE assist and no LOB. Pt will benefit from continued skilled physical therapy services to improve LE strength, balance, and decrease fall risk.     Rehab Potential  Fair    Clinical Impairments Affecting Rehab Potential  (-) Chronicity of condition, age; (+) motivated     PT Frequency  2x / week    PT Duration  8 weeks    PT Treatment/Interventions  Therapeutic activities;Therapeutic exercise;Neuromuscular re-education;Aquatic Therapy;Electrical Stimulation;Iontophoresis 4mg /ml Dexamethasone;Traction;Gait training;Balance training;Patient/family education;Manual techniques;Dry needling;Ultrasound   traction if appropriate   PT Next Visit Plan  scapular strengthening, thoracic extension, hip and trunk strengthening, balance, manual techniques, modalities PRN    Consulted and Agree with Plan of Care  Patient       Patient will benefit from skilled therapeutic intervention in order to improve the following deficits and impairments:  Pain, Postural dysfunction, Improper body mechanics, Difficulty walking, Decreased strength  Visit Diagnosis: History of falling  Unsteadiness on  feet  Muscle weakness (generalized)  Difficulty in walking, not elsewhere classified  Chronic low back pain, unspecified back pain laterality, unspecified whether sciatica present     Problem List Patient Active Problem List   Diagnosis Date Noted  . Acute bronchitis 10/06/2016    Loralyn Freshwater PT, DPT   05/13/2018, 2:54 PM  [place] [facility] PHYSICAL AND SPORTS MEDICINE [address], [place], [address] Phone: [phone]   Fax:  [phone]  Name: PACHIA STRUM MRN: [id number] Date of Birth: 1940/12/06

## 2018-05-15 ENCOUNTER — Ambulatory Visit: Payer: Medicare Other

## 2018-05-15 DIAGNOSIS — Z9181 History of falling: Secondary | ICD-10-CM

## 2018-05-15 DIAGNOSIS — R2681 Unsteadiness on feet: Secondary | ICD-10-CM

## 2018-05-15 DIAGNOSIS — M6281 Muscle weakness (generalized): Secondary | ICD-10-CM

## 2018-05-15 DIAGNOSIS — M545 Low back pain, unspecified: Secondary | ICD-10-CM

## 2018-05-15 DIAGNOSIS — G8929 Other chronic pain: Secondary | ICD-10-CM

## 2018-05-15 DIAGNOSIS — R262 Difficulty in walking, not elsewhere classified: Secondary | ICD-10-CM

## 2018-05-15 NOTE — Therapy (Signed)
[place] [facility] [address], [place], [address] Phone: [phone]   Fax:  [phone]  Physical Therapy Treatment  Patient Details  Name: Kara Pennington MRN: [id number] Date of Birth: [birth year] Referring Provider (PT): Romeo Apple A. Cora Daniels, MD   Encounter Date: 05/15/2018  PT End of Session - 05/15/18 1304    Visit Number  4    Number of Visits  17    Date for PT Re-Evaluation  06/26/18    Authorization Type  4    Authorization Time Period  of 10 progress note    PT Start Time  1305    PT Stop Time  1348    PT Time Calculation (min)  43 min    Equipment Utilized During Treatment  Gait belt    Activity Tolerance  Patient tolerated treatment well    Behavior During Therapy  WFL for tasks assessed/performed       Past Medical History:  Diagnosis Date  . Anxiety   . Depression   . GERD (gastroesophageal reflux disease)   . Hypertension     Past Surgical History:  Procedure Laterality Date  . APPENDECTOMY    . PARATHYROIDECTOMY      There were no vitals filed for this visit.  Subjective Assessment - 05/15/18 1305    Subjective  Doing good. Walking is pretty good. Her friends commented that her walking is better. Wakes up with her back stiff. Has to use a walker initially but losens up and does not have to use it the rest of the day.  Leaving for her cruise on Tuesday, returning 05/29/2018.     Pertinent History  Gait instability. Doctor took her off her blood pressure medicine and has not had the light headedness since. Balance feels better. Has moved to a [facility] center [facility]).  Gets 3 meals a day.  Was diagnosed with malnutrition and dehydration in July 2019 because she was not eating before. Currently working on her diet.  Has been eating protein and salads. Currently on elequis. Forgets to take it in the morning.  Last fall was January 09, 2018. Pt tripped over the threshold when entering her  appartment.  Pt was taking a buggy and a carton of Coke and lost balance. No falls since then.  Found a new home for her golden retriever mix dog.   Dizziness is gone.  Energy levels are not the best but pt was alone at the time she was alone during last PT bout. Now pt has friends.  Pt loves the social interactions.  Pt also states having disc problems arond L3 and L 4.  Going [place], [place].   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 No activity restriction per pt.     Patient Stated Goals  Walk better, have more energy.     Currently in Pain?  No/denies    Pain Score  0-No pain    Pain Onset  More than a month ago                               PT Education - 05/15/18 1309    Education Details  ther-ex, HEP    Person(s) Educated  Patient    Methods  Explanation;Demonstration;Tactile cues;Verbal cues  Comprehension  Returned demonstration;Verbalized understanding      Objective    Cleared to resume PTper MD referral No latex band allergies  MedbridgeAccess Code: [email or web address]   Manual therapy  Seated STM R upper trap muscle area to help promote L cervical rotation while walking     Therapeutic exercise  Seated trunk rotation 10x2 each side  Reviewed and given as part of her HEP. Pt demonstrated and verbalized understanding.     Gait with R and L head turns             32 ft 4x            Less decreased balance with walking while head is turned to the L observed  Standing scapular retraction red band 10x5 seconds for 2 sets to promote thoracic extension  Standing hip abduction resisting 2 lbs ankle weight with B UE assist 10x  Then 1 lb 10x each LE  Side stepping 32 ft to the L and 32 ft to the R   Forward wedding march 32 ft x 2  Gait with stepping over 2 fallen canes 5x  Static mini squats with B UE assist 5x5 seconds     Improved exercise technique, movement at target joints, use of target muscles after mod verbal, visual, tactile cues.    Decreased unsteadiness with walking with her head turned to the L compared to previous visit. Continued working on decreasing R upper trap muscle area tension to promote ability to turn her head, as well as worked on thoracic extension and glute muscle strengthening to help decrease pressure to low back and promote balance.  Added static mini squats to promote strength with ergonomic lifting. Pt tolerated session well without aggravation of symptoms.              PT Short Term Goals - 04/30/18 1952      PT SHORT TERM GOAL #1   Title  Patient will be independent with her HEP to improve balance and decrease back pain.     Baseline  Patient has started her HEP (04/30/2018)    Time  3    Period  Weeks    Status  New    Target Date  05/22/18        PT Long Term Goals - 04/30/18 1953      PT LONG TERM GOAL #1   Title  Patient will improve her DGI score to 19/24 or greater as a demonstration of decreased fall risk.     Baseline  17/24 (04/30/2018)     Time  8    Period  Weeks    Status  New    Target Date  06/26/18      PT LONG TERM GOAL #2   Title  Patient will improve B glute med and max strength by at least 1/2 MMT grade to promote balance as well as improve ability to perform standing tasks with less back pain.     Time  8    Period  Weeks    Status  New    Target Date  06/26/18      PT LONG TERM GOAL #3   Title  Patient will have a decrease in back pain to 2/10 or less at most to promote ability to perform standing tasks.     Baseline  4/10 back pain at most for the past 3 months (04/30/2018)    Time  8    Period  Weeks  Status  New    Target Date  06/26/18      PT LONG TERM GOAL #4   Title  Patient will improve her lower leg FOTO score by at least 10 points as a demonstration of improved function.     Baseline  Lower leg FOTO 44  (04/30/2018)    Time  8    Period  Weeks    Status  New    Target Date  06/26/18      PT LONG TERM GOAL #5   Title  Patient will improve her LEFS score by at least 10 points as a demonstration of improved function.     Baseline  32/80 (04/30/2018)    Time  8    Period  Weeks    Status  New    Target Date  06/26/18            Plan - 05/15/18 1304    Clinical Impression Statement  Decreased unsteadiness with walking with her head turned to the L compared to previous visit. Continued working on decreasing R upper trap muscle area tension to promote ability to turn her head, as well as worked on thoracic extension and glute muscle strengthening to help decrease pressure to low back and promote balance.  Added static mini squats to promote strength with ergonomic lifting. Pt tolerated session well without aggravation of symptoms.     Rehab Potential  Fair    Clinical Impairments Affecting Rehab Potential  (-) Chronicity of condition, age; (+) motivated     PT Frequency  2x / week    PT Duration  8 weeks    PT Treatment/Interventions  Therapeutic activities;Therapeutic exercise;Neuromuscular re-education;Aquatic Therapy;Electrical Stimulation;Iontophoresis 4mg /ml Dexamethasone;Traction;Gait training;Balance training;Patient/family education;Manual techniques;Dry needling;Ultrasound   traction if appropriate   PT Next Visit Plan  scapular strengthening, thoracic extension, hip and trunk strengthening, balance, manual techniques, modalities PRN    Consulted and Agree with Plan of Care  Patient       Patient will benefit from skilled therapeutic intervention in order to improve the following deficits and impairments:  Pain, Postural dysfunction, Improper body mechanics, Difficulty walking, Decreased strength  Visit Diagnosis: History of falling  Unsteadiness on feet  Muscle weakness (generalized)  Difficulty in walking, not elsewhere classified  Chronic low back pain, unspecified  back pain laterality, unspecified whether sciatica present     Problem List Patient Active Problem List   Diagnosis Date Noted  . Acute bronchitis 10/06/2016    Loralyn Freshwater PT, DPT   05/15/2018, 7:31 PM  [facility] PHYSICAL AND SPORTS MEDICINE [address] [place], [address] Phone: [phone]   Fax:  [phone]  Name: Kara Pennington MRN: [id number] Date of Birth: [birth year]

## 2018-05-15 NOTE — Patient Instructions (Signed)
Seated trunk rotation 10x2 each side  Reviewed and given as part of her HEP. Pt demonstrated and verbalized understanding.

## 2018-06-02 ENCOUNTER — Ambulatory Visit: Payer: Medicare Other

## 2018-06-02 ENCOUNTER — Telehealth: Payer: Self-pay

## 2018-06-02 NOTE — Telephone Encounter (Signed)
No show. Called patient phone number but unable to leave a message at her voice mailbox.

## 2018-06-03 ENCOUNTER — Ambulatory Visit: Payer: Medicare Other

## 2018-06-03 DIAGNOSIS — G8929 Other chronic pain: Secondary | ICD-10-CM

## 2018-06-03 DIAGNOSIS — R262 Difficulty in walking, not elsewhere classified: Secondary | ICD-10-CM

## 2018-06-03 DIAGNOSIS — M6281 Muscle weakness (generalized): Secondary | ICD-10-CM

## 2018-06-03 DIAGNOSIS — Z9181 History of falling: Secondary | ICD-10-CM

## 2018-06-03 DIAGNOSIS — M545 Low back pain, unspecified: Secondary | ICD-10-CM

## 2018-06-03 DIAGNOSIS — R2681 Unsteadiness on feet: Secondary | ICD-10-CM

## 2018-06-03 NOTE — Therapy (Signed)
[facility] PH[facility]ORTS MEDICINE [address] [place], [address] Phone: [phone]   Fax:  [phone]  Physical Therapy Treatment  Patient Details  Name: FANTA WIMBERLEY MRN: [id number] Date of Birth: 07-Apr-1941 Referring Provider (PT): Romeo Apple A. Cora Daniels, MD   Encounter Date: 06/03/2018  PT End of Session - 06/03/18 1520    Visit Number  5    Number of Visits  17    Date for PT Re-Evaluation  06/26/18    Authorization Type  5    Authorization Time Period  of 10 progress note    PT Start Time  1521    PT Stop Time  1602    PT Time Calculation (min)  41 min    Equipment Utilized During Treatment  Gait belt    Activity Tolerance  Patient tolerated treatment well    Behavior During Therapy  WFL for tasks assessed/performed       Past Medical History:  Diagnosis Date  . Anxiety   . Depression   . GERD (gastroesophageal reflux disease)   . Hypertension     Past Surgical History:  Procedure Laterality Date  . APPENDECTOMY    . PARATHYROIDECTOMY      There were no vitals filed for this visit.  Subjective Assessment - 06/03/18 1522    Subjective  Back is pretty good. [place] the [place] due to the gentle motion. Glad she brought her walker. Walking is pretty good on land.  Only uses her walker for breakfast due to stiffness or due to prolonged walking.   4/10 back pain at most for the past 7 days, usually in the morning, 2/10 at most after 10 am.     Pertinent History  Gait instability. Doctor took her off her blood pressure medicine and has not had the light headedness since. Balance feels better. Has moved to a [facility] center [facility]).  Gets 3 meals a day.  Was diagnosed with malnutrition and dehydration in July 2019 because she was not eating before. Currently working on her diet.  Has been eating protein and salads. Currently on elequis. Forgets to take it in the morning.  Last fall was January 09, 2018.  Pt tripped over the threshold when entering her appartment.  Pt was taking a buggy and a carton of Coke and lost balance. No falls since then.  Found a new home for her golden retriever mix dog.   Dizziness is gone.  Energy levels are not the best but pt was alone at the time she was alone during last PT bout. Now pt has friends.  Pt loves the social interactions.  Pt also states having disc problems arond L3 and L 4.  Going [place], [place].   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 No activity restriction per pt.     Patient Stated Goals  Walk better, have more energy.     Currently in Pain?  No/denies    Pain Score  0-No pain    Pain Onset  More than a month ago                               PT Education - 06/03/18 1525    Education Details  ther-ex    Person(s)  Educated  Patient    Methods  Explanation;Demonstration;Tactile cues;Verbal cues    Comprehension  Returned demonstration;Verbalized understanding        Objective   No latex band allergies  MedbridgeAccess Code: [email or web address]   Manual therapy  Seated STM B upper trap muscle area to help promote cervical rotation while walking    Therapeutic exercise  Seated trunk rotation 10x2 each side          Seated anterior/posterior pelvic tilts 10x3 each direction  Gait with R and L head turns 32 ft 5x less unsteadiness observed  Standing hip abduction resisting 2 lbs ankle weight with B UE assist 10x2  Standing scapular retraction red band 10x5 seconds for 2 sets to promote thoracic extension  Forward wedding march 32 ft x 2  Lateral trunk lean  Standing low rows resisting red band 5x5 seconds to promote trunk muscle strengthening to decrease trunk lean during gait.   Low back ache  physioball rolls  5x5 seconds flexion  Seated trunk flexion gentle 30  seconds x2. Decreased low back ache  Improved exercise technique, movement at target joints, use of target muscles after min to mod verbal, visual, tactile cues.    Continued working on low back and trunk mobility to help decrease back pain and stiffness, as well as decreasing muscle tension around neck to promote ability to turn her head while walking. Able to ambulate with head turns with decreased unsteadiness today compared to previous visits.            PT Short Term Goals - 04/30/18 1952      PT SHORT TERM GOAL #1   Title  Patient will be independent with her HEP to improve balance and decrease back pain.     Baseline  Patient has started her HEP (04/30/2018)    Time  3    Period  Weeks    Status  New    Target Date  05/22/18        PT Long Term Goals - 04/30/18 1953      PT LONG TERM GOAL #1   Title  Patient will improve her DGI score to 19/24 or greater as a demonstration of decreased fall risk.     Baseline  17/24 (04/30/2018)     Time  8    Period  Weeks    Status  New    Target Date  06/26/18      PT LONG TERM GOAL #2   Title  Patient will improve B glute med and max strength by at least 1/2 MMT grade to promote balance as well as improve ability to perform standing tasks with less back pain.     Time  8    Period  Weeks    Status  New    Target Date  06/26/18      PT LONG TERM GOAL #3   Title  Patient will have a decrease in back pain to 2/10 or less at most to promote ability to perform standing tasks.     Baseline  4/10 back pain at most for the past 3 months (04/30/2018)    Time  8    Period  Weeks    Status  New    Target Date  06/26/18      PT LONG TERM GOAL #4   Title  Patient will improve her lower leg FOTO score by at least 10 points as a demonstration of improved function.  Baseline  Lower leg FOTO 44 (04/30/2018)    Time  8    Period  Weeks    Status  New    Target Date  06/26/18      PT LONG TERM GOAL #5   Title  Patient will  improve her LEFS score by at least 10 points as a demonstration of improved function.     Baseline  32/80 (04/30/2018)    Time  8    Period  Weeks    Status  New    Target Date  06/26/18            Plan - 06/03/18 1525    Clinical Impression Statement  Continued working on low back and trunk mobility to help decrease back pain and stiffness, as well as decreasing muscle tension around neck to promote ability to turn her head while walking. Able to ambulate with head turns with decreased unsteadiness today compared to previous visits.     Rehab Potential  Fair    Clinical Impairments Affecting Rehab Potential  (-) Chronicity of condition, age; (+) motivated     PT Frequency  2x / week    PT Duration  8 weeks    PT Treatment/Interventions  Therapeutic activities;Therapeutic exercise;Neuromuscular re-education;Aquatic Therapy;Electrical Stimulation;Iontophoresis 4mg /ml Dexamethasone;Traction;Gait training;Balance training;Patient/family education;Manual techniques;Dry needling;Ultrasound   traction if appropriate   PT Next Visit Plan  scapular strengthening, thoracic extension, hip and trunk strengthening, balance, manual techniques, modalities PRN    Consulted and Agree with Plan of Care  Patient       Patient will benefit from skilled therapeutic intervention in order to improve the following deficits and impairments:  Pain, Postural dysfunction, Improper body mechanics, Difficulty walking, Decreased strength  Visit Diagnosis: History of falling  Unsteadiness on feet  Muscle weakness (generalized)  Difficulty in walking, not elsewhere classified  Chronic low back pain, unspecified back pain laterality, unspecified whether sciatica present     Problem List Patient Active Problem List   Diagnosis Date Noted  . Acute bronchitis 10/06/2016    Loralyn Freshwater PT, DPT   06/03/2018, 8:46 PM  [facility] PHYSICAL AND SPORTS MEDICINE [address], [place], [address] Phone: [phone]   Fax:  [phone]  Name: KYNSLI HAAPALA MRN: [id number] Date of Birth: 20-Apr-1941

## 2018-06-12 ENCOUNTER — Ambulatory Visit: Payer: Medicare Other | Attending: Geriatric Medicine

## 2018-06-12 DIAGNOSIS — R262 Difficulty in walking, not elsewhere classified: Secondary | ICD-10-CM | POA: Insufficient documentation

## 2018-06-12 DIAGNOSIS — Z9181 History of falling: Secondary | ICD-10-CM | POA: Insufficient documentation

## 2018-06-12 DIAGNOSIS — G8929 Other chronic pain: Secondary | ICD-10-CM | POA: Insufficient documentation

## 2018-06-12 DIAGNOSIS — M545 Low back pain: Secondary | ICD-10-CM | POA: Insufficient documentation

## 2018-06-12 DIAGNOSIS — M6281 Muscle weakness (generalized): Secondary | ICD-10-CM | POA: Insufficient documentation

## 2018-06-12 DIAGNOSIS — R2681 Unsteadiness on feet: Secondary | ICD-10-CM | POA: Insufficient documentation

## 2018-06-16 ENCOUNTER — Ambulatory Visit: Payer: Medicare Other

## 2018-06-19 ENCOUNTER — Telehealth: Payer: Self-pay

## 2018-06-19 ENCOUNTER — Ambulatory Visit: Payer: Medicare Other

## 2018-06-19 NOTE — Telephone Encounter (Signed)
No show. Called patient phone number but unable to leave a message due to her voice mailbox not set up yet.

## 2018-06-23 ENCOUNTER — Ambulatory Visit: Payer: Medicare Other

## 2018-06-23 DIAGNOSIS — G8929 Other chronic pain: Secondary | ICD-10-CM | POA: Diagnosis present

## 2018-06-23 DIAGNOSIS — Z9181 History of falling: Secondary | ICD-10-CM | POA: Diagnosis not present

## 2018-06-23 DIAGNOSIS — M6281 Muscle weakness (generalized): Secondary | ICD-10-CM | POA: Diagnosis present

## 2018-06-23 DIAGNOSIS — R2681 Unsteadiness on feet: Secondary | ICD-10-CM

## 2018-06-23 DIAGNOSIS — R262 Difficulty in walking, not elsewhere classified: Secondary | ICD-10-CM

## 2018-06-23 DIAGNOSIS — M545 Low back pain, unspecified: Secondary | ICD-10-CM

## 2018-06-23 NOTE — Therapy (Signed)
[facility]IC[facility] [address] [place], [address] Phone: [phone]   Fax:  [phone]  Physical Therapy Treatment  Patient Details  Name: Kara Pennington MRN: [id number] Date of Birth: 10/25/1940 Referring Provider (PT): Kara Apple A. Cora Daniels, MD   Encounter Date: 06/23/2018  PT End of Session - 06/23/18 1518    Visit Number  6    Number of Visits  17    Date for PT Re-Evaluation  06/26/18    Authorization Type  6    Authorization Time Period  of 10 progress note    PT Start Time  1519    PT Stop Time  1602    PT Time Calculation (min)  43 min    Equipment Utilized During Treatment  Gait belt    Activity Tolerance  Patient tolerated treatment well    Behavior During Therapy  WFL for tasks assessed/performed       Past Medical History:  Diagnosis Date  . Anxiety   . Depression   . GERD (gastroesophageal reflux disease)   . Hypertension     Past Surgical History:  Procedure Laterality Date  . APPENDECTOMY    . PARATHYROIDECTOMY      There were no vitals filed for this visit.  Subjective Assessment - 06/23/18 1520    Subjective  Went without her car keys for a week. Daughter in law found her car keys. No back pain currently.  1/10 back pain at most for the past 7 days. Physically she is feeling well. Has a hard time with Christmas holidays.  Feels like she can graduate PT after next session.  Has not really been doing exercises due to sleeping all day.     Pertinent History  Gait instability. Doctor took her off her blood pressure medicine and has not had the light headedness since. Balance feels better. Has moved to a [facility] center [facility]  Gets 3 meals a day.  Was diagnosed with malnutrition and dehydration in July 2019 because she was not eating before. Currently working on her diet.  Has been eating protein and salads. Currently on elequis. Forgets to take it in the morning.  Last fall was  January 09, 2018. Pt tripped over the threshold when entering her appartment.  Pt was taking a buggy and a carton of Coke and lost balance. No falls since then.  Found a new home for her golden retriever mix dog.   Dizziness is gone.  Energy levels are not the best but pt was alone at the time she was alone during last PT bout. Now pt has friends.  Pt loves the social interactions.  Pt also states having disc problems arond L3 and L 4.  Going [place], [place].   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 No activity restriction per pt.     Patient Stated Goals  Walk better, have more energy.     Currently in Pain?  No/denies    Pain Score  0-No pain    Pain Onset  More than a month ago         [facility] PT Assessment - 06/23/18 0001      Observation/Other Assessments   Focus on Therapeutic Outcomes (FOTO)   53    Lower Extremity Functional Scale   60/80  Strength   Right Hip Extension  4+/5   seated manually resisted   Right Hip ABduction  4+/5   seated manually resisted clamshell isometric   Left Hip Extension  4+/5   seated manually resisted   Left Hip ABduction  4+/5   seated manually resisted clamshell isometric     Dynamic Gait Index   Level Surface  Normal    Change in Gait Speed  Mild Impairment    Gait with Horizontal Head Turns  Mild Impairment    Gait with Vertical Head Turns  Moderate Impairment   looking up   Gait and Pivot Turn  Normal    Step Over Obstacle  Mild Impairment    Step Around Obstacles  Normal    Steps  Mild Impairment    Total Score  18    DGI comment:  Less than 19/24 suggests increased fall risk.                            PT Education - 06/23/18 1544    Education Details  ther-ex    Person(s) Educated  Patient    Methods  Explanation;Demonstration;Tactile cues;Verbal cues    Comprehension  Returned  demonstration;Verbalized understanding        Objective   No latex band allergies  MedbridgeAccess Code: [email or web address]   Manual therapy  Seated STM B upper trap muscle area to help promote cervical rotation while walking    Therapeutic exercise   Seated manually resisted hip extension, seated manually resisted clamshell (hips less than 90 degrees flexion) 1x each way for each LE  Reviewed progress/current status with strength with pt.   Directed patient with gait with normal gait speed, with changes in speed, 180 degree pivot turn, with R and L cervical rotation position, with cervical flexion and extension position, stepping around obstacles, stepping over an obstacle, ascending and descending 4 regular steps with UE assist   Chin tucks 10x5 seconds for 2 sets  Seated trunk rotation 10x each side  Seated anterior/posterior pelvic tilts 10x3 each direction  Standing hip abduction resisting 2 lbs ankle weight with B UE assist 10x2 with B UE assist   Forward wedding march 32 ft x 2             Lateral trunk lean    Improved exercise technique, movement at target joints, use of target muscles after min to mod verbal, visual, tactile cues.    Pt demonstrates improved B glute strength, as well as decreased back pain since initial evaluation. Slight improvement in DGI with pivot turn. Difficulty with gait when looking up. Pt also demonstrates improved FOTO and LEFS scores suggesting improved function since initial evaluation. Continued working on low back mobility and glute strength to help decrease stress to low back when walking as well as improving balance. Also worked on chin tuck to promote upper thoracic extension when looking up while walking. Patient making very good progress with PT towards goals.      PT Short Term Goals - 04/30/18 1952      PT SHORT TERM GOAL #1   Title  Patient will be independent with her HEP to improve balance and decrease back pain.      Baseline  Patient has started her HEP (04/30/2018)    Time  3    Period  Weeks    Status  New    Target Date  05/22/18  PT Long Term Goals - 04/30/18 1953      PT LONG TERM GOAL #1   Title  Patient will improve her DGI score to 19/24 or greater as a demonstration of decreased fall risk.     Baseline  17/24 (04/30/2018)     Time  8    Period  Weeks    Status  New    Target Date  06/26/18      PT LONG TERM GOAL #2   Title  Patient will improve B glute med and max strength by at least 1/2 MMT grade to promote balance as well as improve ability to perform standing tasks with less back pain.     Time  8    Period  Weeks    Status  New    Target Date  06/26/18      PT LONG TERM GOAL #3   Title  Patient will have a decrease in back pain to 2/10 or less at most to promote ability to perform standing tasks.     Baseline  4/10 back pain at most for the past 3 months (04/30/2018)    Time  8    Period  Weeks    Status  New    Target Date  06/26/18      PT LONG TERM GOAL #4   Title  Patient will improve her lower leg FOTO score by at least 10 points as a demonstration of improved function.     Baseline  Lower leg FOTO 44 (04/30/2018)    Time  8    Period  Weeks    Status  New    Target Date  06/26/18      PT LONG TERM GOAL #5   Title  Patient will improve her LEFS score by at least 10 points as a demonstration of improved function.     Baseline  32/80 (04/30/2018)    Time  8    Period  Weeks    Status  New    Target Date  06/26/18            Plan - 06/23/18 1545    Clinical Impression Statement  Pt demonstrates improved B glute strength, as well as decreased back pain since initial evaluation. Slight improvement in DGI with pivot turn. Difficulty with gait when looking up. Pt also demonstrates improved FOTO and LEFS scores suggesting improved function since initial evaluation. Continued working on low back mobility and glute strength to help decrease stress to  low back when walking as well as improving balance. Also worked on chin tuck to promote upper thoracic extension when looking up while walking. Patient making very good progress with PT towards goals.     Rehab Potential  Fair    Clinical Impairments Affecting Rehab Potential  (-) Chronicity of condition, age; (+) motivated     PT Frequency  2x / week    PT Duration  8 weeks    PT Treatment/Interventions  Therapeutic activities;Therapeutic exercise;Neuromuscular re-education;Aquatic Therapy;Electrical Stimulation;Iontophoresis 4mg /ml Dexamethasone;Traction;Gait training;Balance training;Patient/family education;Manual techniques;Dry needling;Ultrasound   traction if appropriate   PT Next Visit Plan  scapular strengthening, thoracic extension, hip and trunk strengthening, balance, manual techniques, modalities PRN    Consulted and Agree with Plan of Care  Patient       Patient will benefit from skilled therapeutic intervention in order to improve the following deficits and impairments:  Pain, Postural dysfunction, Improper body mechanics, Difficulty walking, Decreased strength  Visit Diagnosis: History  of falling  Unsteadiness on feet  Muscle weakness (generalized)  Difficulty in walking, not elsewhere classified  Chronic low back pain, unspecified back pain laterality, unspecified whether sciatica present     Problem List Patient Active Problem List   Diagnosis Date Noted  . Acute bronchitis 10/06/2016    Loralyn Freshwater PT, DPT   06/23/2018, 5:13 PM  [facility] PHYSICAL AND SPORTS MEDICINE [address] [place], [address] Phone: [phone]   Fax:  [phone]  Name: Kara Pennington MRN: [id number] Date of Birth: 1940/08/11

## 2018-06-26 ENCOUNTER — Ambulatory Visit: Payer: Medicare Other

## 2018-06-26 DIAGNOSIS — G8929 Other chronic pain: Secondary | ICD-10-CM

## 2018-06-26 DIAGNOSIS — M545 Low back pain, unspecified: Secondary | ICD-10-CM

## 2018-06-26 DIAGNOSIS — M6281 Muscle weakness (generalized): Secondary | ICD-10-CM

## 2018-06-26 DIAGNOSIS — Z9181 History of falling: Secondary | ICD-10-CM | POA: Diagnosis not present

## 2018-06-26 DIAGNOSIS — R262 Difficulty in walking, not elsewhere classified: Secondary | ICD-10-CM

## 2018-06-26 DIAGNOSIS — R2681 Unsteadiness on feet: Secondary | ICD-10-CM

## 2018-06-26 NOTE — Patient Instructions (Signed)
Sitting on a chair,    Turn your trunk to the right and to the left comfortably   Repeat 10 times    Perform 3 sets daily.

## 2018-06-26 NOTE — Therapy (Signed)
[facility] [address] [place], [address] Phone: [phone]   Fax:  [phone]  Physical Therapy Treatment And Discharge Summary    Patient Details  Name: Kara Pennington MRN: [id number] Date of Birth: 1940-12-14 Referring Provider (PT): Suezanne Jacquet A. Sheffield Slider, MD   Encounter Date: 06/26/2018  PT End of Session - 06/26/18 1351    Visit Number  7    Number of Visits  17    Date for PT Re-Evaluation  06/26/18    Authorization Type  7    Authorization Time Period  of 10 progress note    PT Start Time  1351    PT Stop Time  1422    PT Time Calculation (min)  31 min    Equipment Utilized During Treatment  Gait belt    Activity Tolerance  Patient tolerated treatment well    Behavior During Therapy  WFL for tasks assessed/performed       Past Medical History:  Diagnosis Date  . Anxiety   . Depression   . GERD (gastroesophageal reflux disease)   . Hypertension     Past Surgical History:  Procedure Laterality Date  . APPENDECTOMY    . PARATHYROIDECTOMY      There were no vitals filed for this visit.  Subjective Assessment - 06/26/18 1352    Subjective  Difficulty with the holidays. Also had a hard time sleeping yesterday. Walking is pretty good. Back hurt this morning but it worked itself out. No back pain currently.  Still feels like today is a good day to graduate physical therapy. Children are noticing that she is walking better.  Has not been doing her HEP but she is going to start.  Has not eaten yet because she slept through breakfast and lunch.  Last meal was yesterday dinner. Only eats when she is hungry. Pt states later during the session that she is hungry.     Pertinent History  Gait instability. Doctor took her off her blood pressure medicine and has not had the light headedness since. Balance feels better. Has moved to a [facility] Living center [facility]).  Gets 3 meals a day.  Was diagnosed  with malnutrition and dehydration in July 2019 because she was not eating before. Currently working on her diet.  Has been eating protein and salads. Currently on elequis. Forgets to take it in the morning.  Last fall was January 09, 2018. Pt tripped over the threshold when entering her appartment.  Pt was taking a buggy and a carton of Coke and lost balance. No falls since then.  Found a new home for her golden retriever mix dog.   Dizziness is gone.  Energy levels are not the best but pt was alone at the time she was alone during last PT bout. Now pt has friends.  Pt loves the social interactions.  Pt also states having disc problems arond L3 and L 4.  Going on a week long cruise to the [place] May 21, 2018.   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 No activity restriction per pt.     Patient Stated Goals  Walk better, have more energy.     Currently in Pain?  No/denies    Pain Score  0-No pain    Pain Onset  More than a month ago         [facility]  PT Assessment - 06/26/18 0001      Observation/Other Assessments   Focus on Therapeutic Outcomes (FOTO)   53   measured on 06/23/2018   Lower Extremity Functional Scale   60/80   measured on 06/23/2018     Strength   Overall Strength Comments  strength measured on 06/23/2018    Right Hip Extension  4+/5   seated manually resisted   Right Hip ABduction  4+/5   seated manually resisted clamshell isometric   Left Hip Extension  4+/5   seated manually resisted   Left Hip ABduction  4+/5   seated manually resisted clamshell isometric     Dynamic Gait Index   Level Surface  Normal    Change in Gait Speed  Mild Impairment    Gait with Horizontal Head Turns  Mild Impairment    Gait with Vertical Head Turns  Moderate Impairment   looking up   Gait and Pivot Turn  Normal    Step Over Obstacle  Mild Impairment    Step Around Obstacles  Normal    Steps  Mild Impairment     Total Score  18    DGI comment:  Less than 19/24 suggests increased fall risk.    measured on 06/23/2018                          PT Education - 06/26/18 1407    Education Details  ther-ex, reviewed HEP    Person(s) Educated  Patient    Methods  Explanation;Demonstration;Tactile cues;Verbal cues;Handout    Comprehension  Returned demonstration;Verbalized understanding        Objective   No latex band allergies  MedbridgeAccess Code: [email or web address]   Manual therapy  Seated STM R cervical paraspinal muscle to decrease tension  Seated STM B upper trap muscle area to help promote cervical rotation while walking    Therapeutic exercise  Seated trunk rotation 10x2 each side  Seated anterior/posterior pelvic tilts 10x2 each direction  Side stepping 32 ft to the R and 32 ft to the L   Forward wedding march 32 ft x 2             Lateral trunk lean  Reviewed HEP. Pt verbalized understanding. Handout provided.     Improved exercise technique, movement at target joints, use of target muscles after min to mod verbal, visual, tactile cues.     Light session today secondary to pt last meal was dinner yesterday and pt reports being hungry. Pt demonstrates improved bilateral glute strength, decreased back pain, and improved function since initial evaluation. Slight improvement with DGI with pivot turn. Difficulty with gait when looking up. Skilled physical therapy services discharged with pt continuing progress with her exercises at home as well as pt reports of feeling like she can graduate today. Reviewed her HEP and provided handout. Pt verbalized understanding.      PT Short Term Goals - 06/26/18 1442      PT SHORT TERM GOAL #1   Title  Patient will be independent with her HEP to improve balance and decrease back pain.     Baseline  Patient has started her HEP (04/30/2018); Pt has not been performing her HEP based on subjective reports  (06/26/2018)    Time  3    Period  Weeks    Status  On-going    Target Date  05/22/18        PT Long Term Goals -  06/26/18 1443      PT LONG TERM GOAL #1   Title  Patient will improve her DGI score to 19/24 or greater as a demonstration of decreased fall risk.     Baseline  17/24 (04/30/2018), 18/24 (06/26/2018)    Time  8    Period  Weeks    Status  Partially Met    Target Date  06/26/18      PT LONG TERM GOAL #2   Title  Patient will improve B glute med and max strength by at least 1/2 MMT grade to promote balance as well as improve ability to perform standing tasks with less back pain.     Baseline  4+/5 B LE for both ways (06/26/2018)    Time  8    Period  Weeks    Status  Achieved    Target Date  06/26/18      PT LONG TERM GOAL #3   Title  Patient will have a decrease in back pain to 2/10 or less at most to promote ability to perform standing tasks.     Baseline  4/10 back pain at most for the past 3 months (04/30/2018); 1/10 at worst for the past 7 days (06/23/2018)    Time  8    Period  Weeks    Status  Achieved    Target Date  06/26/18      PT LONG TERM GOAL #4   Title  Patient will improve her lower leg FOTO score by at least 10 points as a demonstration of improved function.     Baseline  Lower leg FOTO 44 (04/30/2018); 53 (06/26/2018)     Time  8    Period  Weeks    Status  Partially Met    Target Date  06/26/18      PT LONG TERM GOAL #5   Title  Patient will improve her LEFS score by at least 10 points as a demonstration of improved function.     Baseline  32/80 (04/30/2018); 60/80 (06/26/2018)    Time  8    Period  Weeks    Status  Achieved    Target Date  06/26/18            Plan - 06/26/18 1408    Clinical Impression Statement  Light session today secondary to pt last meal was dinner yesterday and pt reports being hungry. Pt demonstrates improved bilateral glute strength, decreased back pain, and improved function since initial evaluation.  Slight improvement with DGI with pivot turn. Difficulty with gait when looking up. Skilled physical therapy services discharged with pt continuing progress with her exercises at home as well as pt reports of feeling like she can graduate today. Reviewed her HEP and provided handout. Pt verbalized understanding.     History and Personal Factors relevant to plan of care:  chronicity of condition, back pain    Clinical Presentation  Stable    Clinical Presentation due to:  pt has made progress with PT towards goals    Clinical Decision Making  Low    Rehab Potential  Fair    Clinical Impairments Affecting Rehab Potential  (-) Chronicity of condition, age; (+) motivated     PT Frequency  --    PT Duration  --    PT Treatment/Interventions  Therapeutic activities;Therapeutic exercise;Neuromuscular re-education;Gait training;Balance training;Patient/family education;Manual techniques   traction if appropriate   PT Next Visit Plan  continue progress with her HEP  Consulted and Agree with Plan of Care  Patient       Patient will benefit from skilled therapeutic intervention in order to improve the following deficits and impairments:  Pain, Postural dysfunction, Improper body mechanics, Difficulty walking, Decreased strength  Visit Diagnosis: History of falling  Unsteadiness on feet  Muscle weakness (generalized)  Difficulty in walking, not elsewhere classified  Chronic low back pain, unspecified back pain laterality, unspecified whether sciatica present     Problem List Patient Active Problem List   Diagnosis Date Noted  . Acute bronchitis 10/06/2016    Thank you for your referral.  Joneen Boers PT, DPT   06/26/2018, 2:55 PM  [facility] [address], [place], [address] Phone: [phone]   Fax:  [phone]  Name: Kara Pennington MRN: [id number] Date of Birth: 1941/05/24

## 2018-06-30 ENCOUNTER — Ambulatory Visit: Payer: Medicare Other

## 2018-07-03 ENCOUNTER — Ambulatory Visit: Payer: Medicare Other

## 2020-02-03 ENCOUNTER — Other Ambulatory Visit: Payer: Self-pay

## 2020-02-03 ENCOUNTER — Emergency Department: Payer: Medicare Other

## 2020-02-03 ENCOUNTER — Observation Stay: Payer: Medicare Other

## 2020-02-03 ENCOUNTER — Observation Stay
Admission: EM | Admit: 2020-02-03 | Discharge: 2020-02-04 | Disposition: A | Discharge status: Home / Self Care | Payer: Medicare Other | Attending: Internal Medicine | Admitting: Internal Medicine

## 2020-02-03 DIAGNOSIS — R4701 Aphasia: Secondary | ICD-10-CM | POA: Diagnosis not present

## 2020-02-03 DIAGNOSIS — E860 Dehydration: Secondary | ICD-10-CM | POA: Insufficient documentation

## 2020-02-03 DIAGNOSIS — Z7982 Long term (current) use of aspirin: Secondary | ICD-10-CM | POA: Diagnosis not present

## 2020-02-03 DIAGNOSIS — I10 Essential (primary) hypertension: Secondary | ICD-10-CM | POA: Diagnosis not present

## 2020-02-03 DIAGNOSIS — Z20822 Contact with and (suspected) exposure to covid-19: Secondary | ICD-10-CM | POA: Diagnosis not present

## 2020-02-03 DIAGNOSIS — Z79899 Other long term (current) drug therapy: Secondary | ICD-10-CM | POA: Insufficient documentation

## 2020-02-03 DIAGNOSIS — G459 Transient cerebral ischemic attack, unspecified: Principal | ICD-10-CM | POA: Insufficient documentation

## 2020-02-03 LAB — PROTIME-INR
INR: 1.1 (ref 0.8–1.2)
Prothrombin Time: 13.3 seconds (ref 11.4–15.2)

## 2020-02-03 LAB — CBC
HCT: 40.1 % (ref 36.0–46.0)
Hemoglobin: 13.4 g/dL (ref 12.0–15.0)
MCH: 32.2 pg (ref 26.0–34.0)
MCHC: 33.4 g/dL (ref 30.0–36.0)
MCV: 96.4 fL (ref 80.0–100.0)
Platelets: 177 10*3/uL (ref 150–400)
RBC: 4.16 MIL/uL (ref 3.87–5.11)
RDW: 14.2 % (ref 11.5–15.5)
WBC: 6.5 10*3/uL (ref 4.0–10.5)
nRBC: 0 % (ref 0.0–0.2)

## 2020-02-03 LAB — COMPREHENSIVE METABOLIC PANEL
ALT: 22 U/L (ref 0–44)
AST: 34 U/L (ref 15–41)
Albumin: 3.7 g/dL (ref 3.5–5.0)
Alkaline Phosphatase: 60 U/L (ref 38–126)
Anion gap: 8 (ref 5–15)
BUN: 24 mg/dL — ABNORMAL HIGH (ref 8–23)
CO2: 26 mmol/L (ref 22–32)
Calcium: 9.4 mg/dL (ref 8.9–10.3)
Chloride: 103 mmol/L (ref 98–111)
Creatinine, Ser: 1.31 mg/dL — ABNORMAL HIGH (ref 0.44–1.00)
GFR calc Af Amer: 44 mL/min — ABNORMAL LOW (ref >60)
GFR calc non Af Amer: 38 mL/min — ABNORMAL LOW (ref >60)
Glucose, Bld: 118 mg/dL — ABNORMAL HIGH (ref 70–99)
Potassium: 4.1 mmol/L (ref 3.5–5.1)
Sodium: 137 mmol/L (ref 135–145)
Total Bilirubin: 1 mg/dL (ref 0.3–1.2)
Total Protein: 6.6 g/dL (ref 6.5–8.1)

## 2020-02-03 LAB — DIFFERENTIAL
Abs Immature Granulocytes: 0.01 10*3/uL (ref 0.00–0.07)
Basophils Absolute: 0.1 10*3/uL (ref 0.0–0.1)
Basophils Relative: 1 %
Eosinophils Absolute: 0.2 10*3/uL (ref 0.0–0.5)
Eosinophils Relative: 3 %
Immature Granulocytes: 0 %
Lymphocytes Relative: 24 %
Lymphs Abs: 1.6 10*3/uL (ref 0.7–4.0)
Monocytes Absolute: 0.6 10*3/uL (ref 0.1–1.0)
Monocytes Relative: 10 %
Neutro Abs: 4.1 10*3/uL (ref 1.7–7.7)
Neutrophils Relative %: 62 %

## 2020-02-03 LAB — APTT: aPTT: 28 seconds (ref 24–36)

## 2020-02-03 LAB — SARS CORONAVIRUS 2 BY RT PCR (HOSPITAL ORDER, PERFORMED IN ~~LOC~~ HOSPITAL LAB): SARS Coronavirus 2: NEGATIVE

## 2020-02-03 LAB — CK: Total CK: 57 U/L (ref 38–234)

## 2020-02-03 MED ORDER — ASPIRIN 81 MG PO CHEW
324.0000 mg | CHEWABLE_TABLET | Freq: Once | ORAL | Status: AC
  Administered 2020-02-03: 324 mg via ORAL
  Filled 2020-02-03: qty 4

## 2020-02-03 MED ORDER — LORAZEPAM 0.5 MG PO TABS: 0.5000 mg | ORAL_TABLET | Freq: Four times a day (QID) | ORAL | Status: DC | PRN

## 2020-02-03 MED ORDER — PANTOPRAZOLE SODIUM 40 MG PO TBEC
40.0000 mg | DELAYED_RELEASE_TABLET | Freq: Every day | ORAL | Status: DC
  Administered 2020-02-03 – 2020-02-04 (×2): 40 mg via ORAL
  Filled 2020-02-03 (×2): qty 1

## 2020-02-03 MED ORDER — IPRATROPIUM-ALBUTEROL 0.5-2.5 (3) MG/3ML IN SOLN: 3.0000 mL | Freq: Every day | RESPIRATORY_TRACT | Status: DC | PRN

## 2020-02-03 MED ORDER — GADOBUTROL 1 MMOL/ML IV SOLN
7.0000 mL | Freq: Once | INTRAVENOUS | Status: AC | PRN
  Administered 2020-02-03: 7 mL via INTRAVENOUS

## 2020-02-03 MED ORDER — METOPROLOL SUCCINATE ER 50 MG PO TB24
25.0000 mg | ORAL_TABLET | Freq: Every day | ORAL | Status: DC
  Administered 2020-02-03 – 2020-02-04 (×2): 25 mg via ORAL
  Filled 2020-02-03 (×2): qty 1

## 2020-02-03 MED ORDER — LACTATED RINGERS IV BOLUS
1000.0000 mL | Freq: Once | INTRAVENOUS | Status: AC
  Administered 2020-02-03: 1000 mL via INTRAVENOUS

## 2020-02-03 MED ORDER — ASPIRIN EC 81 MG PO TBEC
81.0000 mg | DELAYED_RELEASE_TABLET | Freq: Every day | ORAL | Status: DC
  Administered 2020-02-04: 81 mg via ORAL
  Filled 2020-02-03: qty 1

## 2020-02-03 MED ORDER — SODIUM CHLORIDE 0.9 % IV SOLN: INTRAVENOUS | Status: DC

## 2020-02-03 MED ORDER — SODIUM CHLORIDE 0.9% FLUSH: 3.0000 mL | Freq: Once | INTRAVENOUS | Status: DC

## 2020-02-03 MED ORDER — ENOXAPARIN SODIUM 40 MG/0.4ML ~~LOC~~ SOLN
40.0000 mg | SUBCUTANEOUS | Status: DC
  Administered 2020-02-03: 40 mg via SUBCUTANEOUS

## 2020-02-03 MED ORDER — IPRATROPIUM-ALBUTEROL 18-103 MCG/ACT IN AERO: 1.0000 | INHALATION_SPRAY | Freq: Every day | RESPIRATORY_TRACT | Status: DC | PRN

## 2020-02-03 NOTE — ED Notes (Signed)
Pt brought in via ems from [facility].  Pt had covid in January and was sent to [facility].  Pt was doing PT today outside and began having difficulty getting her words out at 1330.  Pt was hot and sweaty(96 degrees outside today)  Pt sent to ER for eval of stroke.  No headache.  No n/v/d.  No chest pain or sob.  Pt at baseline on arrival to treatment room 2 after her ct scan.  Pt alert, speech clear.  No facial droop.   Neurologist and ER md dr Larinda Buttery in with pt and Zollie Scale rn stroke coordinator.  Iv started.  Chaplin with pt too.  nsr on monitor.  Skin warm and dry.  Pt answering questions appro.

## 2020-02-03 NOTE — ED Provider Notes (Signed)
[facility] Emergency Department Provider Note   ____________________________________________   First MD Initiated Contact with Patient 02/03/20 1520     (approximate)  I have reviewed the triage vital signs and the nursing notes.   HISTORY  Chief Complaint Code Stroke    HPI Kara Pennington is a 79 y.o. female with past medical history of hypertension, GERD, and depression presents to the ED for possible stroke.  Patient states that she was outside of [facility] working with her physical therapist when she suddenly developed difficulty speaking.  She states it felt like it was hard for her to get her words out and is started right at 1:30 PM.  She denies any vision changes, extremity numbness or weakness.  Symptoms persisted for about 30 minutes but have now been gradually improving.  She states she was feeling hot working outside in the heat, but now feels back to normal.  She denies any associated chest pain, reports some mild shortness of breath which is typical for her.  She does not take any blood thinners.        Past Medical History:  Diagnosis Date  . Anxiety   . Depression   . GERD (gastroesophageal reflux disease)   . Hypertension     Patient Active Problem List   Diagnosis Date Noted  . Acute bronchitis 10/06/2016    Past Surgical History:  Procedure Laterality Date  . APPENDECTOMY    . PARATHYROIDECTOMY      Prior to Admission medications   Medication Sig Start Date End Date Taking? Authorizing Provider  albuterol (VENTOLIN HFA) 108 (90 Base) MCG/ACT inhaler Inhale 1 puff into the lungs every 6 (six) hours as needed. 07/07/19 07/06/20 Yes [provider]  DULoxetine (CYMBALTA) 30 MG capsule Take 1 capsule by mouth daily. 01/13/20 01/12/21 Yes [provider]  hydroxychloroquine (PLAQUENIL) 200 MG tablet Take 1 tablet by mouth daily. 04/07/19 04/06/20 Yes [provider]  lidocaine (LIDODERM) 5 % Place 1 patch  onto the skin daily. 01/19/20 02/18/20 Yes [provider]  metoprolol succinate (TOPROL-XL) 25 MG 24 hr tablet Take 1 tablet by mouth daily. 09/16/19 09/15/20 Yes [provider]  mirtazapine (REMERON) 15 MG tablet Take 22.5 tablets by mouth at bedtime.  01/13/20 02/12/20 Yes [provider]  pantoprazole (PROTONIX) 40 MG tablet Take 40 mg by mouth at bedtime.    Yes [provider]  traZODone (DESYREL) 50 MG tablet Take 100 mg by mouth at bedtime. 02/19/19  Yes [provider]  albuterol-ipratropium (COMBIVENT) 18-103 MCG/ACT inhaler Inhale 1 puff into the lungs daily as needed for wheezing.     [provider]  dextromethorphan (DELSYM) 30 MG/5ML liquid Take 5 mLs (30 mg total) by mouth 2 (two) times daily. Patient not taking: Reported on 09/10/2017 10/07/16   Adrian Saran, MD  loperamide (IMODIUM) 2 MG capsule Take 1 capsule by mouth daily as needed.    [provider]  menthol-cetylpyridinium (CEPACOL) 3 MG lozenge Take 1 lozenge (3 mg total) by mouth every 2 (two) hours as needed for sore throat. Patient not taking: Reported on 02/03/2020 10/07/16   Adrian Saran, MD    Allergies Amoxicillin-pot clavulanate and Celecoxib  No family history on file.  Social History Social History   Tobacco Use  . Smoking status: Never Smoker  . Smokeless tobacco: Never Used  Substance Use Topics  . Alcohol use: Yes    Comment: rarely  . Drug use: No  Review of Systems  Constitutional: No fever/chills Eyes: No visual changes. ENT: No sore throat. Cardiovascular: Denies chest pain. Respiratory: Denies shortness of breath. Gastrointestinal: No abdominal pain.  No nausea, no vomiting.  No diarrhea.  No constipation. Genitourinary: Negative for dysuria. Musculoskeletal: Negative for back pain. Skin: Negative for rash. Neurological: Negative for headaches, focal weakness or numbness.  Positive for difficulty  speaking.  ____________________________________________   PHYSICAL EXAM:  VITAL SIGNS: ED Triage Vitals  Enc Vitals Group     BP 02/03/20 1501 95/78     Pulse Rate 02/03/20 1501 68     Resp 02/03/20 1501 17     Temp --      Temp Source 02/03/20 1501 Oral     SpO2 02/03/20 1501 98 %     Weight 02/03/20 1502 174 lb (78.9 kg)     Height 02/03/20 1502 5\' 1"  (1.549 m)     Head Circumference --      Peak Flow --      Pain Score 02/03/20 1502 0     Pain Loc --      Pain Edu? --      Excl. in GC? --     Constitutional: Alert and oriented. Eyes: Conjunctivae are normal. Head: Atraumatic. Nose: No congestion/rhinnorhea. Mouth/Throat: Mucous membranes are moist. Neck: Normal ROM Cardiovascular: Normal rate, regular rhythm. Grossly normal heart sounds.  2+ radial pulses bilaterally. Respiratory: Normal respiratory effort.  No retractions. Lungs CTAB. Gastrointestinal: Soft and nontender. No distention. Genitourinary: deferred Musculoskeletal: No lower extremity tenderness nor edema. Neurologic:  Normal speech and language. No gross focal neurologic deficits are appreciated. Skin:  Skin is warm, dry and intact. No rash noted. Psychiatric: Mood and affect are normal. Speech and behavior are normal.  ____________________________________________   LABS (all labs ordered are listed, but only abnormal results are displayed)  Labs Reviewed  COMPREHENSIVE METABOLIC PANEL - Abnormal; Notable for the following components:      Result Value   Glucose, Bld 118 (*)    BUN 24 (*)    Creatinine, Ser 1.31 (*)    GFR calc non Af Amer 38 (*)    GFR calc Af Amer 44 (*)    All other components within normal limits  SARS CORONAVIRUS 2 BY RT PCR (HOSPITAL ORDER, PERFORMED IN [facility])  PROTIME-INR  APTT  CBC  DIFFERENTIAL  CK  CBG MONITORING, ED   ____________________________________________  EKG  ED ECG REPORT I, 02/05/20, the attending physician, personally  viewed and interpreted this ECG.   Date: 02/03/2020  EKG Time: 14:56  Rate: 70  Rhythm: normal sinus rhythm  Axis: Normal  Intervals:none  ST&T Change: None   PROCEDURES  Procedure(s) performed (including Critical Care):  Procedures   ____________________________________________   INITIAL IMPRESSION / ASSESSMENT AND PLAN / ED COURSE       79 year old female presents to the ED with acute onset slurred speech around 1:30 PM while she was working outside with physical therapist.  Code stroke was initially called and CT head is negative for acute process, although patient's symptoms now appear to have resolved.  She has no focal deficits on exam and NIH score is 0.  Neurology is at bedside evaluating the patient and lab work is pending, but heatstroke also remains on the differential given current temp outside of about 95 degrees.  Her initial temperature was 99.4 and we will hydrate with IV fluids, continue to monitor.  Lab work is reassuring, remarkable only  for mildly elevated creatinine.  Patient evaluated by neurology, who recommends admission for further stroke work-up.  Case discussed with hospitalist for admission.      ____________________________________________   FINAL CLINICAL IMPRESSION(S) / ED DIAGNOSES  Final diagnoses:  TIA (transient ischemic attack)  Aphasia     ED Discharge Orders    None       Note:  This document was prepared using Dragon voice recognition software and may include unintentional dictation errors.   Chesley Noon, MD 02/03/20 [phone]

## 2020-02-03 NOTE — ED Triage Notes (Signed)
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 Pt states she feels normal now, states it was very strange earlier while she was having sx, states she knew what she wanted to say but is was not coming out that way. Denies having any pain, dizziness, numbness or other sx at present.

## 2020-02-03 NOTE — ED Notes (Signed)
Pt transported to MRI 

## 2020-02-03 NOTE — Progress Notes (Signed)
CH paged to ED for Code Stroke; pt. in rm. sitting up in bed being evaluated by RN when [facility] arrived; [facility] remained present in rm. while RN, Emergency MD, and neurologist performed evaluations and then introduced himself to pt.   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  Pt. lives at [facility] in an independent living apartment.  She had COVID this past winter and was brought to hospital near where her son lives outside [place] for treatment and recovery; pt. discharged to rehab facility for 66mo following hospitalization for COVID and then was discharged home and has been working with PT to 'relearn how to breathe and how to walk'.  Today was last PT session and PT was having her work on putting walker in her car; in the intense heat outside, pt. shared she began to have difficulty speaking and putting her thoughts into words.  Although pt. said she feels better now in ED, during conversation, CH noticed slight hesitations in pt.'s speech, and pt. said that she was experiencing some difficulty getting her words out.  Pt. says she feels slightly worried, but overall relatively at peace; she expressed willingness to undergo tests to see if stroke may be involved in symptoms.  When [facility] left, pt.'s dtr. enroute from [facility].  CH plans to follow up later this evening, but pt. appears to be coping effectively at present, awaiting MRI.

## 2020-02-03 NOTE — Progress Notes (Signed)
CH visited pt. as follow-up to visit this afternoon; pt. sitting up in bed w/dtr. Cindi Carbon at bedside.  Pt. shared RN said plan is to keep pt. for observation overnight.  At pt..'dtr. request, CH got a second heated blanket for pt. and passed along request for assistance going to bathroom to RN.  No further needs expressed at this time; pt. appears well supported by dtr.  CH remains available as needed.    02/03/20 1813  Clinical Encounter Type  Visited With Patient and family together  Visit Type Follow-up;Psychological support;Social support;ED  Spiritual Encounters  Spiritual Needs Emotional  Stress Factors  Patient Stress Factors Health changes  Family Stress Factors Health changes

## 2020-02-03 NOTE — ED Notes (Signed)
Family with pt  Pt alert and oriented.  Iv fluids infusing.

## 2020-02-03 NOTE — Consult Note (Signed)
Requesting Physician: Dr. Larinda Buttery    Chief Complaint: word finding difficulty    HPI: Kara Pennington is an 79 y.o. female with hx of recent covid infection sent from [facility] for transient aphasia. Pt states she was outside and sweaty at which time she had difficulty expressing herself and trouble getting words out. No other clear focality on examination. Once pt arrived she has improved and currently seems back to baseline.    Date last known well: Date: 02/03/2020 Time last known well: Time: 13:30 tPA Given: No: as improved and close to baseline.   Past Medical History:  Diagnosis Date  . Anxiety   . Depression   . GERD (gastroesophageal reflux disease)   . Hypertension     Past Surgical History:  Procedure Laterality Date  . APPENDECTOMY    . PARATHYROIDECTOMY      No family history on file. Social History:  reports that she has never smoked. She has never used smokeless tobacco. She reports current alcohol use. She reports that she does not use drugs.  Allergies:  Allergies  Allergen Reactions  . Amoxicillin-Pot Clavulanate Hives and Rash  . Celecoxib Other (See Comments) and Rash    Other Reaction: Not Assessed Other Reaction: Not Assessed     Medications: I have reviewed the patient's current medications.  ROS: History obtained from the patient  General ROS: negative for - chills, fatigue, fever, night sweats, weight gain or weight loss Psychological ROS: negative for - behavioral disorder, hallucinations, memory difficulties, mood swings or suicidal ideation Ophthalmic ROS: negative for - blurry vision, double vision, eye pain or loss of vision ENT ROS: negative for - epistaxis, nasal discharge, oral lesions, sore throat, tinnitus or vertigo Allergy and Immunology ROS: negative for - hives or itchy/watery eyes Hematological and Lymphatic ROS: negative for - bleeding problems, bruising or swollen lymph nodes Endocrine ROS: negative for - galactorrhea, hair  pattern changes, polydipsia/polyuria or temperature intolerance Respiratory ROS: negative for - cough, hemoptysis, shortness of breath or wheezing Cardiovascular ROS: negative for - chest pain, dyspnea on exertion, edema or irregular heartbeat Gastrointestinal ROS: negative for - abdominal pain, diarrhea, hematemesis, nausea/vomiting or stool incontinence Genito-Urinary ROS: negative for - dysuria, hematuria, incontinence or urinary frequency/urgency Musculoskeletal ROS: negative for - joint swelling or muscular weakness Neurological ROS: as noted in HPI Dermatological ROS: negative for rash and skin lesion changes  Physical Examination: Blood pressure 95/78, pulse 68, temperature 99.4 F (37.4 C), resp. rate 17, height 5\' 1"  (1.549 m), weight 78.9 kg, SpO2 98 %.   Neurological Examination   Mental Status: Alert, oriented, thought content appropriate.  Speech fluent without evidence of aphasia.  Able to follow 3 step commands without difficulty. Cranial Nerves: II: Discs flat bilaterally; Visual fields grossly normal, pupils equal, round, reactive to light and accommodation III,IV, VI: ptosis not present, extra-ocular motions intact bilaterally V,VII: smile symmetric, facial light touch sensation normal bilaterally VIII: hearing normal bilaterally IX,X: gag reflex present XI: bilateral shoulder shrug XII: midline tongue extension Motor: Right : Upper extremity   5/5    Left:     Upper extremity   5/5  Lower extremity   5/5     Lower extremity   5/5 Tone and bulk:normal tone throughout; no atrophy noted Sensory: Pinprick and light touch intact throughout, bilaterally Deep Tendon Reflexes: 2+ and symmetric throughout Plantars: Right: downgoing   Left: downgoing Cerebellar: normal finger-to-nose, normal rapid alternating movements and normal heel-to-shin test Gait: not tested  Laboratory Studies:  Basic Metabolic Panel: Recent Labs  Lab 02/03/20 1506  NA 137  K 4.1  CL  103  CO2 26  GLUCOSE 118*  BUN 24*  CREATININE 1.31*  CALCIUM 9.4    Liver Function Tests: Recent Labs  Lab 02/03/20 1506  AST 34  ALT 22  ALKPHOS 60  BILITOT 1.0  PROT 6.6  ALBUMIN 3.7   No results for input(s): LIPASE, AMYLASE in the last 168 hours. No results for input(s): AMMONIA in the last 168 hours.  CBC: Recent Labs  Lab 02/03/20 1506  WBC 6.5  NEUTROABS 4.1  HGB 13.4  HCT 40.1  MCV 96.4  PLT 177    Cardiac Enzymes: No results for input(s): CKTOTAL, CKMB, CKMBINDEX, TROPONINI in the last 168 hours.  BNP: Invalid input(s): POCBNP  CBG: No results for input(s): GLUCAP in the last 168 hours.  Microbiology: Results for orders placed or performed during the hospital encounter of 10/05/16  C difficile quick scan w PCR reflex     Status: None   Collection Time: 10/06/16  2:45 AM   Specimen: Stool  Result Value Ref Range Status   C Diff antigen NEGATIVE NEGATIVE Final   C Diff toxin NEGATIVE NEGATIVE Final   C Diff interpretation No C. difficile detected.  Final  Gastrointestinal Panel by PCR , Stool     Status: None   Collection Time: 10/06/16  2:45 AM  Result Value Ref Range Status   Campylobacter species NOT DETECTED NOT DETECTED Final   Plesimonas shigelloides NOT DETECTED NOT DETECTED Final   Salmonella species NOT DETECTED NOT DETECTED Final   Yersinia enterocolitica NOT DETECTED NOT DETECTED Final   Vibrio species NOT DETECTED NOT DETECTED Final   Vibrio cholerae NOT DETECTED NOT DETECTED Final   Enteroaggregative E coli (EAEC) NOT DETECTED NOT DETECTED Final   Enteropathogenic E coli (EPEC) NOT DETECTED NOT DETECTED Final   Enterotoxigenic E coli (ETEC) NOT DETECTED NOT DETECTED Final   Shiga like toxin producing E coli (STEC) NOT DETECTED NOT DETECTED Final   Shigella/Enteroinvasive E coli (EIEC) NOT DETECTED NOT DETECTED Final   Cryptosporidium NOT DETECTED NOT DETECTED Final   Cyclospora cayetanensis NOT DETECTED NOT DETECTED Final    Entamoeba histolytica NOT DETECTED NOT DETECTED Final   Giardia lamblia NOT DETECTED NOT DETECTED Final   Adenovirus F40/41 NOT DETECTED NOT DETECTED Final   Astrovirus NOT DETECTED NOT DETECTED Final   Norovirus GI/GII NOT DETECTED NOT DETECTED Final   Rotavirus A NOT DETECTED NOT DETECTED Final   Sapovirus (I, II, IV, and V) NOT DETECTED NOT DETECTED Final  Culture, sputum-assessment     Status: None   Collection Time: 10/06/16  9:41 PM   Specimen: Expectorated Sputum  Result Value Ref Range Status   Specimen Description EXPECTORATED SPUTUM  Final   Special Requests Normal  Final   Sputum evaluation THIS SPECIMEN IS ACCEPTABLE FOR SPUTUM CULTURE  Final   Report Status 10/06/2016 FINAL  Final  Culture, respiratory (NON-Expectorated)     Status: None   Collection Time: 10/06/16  9:41 PM  Result Value Ref Range Status   Specimen Description EXPECTORATED SPUTUM  Final   Special Requests Normal Reflexed from [id number]  Final   Gram Stain   Final    MODERATE WBC PRESENT,BOTH PMN AND MONONUCLEAR FEW GRAM POSITIVE COCCI RARE GRAM POSITIVE RODS    Culture   Final    Consistent with normal respiratory flora. Performed at [facility], [address] [place], [place] [address]    Report Status 10/09/2016 FINAL  Final    Coagulation Studies: Recent Labs    02/03/20 1506  LABPROT 13.3  INR 1.1    Urinalysis: No results for input(s): COLORURINE, LABSPEC, PHURINE, GLUCOSEU, HGBUR, BILIRUBINUR, KETONESUR, PROTEINUR, UROBILINOGEN, NITRITE, LEUKOCYTESUR in the last 168 hours.  Invalid input(s): APPERANCEUR  Lipid Panel:    Component Value Date/Time   CHOL 119 10/06/2016 0839   TRIG 49 10/06/2016 0839   HDL 45 10/06/2016 0839   CHOLHDL 2.6 10/06/2016 0839   VLDL 10 10/06/2016 0839   LDLCALC 64 10/06/2016 0839    HgbA1C: No results found for: HGBA1C  Urine Drug Screen:      Component Value Date/Time   LABOPIA NEGATIVE 11/24/2012 2239   COCAINSCRNUR NEGATIVE 11/24/2012  2239   LABBENZ NEGATIVE 11/24/2012 2239   AMPHETMU NEGATIVE 11/24/2012 2239   THCU NEGATIVE 11/24/2012 2239   LABBARB NEGATIVE 11/24/2012 2239    Alcohol Level: No results for input(s): ETH in the last 168 hours.  Other results: EKG: normal EKG, normal sinus rhythm, unchanged from previous tracings.  Imaging: CT HEAD CODE STROKE WO CONTRAST  Result Date: 02/03/2020 CLINICAL DATA:  Code stroke. Sudden onset of speech disturbance 1330 hours. EXAM: CT HEAD WITHOUT CONTRAST TECHNIQUE: Contiguous axial images were obtained from the base of the skull through the vertex without intravenous contrast. COMPARISON:  01/24/2010 FINDINGS: Brain: Age related volume loss. No focal abnormality is seen affecting the brainstem or cerebellum. Cerebral hemispheres show chronic appearing small vessel ischemic changes of the thalami left more than right and of the cerebral hemispheric white matter. No sign of acute infarction, mass lesion, hemorrhage, hydrocephalus or extra-axial collection. Vascular: There is atherosclerotic calcification of the major vessels at the base of the brain. Skull: Negative Sinuses/Orbits: Clear/normal Other: None ASPECTS (Alberta Stroke Program Early CT Score) - Ganglionic level infarction (caudate, lentiform nuclei, internal capsule, insula, M1-M3 cortex): 7 - Supraganglionic infarction (M4-M6 cortex): 3 Total score (0-10 with 10 being normal): 10 IMPRESSION: 1. No acute CT finding. Atrophy and chronic small-vessel ischemic changes as above. 2. ASPECTS is 10 3. These results were called by telephone at the time of interpretation on 02/03/2020 at 3:18 pm to provider Dr. Larinda Buttery, who verbally acknowledged these results. Electronically Signed   By: Paulina Fusi M.D.   On: 02/03/2020 15:20    Assessment: 79 y.o. female with hx of recent covid infection sent from [facility] for transient aphasia. Pt states she was outside and sweaty at which time she had difficulty expressing herself and trouble  getting words out. No other clear focality on examination. Once pt arrived she has improved and currently seems back to baseline.    CTH no acute abnormalities. TPA not given as improved and back to baseline.   TIA vs dehydration heat stroke symptoms.  Plan: 1. HgbA1c, fasting lipid panel 2. MRI, MRA  of the brain without contrast 3. PT consult, OT consult, Speech consult 4. Echocardiogram 5. Carotid dopplers 6. Prophylactic therapy-Antiplatelet med: Aspirin - dose 81mg  daily  7. NPO until RN stroke swallow screen 8. Telemetry monitoring 9. Frequent neuro checks 10 Hydration

## 2020-02-03 NOTE — H&P (Signed)
History and Physical    Kara Pennington DXI:[id number] DOB: 03-17-1941 DOA: 02/03/2020  PCP: Woodroe Chen, MD Patient coming from:   Chief Complaint: Could not speak HPI: Kara Pennington is a 79 y.o. female with medical history significant of hypertension sent in from [facility] with aphasia.  Patient thinks she was outside for about 30 minutes working with physical therapy.  She was diaphoretic and was brought inside the house when she started to become aphasic/dysarthric.  Denied any headaches swallowing difficulties changes with vision or prior previous episodes.  She does have history of hypertension.  Denies nausea vomiting chest pain shortness of breath cough fever chills.  Denies urinary complaints abdominal pain.  She had Covid in December and was in and out of hospital for a total of 7 weeks. Her symptoms resolved in the ER.   ED Course: Patient received IV fluids.  Code stroke was called.  Seen by neurology.  Review of Systems: As per HPI otherwise all other systems reviewed and are negative  Ambulatory Status: Ambulatory at baseline.  Past Medical History:  Diagnosis Date   Anxiety    Depression    GERD (gastroesophageal reflux disease)    Hypertension     Past Surgical History:  Procedure Laterality Date   APPENDECTOMY     PARATHYROIDECTOMY      Social History   Socioeconomic History   Marital status: Divorced    Spouse name: Not on file   Number of children: Not on file   Years of education: Not on file   Highest education level: Not on file  Occupational History   Not on file  Tobacco Use   Smoking status: Never Smoker   Smokeless tobacco: Never Used  Substance and Sexual Activity   Alcohol use: Yes    Comment: rarely   Drug use: No   Sexual activity: Not on file  Other Topics Concern   Not on file  Social History Narrative   Not on file   Social Determinants of Health   Financial Resource Strain:    Difficulty of  Paying Living Expenses:   Food Insecurity:    Worried About Programme researcher, broadcasting/film/video in the Last Year:    Barista in the Last Year:   Transportation Needs:    Freight forwarder (Medical):    Lack of Transportation (Non-Medical):   Physical Activity:    Days of Exercise per Week:    Minutes of Exercise per Session:   Stress:    Feeling of Stress :   Social Connections:    Frequency of Communication with Friends and Family:    Frequency of Social Gatherings with Friends and Family:    Attends Religious Services:    Active Member of Clubs or Organizations:    Attends Banker Meetings:    Marital Status:   Intimate Partner Violence:    Fear of Current or Ex-Partner:    Emotionally Abused:    Physically Abused:    Sexually Abused:     Allergies  Allergen Reactions   Amoxicillin-Pot Clavulanate Hives and Rash   Celecoxib Other (See Comments) and Rash    Other Reaction: Not Assessed Other Reaction: Not Assessed     No family history on file.  Significant for type 2 diabetes.  Prior to Admission medications   Medication Sig Start Date End Date Taking? Authorizing Provider  albuterol (VENTOLIN HFA) 108 (90 Base) MCG/ACT inhaler Inhale 1 puff into the lungs  every 6 (six) hours as needed. 07/07/19 07/06/20 Yes [provider]  DULoxetine (CYMBALTA) 30 MG capsule Take 1 capsule by mouth daily. 01/13/20 01/12/21 Yes [provider]  hydroxychloroquine (PLAQUENIL) 200 MG tablet Take 1 tablet by mouth daily. 04/07/19 04/06/20 Yes [provider]  lidocaine (LIDODERM) 5 % Place 1 patch onto the skin daily. 01/19/20 02/18/20 Yes [provider]  metoprolol succinate (TOPROL-XL) 25 MG 24 hr tablet Take 1 tablet by mouth daily. 09/16/19 09/15/20 Yes [provider]  mirtazapine (REMERON) 15 MG tablet Take 22.5 tablets by mouth at bedtime.  01/13/20 02/12/20 Yes [provider]  pantoprazole (PROTONIX) 40 MG  tablet Take 40 mg by mouth at bedtime.    Yes [provider]  traZODone (DESYREL) 50 MG tablet Take 100 mg by mouth at bedtime. 02/19/19  Yes [provider]  albuterol-ipratropium (COMBIVENT) 18-103 MCG/ACT inhaler Inhale 1 puff into the lungs daily as needed for wheezing.     [provider]  dextromethorphan (DELSYM) 30 MG/5ML liquid Take 5 mLs (30 mg total) by mouth 2 (two) times daily. Patient not taking: Reported on 09/10/2017 10/07/16   Adrian Saran, MD  loperamide (IMODIUM) 2 MG capsule Take 1 capsule by mouth daily as needed.    [provider]  menthol-cetylpyridinium (CEPACOL) 3 MG lozenge Take 1 lozenge (3 mg total) by mouth every 2 (two) hours as needed for sore throat. Patient not taking: Reported on 02/03/2020 10/07/16   Adrian Saran, MD    Physical Exam: Patient resting in bed no acute distress daughter by the bedside. Vitals:   02/03/20 1558 02/03/20 1559 02/03/20 1600 02/03/20 1601  BP:   (!) 166/80   Pulse:      Resp: 23 19 19 22   Temp:      TempSrc:      SpO2:      Weight:      Height:          General: Appears calm and comfortable  Eyes:  PERRL, EOMI, normal lids, iris  ENT:  grossly normal hearing, lips & tongue, mmm  Neck:  no LAD, masses or thyromegaly  Cardiovascular: RRR, no m/r/g. No LE edema.   Respiratory: CTA bilaterally, no w/r/r. Normal respiratory effort.  Abdomen:  soft, ntnd, NABS  Skin:  no rash or induration seen on limited exam  Musculoskeletal:  grossly normal tone BUE/BLE, good ROM, no bony abnormality  Psychiatric:  grossly normal mood and affect, speech fluent and appropriate, AOx3  Neurologic:  CN 2-12 grossly intact, moves all extremities in coordinated fashion, sensation intact  Labs on Admission: I have personally reviewed following labs and imaging studies  CBC: Recent Labs  Lab 02/03/20 1506  WBC 6.5  NEUTROABS 4.1  HGB 13.4  HCT 40.1  MCV 96.4  PLT 177   Basic Metabolic  Panel: Recent Labs  Lab 02/03/20 1506  NA 137  K 4.1  CL 103  CO2 26  GLUCOSE 118*  BUN 24*  CREATININE 1.31*  CALCIUM 9.4   GFR: Estimated Creatinine Clearance: 32 mL/min (A) (by C-G formula based on SCr of 1.31 mg/dL (H)). Liver Function Tests: Recent Labs  Lab 02/03/20 1506  AST 34  ALT 22  ALKPHOS 60  BILITOT 1.0  PROT 6.6  ALBUMIN 3.7   No results for input(s): LIPASE, AMYLASE in the last 168 hours. No results for input(s): AMMONIA in the last 168 hours. Coagulation Profile: Recent Labs  Lab 02/03/20 1506  INR 1.1  Cardiac Enzymes: Recent Labs  Lab 02/03/20 1506  CKTOTAL 57   BNP (last 3 results) No results for input(s): PROBNP in the last 8760 hours. HbA1C: No results for input(s): HGBA1C in the last 72 hours. CBG: No results for input(s): GLUCAP in the last 168 hours. Lipid Profile: No results for input(s): CHOL, HDL, LDLCALC, TRIG, CHOLHDL, LDLDIRECT in the last 72 hours. Thyroid Function Tests: No results for input(s): TSH, T4TOTAL, FREET4, T3FREE, THYROIDAB in the last 72 hours. Anemia Panel: No results for input(s): VITAMINB12, FOLATE, FERRITIN, TIBC, IRON, RETICCTPCT in the last 72 hours. Urine analysis:    Component Value Date/Time   COLORURINE YELLOW (A) 10/06/2016 0759   APPEARANCEUR CLEAR (A) 10/06/2016 0759   APPEARANCEUR Hazy 11/24/2012 2239   LABSPEC 1.024 10/06/2016 0759   LABSPEC 1.018 11/24/2012 2239   PHURINE 5.0 10/06/2016 0759   GLUCOSEU NEGATIVE 10/06/2016 0759   GLUCOSEU Negative 11/24/2012 2239   HGBUR SMALL (A) 10/06/2016 0759   BILIRUBINUR NEGATIVE 10/06/2016 0759   BILIRUBINUR Negative 11/24/2012 2239   KETONESUR NEGATIVE 10/06/2016 0759   PROTEINUR NEGATIVE 10/06/2016 0759   NITRITE NEGATIVE 10/06/2016 0759   LEUKOCYTESUR NEGATIVE 10/06/2016 0759   LEUKOCYTESUR 1+ 11/24/2012 2239    Creatinine Clearance: Estimated Creatinine Clearance: 32 mL/min (A) (by C-G formula based on SCr of 1.31 mg/dL (H)).  Sepsis  Labs: @LABRCNTIP (procalcitonin:4,lacticidven:4) )No results found for this or any previous visit (from the past 240 hour(s)).   Radiological Exams on Admission: CT HEAD CODE STROKE WO CONTRAST  Result Date: 02/03/2020 CLINICAL DATA:  Code stroke. Sudden onset of speech disturbance 1330 hours. EXAM: CT HEAD WITHOUT CONTRAST TECHNIQUE: Contiguous axial images were obtained from the base of the skull through the vertex without intravenous contrast. COMPARISON:  01/24/2010 FINDINGS: Brain: Age related volume loss. No focal abnormality is seen affecting the brainstem or cerebellum. Cerebral hemispheres show chronic appearing small vessel ischemic changes of the thalami left more than right and of the cerebral hemispheric white matter. No sign of acute infarction, mass lesion, hemorrhage, hydrocephalus or extra-axial collection. Vascular: There is atherosclerotic calcification of the major vessels at the base of the brain. Skull: Negative Sinuses/Orbits: Clear/normal Other: None ASPECTS (Alberta Stroke Program Early CT Score) - Ganglionic level infarction (caudate, lentiform nuclei, internal capsule, insula, M1-M3 cortex): 7 - Supraganglionic infarction (M4-M6 cortex): 3 Total score (0-10 with 10 being normal): 10 IMPRESSION: 1. No acute CT finding. Atrophy and chronic small-vessel ischemic changes as above. 2. ASPECTS is 10 3. These results were called by telephone at the time of interpretation on 02/03/2020 at 3:18 pm to provider Dr. 02/05/2020, who verbally acknowledged these results. Electronically Signed   By: Larinda Buttery M.D.   On: 02/03/2020 15:20    EKG: Independently reviewed- NSR Assessment/Plan Active Problems:   * No active hospital problems. *    #1  Dehydration normal saline 100 cc an hour.  #2  Possible TIA-stroke work-up ordered. PT OT and speech therapy consult Echo and carotid ultrasound Hemoglobin A1c and lipid panel MRI/MRA of the brain On aspirin 81 mg daily Frequent neuro  checks CT head atrophy and chronic small vessel ischemic changes.  #3 AKI due to dehydration.  Start normal saline 1 follow-up labs in a.m.  #4 hypertension restart metoprolol.  Severity of Illness: The appropriate patient status for this patient is OBSERVATION. Observation status is judged to be reasonable and necessary in order to provide the required intensity of service to ensure the patient's safety. The patient's presenting symptoms, physical  exam findings, and initial radiographic and laboratory data in the context of their medical condition is felt to place them at decreased risk for further clinical deterioration. Furthermore, it is anticipated that the patient will be medically stable for discharge from the hospital within 2 midnights of admission. The following factors support the patient status of observation.   " The patient's presenting symptoms include dizziness and aphasia. " The physical exam findings include dry oral mucosa " The initial radiographic and laboratory data are elevated creatinine    Estimated body mass index is 32.88 kg/m as calculated from the following:   Height as of this encounter: 5\' 1"  (1.549 m).   Weight as of this encounter: 78.9 kg.   DVT prophylaxis: Lovenox Code Status: Full code Family Communication: Discussed with daughter at bedside Disposition Plan pending stroke work-up Consults called: Neurology Admission status: Observation   Alwyn RenElizabeth G Wilfredo Canterbury MD [facility]  If 7PM-7AM, please contact night-coverage [email or web address] Password [email or web address]  02/03/2020, 5:33 PM

## 2020-02-04 ENCOUNTER — Observation Stay
Admit: 2020-02-04 | Discharge: 2020-02-04 | Disposition: A | Discharge status: Home / Self Care | Payer: Medicare Other | Attending: Internal Medicine | Admitting: Internal Medicine

## 2020-02-04 DIAGNOSIS — G459 Transient cerebral ischemic attack, unspecified: Secondary | ICD-10-CM | POA: Diagnosis not present

## 2020-02-04 DIAGNOSIS — R4701 Aphasia: Secondary | ICD-10-CM | POA: Diagnosis not present

## 2020-02-04 LAB — CBC
HCT: 34 % — ABNORMAL LOW (ref 36.0–46.0)
Hemoglobin: 11.9 g/dL — ABNORMAL LOW (ref 12.0–15.0)
MCH: 33 pg (ref 26.0–34.0)
MCHC: 35 g/dL (ref 30.0–36.0)
MCV: 94.2 fL (ref 80.0–100.0)
Platelets: 136 10*3/uL — ABNORMAL LOW (ref 150–400)
RBC: 3.61 MIL/uL — ABNORMAL LOW (ref 3.87–5.11)
RDW: 13.9 % (ref 11.5–15.5)
WBC: 5.7 10*3/uL (ref 4.0–10.5)
nRBC: 0 % (ref 0.0–0.2)

## 2020-02-04 LAB — LIPID PANEL
Cholesterol: 143 mg/dL (ref 0–200)
HDL: 48 mg/dL (ref >40)
LDL Cholesterol: 82 mg/dL (ref 0–99)
Total CHOL/HDL Ratio: 3 RATIO
Triglycerides: 67 mg/dL (ref <150)
VLDL: 13 mg/dL (ref 0–40)

## 2020-02-04 LAB — ECHOCARDIOGRAM COMPLETE
AR max vel: 3.32 cm2
AV Area VTI: 3.39 cm2
AV Area mean vel: 3.31 cm2
AV Mean grad: 3 mmHg
AV Peak grad: 5.6 mmHg
Ao pk vel: 1.18 m/s
Area-P 1/2: 3.06 cm2
Height: 61 in
S' Lateral: 3.47 cm
Weight: 2784 oz

## 2020-02-04 LAB — COMPREHENSIVE METABOLIC PANEL
ALT: 19 U/L (ref 0–44)
AST: 31 U/L (ref 15–41)
Albumin: 3.2 g/dL — ABNORMAL LOW (ref 3.5–5.0)
Alkaline Phosphatase: 66 U/L (ref 38–126)
Anion gap: 6 (ref 5–15)
BUN: 23 mg/dL (ref 8–23)
CO2: 27 mmol/L (ref 22–32)
Calcium: 8.2 mg/dL — ABNORMAL LOW (ref 8.9–10.3)
Chloride: 107 mmol/L (ref 98–111)
Creatinine, Ser: 1.01 mg/dL — ABNORMAL HIGH (ref 0.44–1.00)
GFR calc Af Amer: >60 mL/min (ref >60)
GFR calc non Af Amer: 52 mL/min — ABNORMAL LOW (ref >60)
Glucose, Bld: 87 mg/dL (ref 70–99)
Potassium: 3.9 mmol/L (ref 3.5–5.1)
Sodium: 140 mmol/L (ref 135–145)
Total Bilirubin: 0.8 mg/dL (ref 0.3–1.2)
Total Protein: 5.2 g/dL — ABNORMAL LOW (ref 6.5–8.1)

## 2020-02-04 LAB — HEMOGLOBIN A1C
Hgb A1c MFr Bld: 5.2 % (ref 4.8–5.6)
Mean Plasma Glucose: 102.54 mg/dL

## 2020-02-04 LAB — TSH: TSH: 5.578 u[IU]/mL — ABNORMAL HIGH (ref 0.350–4.500)

## 2020-02-04 MED ORDER — ASPIRIN 81 MG PO TBEC: 81.0000 mg | DELAYED_RELEASE_TABLET | Freq: Every day | ORAL | 11 refills | Status: DC

## 2020-02-04 NOTE — Discharge Summary (Signed)
Physician Discharge Summary  [name]:[id number] DOB: 08/27/1940 DOA: 02/03/2020  PCP: Woodroe Chen, MD  Admit date: 02/03/2020 Discharge date: 02/04/2020  Admitted From: Independent living Disposition: Independent living Recommendations for Outpatient Follow-up:  1. Follow up with PCP in 1-2 weeks-patient is on Remeron Cymbalta and trazodone try to taper at least 1 to stop. 2. Please obtain BMP/CBC in one week  Home Health none Equipment/Devices: None Discharge Condition: Stable and improved CODE STATUS: Full code Diet recommendation: Cardiac diet Brief/Interim Summary:Kara Pennington is a 79 y.o. female with medical history significant of hypertension sent in from [facility] with aphasia.  Patient thinks she was outside for about 30 minutes working with physical therapy.  She was diaphoretic and was brought inside the house when she started to become aphasic/dysarthric.  Denied any headaches swallowing difficulties changes with vision or prior previous episodes.  She does have history of hypertension.  Denies nausea vomiting chest pain shortness of breath cough fever chills.  Denies urinary complaints abdominal pain.  She had Covid in December and was in and out of hospital for a total of 7 weeks. Her symptoms resolved in the ER.  Discharge Diagnoses:  Active Problems:   TIA (transient ischemic attack)   #1 dehydration treated with IV fluids  #2 possible heatstroke patient was initially admitted with a diagnosis of TIA and a complete stroke work-up was done.  Her symptoms had already resolved by the time I saw her in the emergency room.  MRI/MRA of the brain with no acute changes, echo with normal ejection fraction, Seen by neurology recommended to continue aspirin on discharge. Patient was also seen by physical therapy and speech therapy with no new recommendations. Hemoglobin A1c was 5.2. Mildly elevated TSH 5. 5 repeat labs in 6 weeks.  #3 history of essential  hypertension continue home meds  #4 AKI due to dehydration resolved with IV fluids.  Creatinine 1.01 on discharge.  Estimated body mass index is 32.88 kg/m as calculated from the following:   Height as of this encounter: 5\' 1"  (1.549 m).   Weight as of this encounter: 78.9 kg.  Discharge Instructions   Allergies as of 02/04/2020      Reactions   Amoxicillin-pot Clavulanate Hives, Rash   Celecoxib Other (See Comments), Rash   Other Reaction: Not Assessed Other Reaction: Not Assessed      Medication List    STOP taking these medications   dextromethorphan 30 MG/5ML liquid Commonly known as: DELSYM     TAKE these medications   albuterol 108 (90 Base) MCG/ACT inhaler Commonly known as: VENTOLIN HFA Inhale 1 puff into the lungs every 6 (six) hours as needed.   albuterol-ipratropium 18-103 MCG/ACT inhaler Commonly known as: COMBIVENT Inhale 1 puff into the lungs daily as needed for wheezing.   aspirin 81 MG EC tablet Take 1 tablet (81 mg total) by mouth daily. Swallow whole. Start taking on: February 05, 2020   DULoxetine 30 MG capsule Commonly known as: CYMBALTA Take 1 capsule by mouth daily.   hydroxychloroquine 200 MG tablet Commonly known as: PLAQUENIL Take 1 tablet by mouth daily.   lidocaine 5 % Commonly known as: LIDODERM Place 1 patch onto the skin daily.   loperamide 2 MG capsule Commonly known as: IMODIUM Take 1 capsule by mouth daily as needed.   menthol-cetylpyridinium 3 MG lozenge Commonly known as: CEPACOL Take 1 lozenge (3 mg total) by mouth every 2 (two) hours as needed for sore throat.  metoprolol succinate 25 MG 24 hr tablet Commonly known as: TOPROL-XL Take 1 tablet by mouth daily.   mirtazapine 15 MG tablet Commonly known as: REMERON Take 22.5 tablets by mouth at bedtime.   pantoprazole 40 MG tablet Commonly known as: PROTONIX Take 40 mg by mouth at bedtime.   traZODone 50 MG tablet Commonly known as: DESYREL Take 100 mg by mouth at  bedtime.       Allergies  Allergen Reactions  . Amoxicillin-Pot Clavulanate Hives and Rash  . Celecoxib Other (See Comments) and Rash    Other Reaction: Not Assessed Other Reaction: Not Assessed     Consultations:  Neurology   Procedures/Studies: MR ANGIO HEAD WO CONTRAST  Result Date: 02/03/2020 CLINICAL DATA:  Initial evaluation for sudden onset speech difficulty, now resolved. EXAM: MRI HEAD WITHOUT CONTRAST MRA HEAD WITHOUT CONTRAST MRA NECK WITHOUT AND WITH CONTRAST TECHNIQUE: Multiplanar, multiecho pulse sequences of the brain and surrounding structures were obtained without intravenous contrast. Angiographic images of the Circle of Willis were obtained using MRA technique without intravenous contrast. Angiographic images of the neck were obtained using MRA technique without and with intravenous contrast. Carotid stenosis measurements (when applicable) are obtained utilizing NASCET criteria, using the distal internal carotid diameter as the denominator. CONTRAST:  7mL GADAVIST GADOBUTROL 1 MMOL/ML IV SOLN COMPARISON:  Prior head CT from earlier the same day. FINDINGS: MRI HEAD FINDINGS Brain: Generalized age-related cerebral volume loss. Patchy T2/FLAIR hyperintensity seen within the periventricular and deep white matter both cerebral hemispheres, most consistent with chronic small vessel ischemic disease, moderate in nature. Patchy involvement of the deep gray nuclei and pons noted as well. No abnormal foci of restricted diffusion to suggest acute or subacute ischemia. Gray-white matter differentiation maintained. No encephalomalacia to suggest chronic cortical infarction. No evidence for acute intracranial hemorrhage. Few small punctate chronic micro hemorrhages noted about the right thalamus, likely small vessel related. No mass lesion, midline shift or mass effect. No hydrocephalus or extra-axial fluid collection. Incidental note made of an empty sella. Midline structures intact.  Vascular: Major intracranial vascular flow voids well maintained and normal in appearance. Skull and upper cervical spine: Craniocervical junction within normal limits. Bone marrow signal intensity normal. No scalp soft tissue abnormality. Sinuses/Orbits: Globes and orbital soft tissues within normal limits. Paranasal sinuses are largely clear. No significant mastoid effusion. Inner ear structures grossly normal. Other: None. MRA HEAD FINDINGS ANTERIOR CIRCULATION: Examination mildly degraded by motion artifact. Visualized distal cervical segments of the internal carotid arteries are widely patent with symmetric antegrade flow. Petrous, cavernous, supraclinoid ICAs patent without stenosis or other abnormality. A1 segments widely patent. Normal anterior communicating artery complex. Anterior cerebral arteries patent to their distal aspects without flow-limiting stenosis. Apparent tiny focal outpouching extending from the proximal A2 segments on time-of-flight sequence favored to reflect a small branch vessel (series 9, image 138). No M1 stenosis or occlusion. Normal MCA bifurcations. Distal MCA branches well perfused and symmetric. POSTERIOR CIRCULATION: Vertebral arteries patent to the vertebrobasilar junction without stenosis. Right vertebral artery slightly dominant. Patent left PICA. Right PICA not well seen. Basilar patent to its distal aspect without stenosis. Superior cerebral arteries patent bilaterally. Right PCA supplied via the basilar. Fetal type origin of the left PCA noted. Both PCAs well perfused to their distal aspects without stenosis. No intracranial aneurysm or other vascular abnormality. MRA NECK FINDINGS AORTIC ARCH: Visualized aortic arch of normal caliber with normal branch pattern. No flow-limiting stenosis seen about the origin of the great vessels. RIGHT CAROTID  SYSTEM: Right CCA tortuous proximally but widely patent to the bifurcation without stenosis. No significant atheromatous narrowing  about the right bifurcation. Right ICA widely patent distally to the circle-of-Willis without stenosis, dissection or occlusion. LEFT CAROTID SYSTEM: Left CCA tortuous proximally but is widely patent to the bifurcation without stenosis. No significant atheromatous narrowing of the left bifurcation. Left ICA widely patent distally to the circle of Willis without stenosis, dissection or occlusion. VERTEBRAL ARTERIES: Both vertebral arteries arise from the subclavian arteries. No proximal subclavian artery stenosis. Vertebral arteries mildly tortuous proximally but are widely patent within the neck without stenosis, dissection or occlusion. Right vertebral artery slightly dominant. IMPRESSION: MRI HEAD IMPRESSION: 1. No acute intracranial infarct or other abnormality. 2. Moderately advanced age-related cerebral atrophy with chronic small vessel ischemic disease. MRA HEAD IMPRESSION: Negative intracranial MRA. No large vessel occlusion, hemodynamically significant stenosis, or other acute vascular abnormality. No aneurysm. MRA NECK IMPRESSION: 1. Negative MRA of the neck with no hemodynamically significant or critical flow limiting stenosis identified. 2. Tortuosity of the major arterial vasculature of the neck, suggesting chronic underlying hypertension. Electronically Signed   By: Rise MuBenjamin  McClintock M.D.   On: 02/03/2020 23:25   MR ANGIO NECK W WO CONTRAST  Result Date: 02/03/2020 CLINICAL DATA:  Initial evaluation for sudden onset speech difficulty, now resolved. EXAM: MRI HEAD WITHOUT CONTRAST MRA HEAD WITHOUT CONTRAST MRA NECK WITHOUT AND WITH CONTRAST TECHNIQUE: Multiplanar, multiecho pulse sequences of the brain and surrounding structures were obtained without intravenous contrast. Angiographic images of the Circle of Willis were obtained using MRA technique without intravenous contrast. Angiographic images of the neck were obtained using MRA technique without and with intravenous contrast. Carotid stenosis  measurements (when applicable) are obtained utilizing NASCET criteria, using the distal internal carotid diameter as the denominator. CONTRAST:  7mL GADAVIST GADOBUTROL 1 MMOL/ML IV SOLN COMPARISON:  Prior head CT from earlier the same day. FINDINGS: MRI HEAD FINDINGS Brain: Generalized age-related cerebral volume loss. Patchy T2/FLAIR hyperintensity seen within the periventricular and deep white matter both cerebral hemispheres, most consistent with chronic small vessel ischemic disease, moderate in nature. Patchy involvement of the deep gray nuclei and pons noted as well. No abnormal foci of restricted diffusion to suggest acute or subacute ischemia. Gray-white matter differentiation maintained. No encephalomalacia to suggest chronic cortical infarction. No evidence for acute intracranial hemorrhage. Few small punctate chronic micro hemorrhages noted about the right thalamus, likely small vessel related. No mass lesion, midline shift or mass effect. No hydrocephalus or extra-axial fluid collection. Incidental note made of an empty sella. Midline structures intact. Vascular: Major intracranial vascular flow voids well maintained and normal in appearance. Skull and upper cervical spine: Craniocervical junction within normal limits. Bone marrow signal intensity normal. No scalp soft tissue abnormality. Sinuses/Orbits: Globes and orbital soft tissues within normal limits. Paranasal sinuses are largely clear. No significant mastoid effusion. Inner ear structures grossly normal. Other: None. MRA HEAD FINDINGS ANTERIOR CIRCULATION: Examination mildly degraded by motion artifact. Visualized distal cervical segments of the internal carotid arteries are widely patent with symmetric antegrade flow. Petrous, cavernous, supraclinoid ICAs patent without stenosis or other abnormality. A1 segments widely patent. Normal anterior communicating artery complex. Anterior cerebral arteries patent to their distal aspects without  flow-limiting stenosis. Apparent tiny focal outpouching extending from the proximal A2 segments on time-of-flight sequence favored to reflect a small branch vessel (series 9, image 138). No M1 stenosis or occlusion. Normal MCA bifurcations. Distal MCA branches well perfused and symmetric. POSTERIOR CIRCULATION: Vertebral arteries patent  to the vertebrobasilar junction without stenosis. Right vertebral artery slightly dominant. Patent left PICA. Right PICA not well seen. Basilar patent to its distal aspect without stenosis. Superior cerebral arteries patent bilaterally. Right PCA supplied via the basilar. Fetal type origin of the left PCA noted. Both PCAs well perfused to their distal aspects without stenosis. No intracranial aneurysm or other vascular abnormality. MRA NECK FINDINGS AORTIC ARCH: Visualized aortic arch of normal caliber with normal branch pattern. No flow-limiting stenosis seen about the origin of the great vessels. RIGHT CAROTID SYSTEM: Right CCA tortuous proximally but widely patent to the bifurcation without stenosis. No significant atheromatous narrowing about the right bifurcation. Right ICA widely patent distally to the circle-of-Willis without stenosis, dissection or occlusion. LEFT CAROTID SYSTEM: Left CCA tortuous proximally but is widely patent to the bifurcation without stenosis. No significant atheromatous narrowing of the left bifurcation. Left ICA widely patent distally to the circle of Willis without stenosis, dissection or occlusion. VERTEBRAL ARTERIES: Both vertebral arteries arise from the subclavian arteries. No proximal subclavian artery stenosis. Vertebral arteries mildly tortuous proximally but are widely patent within the neck without stenosis, dissection or occlusion. Right vertebral artery slightly dominant. IMPRESSION: MRI HEAD IMPRESSION: 1. No acute intracranial infarct or other abnormality. 2. Moderately advanced age-related cerebral atrophy with chronic small vessel  ischemic disease. MRA HEAD IMPRESSION: Negative intracranial MRA. No large vessel occlusion, hemodynamically significant stenosis, or other acute vascular abnormality. No aneurysm. MRA NECK IMPRESSION: 1. Negative MRA of the neck with no hemodynamically significant or critical flow limiting stenosis identified. 2. Tortuosity of the major arterial vasculature of the neck, suggesting chronic underlying hypertension. Electronically Signed   By: Rise Mu M.D.   On: 02/03/2020 23:25   MR BRAIN WO CONTRAST  Result Date: 02/03/2020 CLINICAL DATA:  Initial evaluation for sudden onset speech difficulty, now resolved. EXAM: MRI HEAD WITHOUT CONTRAST MRA HEAD WITHOUT CONTRAST MRA NECK WITHOUT AND WITH CONTRAST TECHNIQUE: Multiplanar, multiecho pulse sequences of the brain and surrounding structures were obtained without intravenous contrast. Angiographic images of the Circle of Willis were obtained using MRA technique without intravenous contrast. Angiographic images of the neck were obtained using MRA technique without and with intravenous contrast. Carotid stenosis measurements (when applicable) are obtained utilizing NASCET criteria, using the distal internal carotid diameter as the denominator. CONTRAST:  7mL GADAVIST GADOBUTROL 1 MMOL/ML IV SOLN COMPARISON:  Prior head CT from earlier the same day. FINDINGS: MRI HEAD FINDINGS Brain: Generalized age-related cerebral volume loss. Patchy T2/FLAIR hyperintensity seen within the periventricular and deep white matter both cerebral hemispheres, most consistent with chronic small vessel ischemic disease, moderate in nature. Patchy involvement of the deep gray nuclei and pons noted as well. No abnormal foci of restricted diffusion to suggest acute or subacute ischemia. Gray-white matter differentiation maintained. No encephalomalacia to suggest chronic cortical infarction. No evidence for acute intracranial hemorrhage. Few small punctate chronic micro hemorrhages  noted about the right thalamus, likely small vessel related. No mass lesion, midline shift or mass effect. No hydrocephalus or extra-axial fluid collection. Incidental note made of an empty sella. Midline structures intact. Vascular: Major intracranial vascular flow voids well maintained and normal in appearance. Skull and upper cervical spine: Craniocervical junction within normal limits. Bone marrow signal intensity normal. No scalp soft tissue abnormality. Sinuses/Orbits: Globes and orbital soft tissues within normal limits. Paranasal sinuses are largely clear. No significant mastoid effusion. Inner ear structures grossly normal. Other: None. MRA HEAD FINDINGS ANTERIOR CIRCULATION: Examination mildly degraded by motion artifact. Visualized distal  cervical segments of the internal carotid arteries are widely patent with symmetric antegrade flow. Petrous, cavernous, supraclinoid ICAs patent without stenosis or other abnormality. A1 segments widely patent. Normal anterior communicating artery complex. Anterior cerebral arteries patent to their distal aspects without flow-limiting stenosis. Apparent tiny focal outpouching extending from the proximal A2 segments on time-of-flight sequence favored to reflect a small branch vessel (series 9, image 138). No M1 stenosis or occlusion. Normal MCA bifurcations. Distal MCA branches well perfused and symmetric. POSTERIOR CIRCULATION: Vertebral arteries patent to the vertebrobasilar junction without stenosis. Right vertebral artery slightly dominant. Patent left PICA. Right PICA not well seen. Basilar patent to its distal aspect without stenosis. Superior cerebral arteries patent bilaterally. Right PCA supplied via the basilar. Fetal type origin of the left PCA noted. Both PCAs well perfused to their distal aspects without stenosis. No intracranial aneurysm or other vascular abnormality. MRA NECK FINDINGS AORTIC ARCH: Visualized aortic arch of normal caliber with normal branch  pattern. No flow-limiting stenosis seen about the origin of the great vessels. RIGHT CAROTID SYSTEM: Right CCA tortuous proximally but widely patent to the bifurcation without stenosis. No significant atheromatous narrowing about the right bifurcation. Right ICA widely patent distally to the circle-of-Willis without stenosis, dissection or occlusion. LEFT CAROTID SYSTEM: Left CCA tortuous proximally but is widely patent to the bifurcation without stenosis. No significant atheromatous narrowing of the left bifurcation. Left ICA widely patent distally to the circle of Willis without stenosis, dissection or occlusion. VERTEBRAL ARTERIES: Both vertebral arteries arise from the subclavian arteries. No proximal subclavian artery stenosis. Vertebral arteries mildly tortuous proximally but are widely patent within the neck without stenosis, dissection or occlusion. Right vertebral artery slightly dominant. IMPRESSION: MRI HEAD IMPRESSION: 1. No acute intracranial infarct or other abnormality. 2. Moderately advanced age-related cerebral atrophy with chronic small vessel ischemic disease. MRA HEAD IMPRESSION: Negative intracranial MRA. No large vessel occlusion, hemodynamically significant stenosis, or other acute vascular abnormality. No aneurysm. MRA NECK IMPRESSION: 1. Negative MRA of the neck with no hemodynamically significant or critical flow limiting stenosis identified. 2. Tortuosity of the major arterial vasculature of the neck, suggesting chronic underlying hypertension. Electronically Signed   By: Rise Mu M.D.   On: 02/03/2020 23:25   ECHOCARDIOGRAM COMPLETE  Result Date: 02/04/2020    ECHOCARDIOGRAM REPORT   Patient Name:   Kara Pennington Date of Exam: 02/04/2020 Medical Rec #:  [id number]      Height:       61.0 in Accession #:    [id number]     Weight:       174.0 lb Date of Birth:  10-02-1940      BSA:          1.780 m Patient Age:    79 years       BP:           179/86 mmHg Patient Gender: F               HR:           61 bpm. Exam Location:  [facility] Procedure: 2D Echo, Color Doppler and Cardiac Doppler Indications:     R94.31 Abnormal ECG  History:         Patient has no prior history of Echocardiogram examinations.                  Risk Factors:Hypertension.  Sonographer:     Humphrey Rolls RDCS (AE) Referring Phys:  [id number] Gardiner Ramus Mick Tanguma Diagnosing Phys: Smitty Cords  Gwen Pounds MD  Sonographer Comments: Suboptimal subcostal window. IMPRESSIONS  1. Left ventricular ejection fraction, by estimation, is 60 to 65%. The left ventricle has normal function. The left ventricle has no regional wall motion abnormalities. Left ventricular diastolic parameters were normal.  2. Right ventricular systolic function is normal. The right ventricular size is normal.  3. Left atrial size was mildly dilated.  4. The mitral valve is normal in structure. Mild to moderate mitral valve regurgitation.  5. The aortic valve is normal in structure. Aortic valve regurgitation is not visualized. FINDINGS  Left Ventricle: Left ventricular ejection fraction, by estimation, is 60 to 65%. The left ventricle has normal function. The left ventricle has no regional wall motion abnormalities. The left ventricular internal cavity size was normal in size. There is  no left ventricular hypertrophy. Left ventricular diastolic parameters were normal. Right Ventricle: The right ventricular size is normal. No increase in right ventricular wall thickness. Right ventricular systolic function is normal. Left Atrium: Left atrial size was mildly dilated. Right Atrium: Right atrial size was normal in size. Pericardium: There is no evidence of pericardial effusion. Mitral Valve: The mitral valve is normal in structure. Mild to moderate mitral valve regurgitation. MV peak gradient, 5.9 mmHg. The mean mitral valve gradient is 2.0 mmHg. Tricuspid Valve: The tricuspid valve is normal in structure. Tricuspid valve regurgitation is trivial. Aortic Valve: The aortic valve  is normal in structure. Aortic valve regurgitation is not visualized. Aortic valve mean gradient measures 3.0 mmHg. Aortic valve peak gradient measures 5.6 mmHg. Aortic valve area, by VTI measures 3.39 cm. Pulmonic Valve: The pulmonic valve was normal in structure. Pulmonic valve regurgitation is not visualized. Aorta: The aortic root and ascending aorta are structurally normal, with no evidence of dilitation. IAS/Shunts: No atrial level shunt detected by color flow Doppler.  LEFT VENTRICLE PLAX 2D LVIDd:         4.39 cm  Diastology LVIDs:         3.47 cm  LV e' lateral:   5.55 cm/s LV PW:         1.16 cm  LV E/e' lateral: 11.9 LV IVS:        0.84 cm  LV e' medial:    7.07 cm/s LVOT diam:     2.20 cm  LV E/e' medial:  9.3 LV SV:         97 LV SV Index:   54 LVOT Area:     3.80 cm  RIGHT VENTRICLE RV Basal diam:  2.91 cm LEFT ATRIUM             Index       RIGHT ATRIUM           Index LA diam:        3.40 cm 1.91 cm/m  RA Area:     12.80 cm LA Vol (A2C):   25.1 ml 14.10 ml/m RA Volume:   29.90 ml  16.80 ml/m LA Vol (A4C):   36.0 ml 20.22 ml/m LA Biplane Vol: 31.6 ml 17.75 ml/m  AORTIC VALVE                   PULMONIC VALVE AV Area (Vmax):    3.32 cm    PV Vmax:       0.79 m/s AV Area (Vmean):   3.31 cm    PV Vmean:      58.600 cm/s AV Area (VTI):     3.39 cm    PV  VTI:        0.233 m AV Vmax:           118.00 cm/s PV Peak grad:  2.5 mmHg AV Vmean:          80.500 cm/s PV Mean grad:  2.0 mmHg AV VTI:            0.286 m AV Peak Grad:      5.6 mmHg AV Mean Grad:      3.0 mmHg LVOT Vmax:         103.00 cm/s LVOT Vmean:        70.200 cm/s LVOT VTI:          0.255 m LVOT/AV VTI ratio: 0.89  AORTA Ao Root diam: 3.00 cm MITRAL VALVE MV Area (PHT): 3.06 cm    SHUNTS MV Peak grad:  5.9 mmHg    Systemic VTI:  0.26 m MV Mean grad:  2.0 mmHg    Systemic Diam: 2.20 cm MV Vmax:       1.21 m/s MV Vmean:      72.8 cm/s MV Decel Time: 248 msec MV E velocity: 66.00 cm/s MV A velocity: 85.30 cm/s MV E/A ratio:  0.77 Arnoldo Hooker MD Electronically signed by Arnoldo Hooker MD Signature Date/Time: 02/04/2020/1:43:03 PM    Final    CT HEAD CODE STROKE WO CONTRAST  Result Date: 02/03/2020 CLINICAL DATA:  Code stroke. Sudden onset of speech disturbance 1330 hours. EXAM: CT HEAD WITHOUT CONTRAST TECHNIQUE: Contiguous axial images were obtained from the base of the skull through the vertex without intravenous contrast. COMPARISON:  01/24/2010 FINDINGS: Brain: Age related volume loss. No focal abnormality is seen affecting the brainstem or cerebellum. Cerebral hemispheres show chronic appearing small vessel ischemic changes of the thalami left more than right and of the cerebral hemispheric white matter. No sign of acute infarction, mass lesion, hemorrhage, hydrocephalus or extra-axial collection. Vascular: There is atherosclerotic calcification of the major vessels at the base of the brain. Skull: Negative Sinuses/Orbits: Clear/normal Other: None ASPECTS (Alberta Stroke Program Early CT Score) - Ganglionic level infarction (caudate, lentiform nuclei, internal capsule, insula, M1-M3 cortex): 7 - Supraganglionic infarction (M4-M6 cortex): 3 Total score (0-10 with 10 being normal): 10 IMPRESSION: 1. No acute CT finding. Atrophy and chronic small-vessel ischemic changes as above. 2. ASPECTS is 10 3. These results were called by telephone at the time of interpretation on 02/03/2020 at 3:18 pm to provider Dr. Larinda Buttery, who verbally acknowledged these results. Electronically Signed   By: Paulina Fusi M.D.   On: 02/03/2020 15:20    (Echo, Carotid, EGD, Colonoscopy, ERCP)    Subjective:  Patient resting in bed awake alert anxious to go home Discharge Exam: Vitals:   02/04/20 0829 02/04/20 1134  BP: (!) 179/86 (!) 188/66  Pulse: 54 58  Resp: 20 18  Temp: 97.7 F (36.5 C)   SpO2: 98%    Vitals:   02/03/20 2023 02/04/20 0811 02/04/20 0829 02/04/20 1134  BP: (!) 188/98  (!) 179/86 (!) 188/66  Pulse: 70  54 58  Resp:   20 18   Temp:  97.7 F (36.5 C) 97.7 F (36.5 C)   TempSrc:  Oral Oral   SpO2:   98%   Weight:      Height:        General: Pt is alert, awake, not in acute distress Cardiovascular: RRR, S1/S2 +, no rubs, no gallops Respiratory: CTA bilaterally, no wheezing, no rhonchi Abdominal: Soft, NT, ND,  bowel sounds + Extremities: no edema, no cyanosis    The results of significant diagnostics from this hospitalization (including imaging, microbiology, ancillary and laboratory) are listed below for reference.     Microbiology: Recent Results (from the past 240 hour(s))  SARS Coronavirus 2 by RT PCR (hospital order, performed in [facility] hospital lab) Nasopharyngeal Nasopharyngeal Swab     Status: None   Collection Time: 02/03/20  5:38 PM   Specimen: Nasopharyngeal Swab  Result Value Ref Range Status   SARS Coronavirus 2 NEGATIVE NEGATIVE Final    Comment: (NOTE) SARS-CoV-2 target nucleic acids are NOT DETECTED.  The SARS-CoV-2 RNA is generally detectable in upper and lower respiratory specimens during the acute phase of infection. The lowest concentration of SARS-CoV-2 viral copies this assay can detect is 250 copies / mL. A negative result does not preclude SARS-CoV-2 infection and should not be used as the sole basis for treatment or other patient management decisions.  A negative result may occur with improper specimen collection / handling, submission of specimen other than nasopharyngeal swab, presence of viral mutation(s) within the areas targeted by this assay, and inadequate number of viral copies (<250 copies / mL). A negative result must be combined with clinical observations, patient history, and epidemiological information.  Fact Sheet for Patients:   BoilerBrush.com.cy  Fact Sheet for Healthcare Providers: https://pope.com/  This test is not yet approved or  cleared by the [place] FDA and has been authorized for  detection and/or diagnosis of SARS-CoV-2 by FDA under an Emergency Use Authorization (EUA).  This EUA will remain in effect (meaning this test can be used) for the duration of the COVID-19 declaration under Section 564(b)(1) of the Act, 21 U.S.C. section 360bbb-3(b)(1), unless the authorization is terminated or revoked sooner.  Performed at [facility], [address] [place], [place] [address]      Labs: BNP (last 3 results) No results for input(s): BNP in the last 8760 hours. Basic Metabolic Panel: Recent Labs  Lab 02/03/20 1506 02/04/20 0523  NA 137 140  K 4.1 3.9  CL 103 107  CO2 26 27  GLUCOSE 118* 87  BUN 24* 23  CREATININE 1.31* 1.01*  CALCIUM 9.4 8.2*   Liver Function Tests: Recent Labs  Lab 02/03/20 1506 02/04/20 0523  AST 34 31  ALT 22 19  ALKPHOS 60 66  BILITOT 1.0 0.8  PROT 6.6 5.2*  ALBUMIN 3.7 3.2*   No results for input(s): LIPASE, AMYLASE in the last 168 hours. No results for input(s): AMMONIA in the last 168 hours. CBC: Recent Labs  Lab 02/03/20 1506 02/04/20 0523  WBC 6.5 5.7  NEUTROABS 4.1  --   HGB 13.4 11.9*  HCT 40.1 34.0*  MCV 96.4 94.2  PLT 177 136*   Cardiac Enzymes: Recent Labs  Lab 02/03/20 1506  CKTOTAL 57   BNP: Invalid input(s): POCBNP CBG: No results for input(s): GLUCAP in the last 168 hours. D-Dimer No results for input(s): DDIMER in the last 72 hours. Hgb A1c Recent Labs    02/04/20 0523  HGBA1C 5.2   Lipid Profile Recent Labs    02/04/20 0523  CHOL 143  HDL 48  LDLCALC 82  TRIG 67  CHOLHDL 3.0   Thyroid function studies Recent Labs    02/03/20 1600  TSH 5.578*   Anemia work up No results for input(s): VITAMINB12, FOLATE, FERRITIN, TIBC, IRON, RETICCTPCT in the last 72 hours. Urinalysis    Component Value Date/Time   COLORURINE  YELLOW (A) 10/06/2016 0759   APPEARANCEUR CLEAR (A) 10/06/2016 0759   APPEARANCEUR Hazy 11/24/2012 2239   LABSPEC 1.024 10/06/2016 0759   LABSPEC  1.018 11/24/2012 2239   PHURINE 5.0 10/06/2016 0759   GLUCOSEU NEGATIVE 10/06/2016 0759   GLUCOSEU Negative 11/24/2012 2239   HGBUR SMALL (A) 10/06/2016 0759   BILIRUBINUR NEGATIVE 10/06/2016 0759   BILIRUBINUR Negative 11/24/2012 2239   KETONESUR NEGATIVE 10/06/2016 0759   PROTEINUR NEGATIVE 10/06/2016 0759   NITRITE NEGATIVE 10/06/2016 0759   LEUKOCYTESUR NEGATIVE 10/06/2016 0759   LEUKOCYTESUR 1+ 11/24/2012 2239   Sepsis Labs Invalid input(s): PROCALCITONIN,  WBC,  LACTICIDVEN Microbiology Recent Results (from the past 240 hour(s))  SARS Coronavirus 2 by RT PCR (hospital order, performed in [facility] hospital lab) Nasopharyngeal Nasopharyngeal Swab     Status: None   Collection Time: 02/03/20  5:38 PM   Specimen: Nasopharyngeal Swab  Result Value Ref Range Status   SARS Coronavirus 2 NEGATIVE NEGATIVE Final    Comment: (NOTE) SARS-CoV-2 target nucleic acids are NOT DETECTED.  The SARS-CoV-2 RNA is generally detectable in upper and lower respiratory specimens during the acute phase of infection. The lowest concentration of SARS-CoV-2 viral copies this assay can detect is 250 copies / mL. A negative result does not preclude SARS-CoV-2 infection and should not be used as the sole basis for treatment or other patient management decisions.  A negative result may occur with improper specimen collection / handling, submission of specimen other than nasopharyngeal swab, presence of viral mutation(s) within the areas targeted by this assay, and inadequate number of viral copies (<250 copies / mL). A negative result must be combined with clinical observations, patient history, and epidemiological information.  Fact Sheet for Patients:   BoilerBrush.com.cy  Fact Sheet for Healthcare Providers: https://pope.com/  This test is not yet approved or  cleared by the [place] FDA and has been authorized for detection and/or diagnosis  of SARS-CoV-2 by FDA under an Emergency Use Authorization (EUA).  This EUA will remain in effect (meaning this test can be used) for the duration of the COVID-19 declaration under Section 564(b)(1) of the Act, 21 U.S.C. section 360bbb-3(b)(1), unless the authorization is terminated or revoked sooner.  Performed at [facility], [address], [place], [place] [address]      Time coordinating discharge: Over 30 minutes  SIGNED:   Alwyn Ren, MD  [facility] 02/04/2020, 1:55 PM    If 7PM-7AM, please contact night-coverage [email or web address] Password [email or web address]

## 2020-02-04 NOTE — Progress Notes (Signed)
OT Cancellation Note  Patient Details Name: Kara Pennington MRN: [id number] DOB: 17-Mar-1941   Cancelled Treatment:    Reason Eval/Treat Not Completed: OT screened, no needs identified, will sign off   Thank you for OT consult.  Chart reviewed, and spoke to physical therapy and nursing.  Per PT and RN report, pt is back to baseline level of functioning and has no OT-related needs at this time.  Will screen pt out of OT caseload, please re-consult if pt has change in functional status or additional OT-related needs arise.  Kathyrn Drown Justn Quale, OTR/L 02/04/20, 1:04 PM

## 2020-02-04 NOTE — Progress Notes (Signed)
SLP Cancellation Note  Patient Details Name: Kara Pennington MRN: [id number] DOB: 07/05/1941   Cancelled treatment:       Reason Eval/Treat Not Completed: SLP screened, no needs identified, will sign off   Pt is free of overt s/s of aspiration at bedside. Additionally, pt passed [employer] Screen.   Aviyon Hocevar B. Dreama Saa M.S., CCC-SLP, [facility] Speech-Language Pathologist Rehabilitation Services Office [phone]   Reuel Derby 02/04/2020, 11:39 AM

## 2020-02-04 NOTE — Progress Notes (Signed)
[facility]  Physical Therapy Certification  Patient Details  Name: Kara Pennington MRN: [id number] Date of Birth: [birth year] Medical Diagnosis: Active Problems:   TIA (transient ischemic attack)  Visit Diagnosis: PT Visit Diagnosis: Difficulty in walking, not elsewhere classified (R26.2) PT Problem: Decreased strength, Decreased range of motion, Decreased activity tolerance, Decreased balance, Decreased mobility, Decreased coordination, Decreased knowledge of use of DME, Decreased cognition, Decreased safety awareness, Decreased knowledge of precautions  Goals: Pt will go Supine/Side to Sit: with modified independence (for pulmonary hygiene and self-repositioning) Pt will Transfer Bed to Chair/Chair to Bed: with modified independence (LRAD, for access to seating surfaces in home environment) Pt will Ambulate: > 125 feet, with modified independence, with least restrictive assistive device (for household mobilization upon discharge)  Duration: Services will be provided through the following date: 02/18/20  Frequency: Min 2X/week  Amount: one treatment session per day unless otherwise indicated.    Certification Start Date: 02/04/2020 Certification End Date: 02/18/20   PT Treatments/Interventions: DME instruction, Gait training, Functional mobility training, Therapeutic activities, Therapeutic exercise, Balance training, Patient/family education  Maddux Vanscyoc H. Manson Passey, PT, DPT, NCS 02/04/20, 5:38 PM [phone]

## 2020-02-04 NOTE — Progress Notes (Signed)
Subjective: Pt is back to baseline. No focality on examination   Past Medical History:  Diagnosis Date  . Anxiety   . Depression   . GERD (gastroesophageal reflux disease)   . Hypertension     Past Surgical History:  Procedure Laterality Date  . APPENDECTOMY    . PARATHYROIDECTOMY      No family history on file. Social History:  reports that she has never smoked. She has never used smokeless tobacco. She reports current alcohol use. She reports that she does not use drugs.  Allergies:  Allergies  Allergen Reactions  . Amoxicillin-Pot Clavulanate Hives and Rash  . Celecoxib Other (See Comments) and Rash    Other Reaction: Not Assessed Other Reaction: Not Assessed     Medications: I have reviewed the patient's current medications.   Physical Examination: Blood pressure (!) 179/86, pulse 54, temperature 97.7 F (36.5 C), temperature source Oral, resp. rate 20, height 5\' 1"  (1.549 m), weight 78.9 kg, SpO2 98 %.   Neurological Examination   Mental Status: Alert, oriented, thought content appropriate.  Speech fluent without evidence of aphasia.  Able to follow 3 step commands without difficulty. Cranial Nerves: II: Discs flat bilaterally; Visual fields grossly normal, pupils equal, round, reactive to light and accommodation III,IV, VI: ptosis not present, extra-ocular motions intact bilaterally V,VII: smile symmetric, facial light touch sensation normal bilaterally VIII: hearing normal bilaterally IX,X: gag reflex present XI: bilateral shoulder shrug XII: midline tongue extension Motor: Right : Upper extremity   5/5    Left:     Upper extremity   5/5  Lower extremity   5/5     Lower extremity   5/5 Tone and bulk:normal tone throughout; no atrophy noted Sensory: Pinprick and light touch intact throughout, bilaterally Deep Tendon Reflexes: 2+ and symmetric throughout Plantars: Right: downgoing   Left: downgoing Cerebellar: normal finger-to-nose, normal rapid  alternating movements and normal heel-to-shin test Gait: not tested      Laboratory Studies:  Basic Metabolic Panel: Recent Labs  Lab 02/03/20 1506 02/04/20 0523  NA 137 140  K 4.1 3.9  CL 103 107  CO2 26 27  GLUCOSE 118* 87  BUN 24* 23  CREATININE 1.31* 1.01*  CALCIUM 9.4 8.2*    Liver Function Tests: Recent Labs  Lab 02/03/20 1506 02/04/20 0523  AST 34 31  ALT 22 19  ALKPHOS 60 66  BILITOT 1.0 0.8  PROT 6.6 5.2*  ALBUMIN 3.7 3.2*   No results for input(s): LIPASE, AMYLASE in the last 168 hours. No results for input(s): AMMONIA in the last 168 hours.  CBC: Recent Labs  Lab 02/03/20 1506 02/04/20 0523  WBC 6.5 5.7  NEUTROABS 4.1  --   HGB 13.4 11.9*  HCT 40.1 34.0*  MCV 96.4 94.2  PLT 177 136*    Cardiac Enzymes: Recent Labs  Lab 02/03/20 1506  CKTOTAL 57    BNP: Invalid input(s): POCBNP  CBG: No results for input(s): GLUCAP in the last 168 hours.  Microbiology: Results for orders placed or performed during the hospital encounter of 02/03/20  SARS Coronavirus 2 by RT PCR (hospital order, [facility]) Nasopharyngeal Nasopharyngeal Swab     Status: None   Collection Time: 02/03/20  5:38 PM   Specimen: Nasopharyngeal Swab  Result Value Ref Range Status   SARS Coronavirus 2 NEGATIVE NEGATIVE Final    Comment: (NOTE) SARS-CoV-2 target nucleic acids are NOT DETECTED.  The SARS-CoV-2 RNA is generally detectable in upper and  lower respiratory specimens during the acute phase of infection. The lowest concentration of SARS-CoV-2 viral copies this assay can detect is 250 copies / mL. A negative result does not preclude SARS-CoV-2 infection and should not be used as the sole basis for treatment or other patient management decisions.  A negative result may occur with improper specimen collection / handling, submission of specimen other than nasopharyngeal swab, presence of viral mutation(s) within the areas targeted by this  assay, and inadequate number of viral copies (<250 copies / mL). A negative result must be combined with clinical observations, patient history, and epidemiological information.  Fact Sheet for Patients:   BoilerBrush.com.cy  Fact Sheet for Healthcare Providers: https://pope.com/  This test is not yet approved or  cleared by the [place] FDA and has been authorized for detection and/or diagnosis of SARS-CoV-2 by FDA under an Emergency Use Authorization (EUA).  This EUA will remain in effect (meaning this test can be used) for the duration of the COVID-19 declaration under Section 564(b)(1) of the Act, 21 U.S.C. section 360bbb-3(b)(1), unless the authorization is terminated or revoked sooner.  Performed at [facility], [address] [place], [place] [address]     Coagulation Studies: Recent Labs    02/03/20 1506  LABPROT 13.3  INR 1.1    Urinalysis: No results for input(s): COLORURINE, LABSPEC, PHURINE, GLUCOSEU, HGBUR, BILIRUBINUR, KETONESUR, PROTEINUR, UROBILINOGEN, NITRITE, LEUKOCYTESUR in the last 168 hours.  Invalid input(s): APPERANCEUR  Lipid Panel:    Component Value Date/Time   CHOL 143 02/04/2020 0523   TRIG 67 02/04/2020 0523   HDL 48 02/04/2020 0523   CHOLHDL 3.0 02/04/2020 0523   VLDL 13 02/04/2020 0523   LDLCALC 82 02/04/2020 0523    HgbA1C:  Lab Results  Component Value Date   HGBA1C 5.2 02/04/2020    Urine Drug Screen:      Component Value Date/Time   LABOPIA NEGATIVE 11/24/2012 2239   COCAINSCRNUR NEGATIVE 11/24/2012 2239   LABBENZ NEGATIVE 11/24/2012 2239   AMPHETMU NEGATIVE 11/24/2012 2239   THCU NEGATIVE 11/24/2012 2239   LABBARB NEGATIVE 11/24/2012 2239    Alcohol Level: No results for input(s): ETH in the last 168 hours.  Other results: EKG: normal EKG, normal sinus rhythm, unchanged from previous tracings.  Imaging: MR ANGIO HEAD WO CONTRAST  Result Date:  02/03/2020 CLINICAL DATA:  Initial evaluation for sudden onset speech difficulty, now resolved. EXAM: MRI HEAD WITHOUT CONTRAST MRA HEAD WITHOUT CONTRAST MRA NECK WITHOUT AND WITH CONTRAST TECHNIQUE: Multiplanar, multiecho pulse sequences of the brain and surrounding structures were obtained without intravenous contrast. Angiographic images of the Circle of Willis were obtained using MRA technique without intravenous contrast. Angiographic images of the neck were obtained using MRA technique without and with intravenous contrast. Carotid stenosis measurements (when applicable) are obtained utilizing NASCET criteria, using the distal internal carotid diameter as the denominator. CONTRAST:  55mL GADAVIST GADOBUTROL 1 MMOL/ML IV SOLN COMPARISON:  Prior head CT from earlier the same day. FINDINGS: MRI HEAD FINDINGS Brain: Generalized age-related cerebral volume loss. Patchy T2/FLAIR hyperintensity seen within the periventricular and deep white matter both cerebral hemispheres, most consistent with chronic small vessel ischemic disease, moderate in nature. Patchy involvement of the deep gray nuclei and pons noted as well. No abnormal foci of restricted diffusion to suggest acute or subacute ischemia. Gray-white matter differentiation maintained. No encephalomalacia to suggest chronic cortical infarction. No evidence for acute intracranial hemorrhage. Few small punctate chronic micro hemorrhages noted about the right thalamus, likely  small vessel related. No mass lesion, midline shift or mass effect. No hydrocephalus or extra-axial fluid collection. Incidental note made of an empty sella. Midline structures intact. Vascular: Major intracranial vascular flow voids well maintained and normal in appearance. Skull and upper cervical spine: Craniocervical junction within normal limits. Bone marrow signal intensity normal. No scalp soft tissue abnormality. Sinuses/Orbits: Globes and orbital soft tissues within normal limits.  Paranasal sinuses are largely clear. No significant mastoid effusion. Inner ear structures grossly normal. Other: None. MRA HEAD FINDINGS ANTERIOR CIRCULATION: Examination mildly degraded by motion artifact. Visualized distal cervical segments of the internal carotid arteries are widely patent with symmetric antegrade flow. Petrous, cavernous, supraclinoid ICAs patent without stenosis or other abnormality. A1 segments widely patent. Normal anterior communicating artery complex. Anterior cerebral arteries patent to their distal aspects without flow-limiting stenosis. Apparent tiny focal outpouching extending from the proximal A2 segments on time-of-flight sequence favored to reflect a small branch vessel (series 9, image 138). No M1 stenosis or occlusion. Normal MCA bifurcations. Distal MCA branches well perfused and symmetric. POSTERIOR CIRCULATION: Vertebral arteries patent to the vertebrobasilar junction without stenosis. Right vertebral artery slightly dominant. Patent left PICA. Right PICA not well seen. Basilar patent to its distal aspect without stenosis. Superior cerebral arteries patent bilaterally. Right PCA supplied via the basilar. Fetal type origin of the left PCA noted. Both PCAs well perfused to their distal aspects without stenosis. No intracranial aneurysm or other vascular abnormality. MRA NECK FINDINGS AORTIC ARCH: Visualized aortic arch of normal caliber with normal branch pattern. No flow-limiting stenosis seen about the origin of the great vessels. RIGHT CAROTID SYSTEM: Right CCA tortuous proximally but widely patent to the bifurcation without stenosis. No significant atheromatous narrowing about the right bifurcation. Right ICA widely patent distally to the circle-of-Willis without stenosis, dissection or occlusion. LEFT CAROTID SYSTEM: Left CCA tortuous proximally but is widely patent to the bifurcation without stenosis. No significant atheromatous narrowing of the left bifurcation. Left ICA  widely patent distally to the circle of Willis without stenosis, dissection or occlusion. VERTEBRAL ARTERIES: Both vertebral arteries arise from the subclavian arteries. No proximal subclavian artery stenosis. Vertebral arteries mildly tortuous proximally but are widely patent within the neck without stenosis, dissection or occlusion. Right vertebral artery slightly dominant. IMPRESSION: MRI HEAD IMPRESSION: 1. No acute intracranial infarct or other abnormality. 2. Moderately advanced age-related cerebral atrophy with chronic small vessel ischemic disease. MRA HEAD IMPRESSION: Negative intracranial MRA. No large vessel occlusion, hemodynamically significant stenosis, or other acute vascular abnormality. No aneurysm. MRA NECK IMPRESSION: 1. Negative MRA of the neck with no hemodynamically significant or critical flow limiting stenosis identified. 2. Tortuosity of the major arterial vasculature of the neck, suggesting chronic underlying hypertension. Electronically Signed   By: Rise Mu M.D.   On: 02/03/2020 23:25   MR ANGIO NECK W WO CONTRAST  Result Date: 02/03/2020 CLINICAL DATA:  Initial evaluation for sudden onset speech difficulty, now resolved. EXAM: MRI HEAD WITHOUT CONTRAST MRA HEAD WITHOUT CONTRAST MRA NECK WITHOUT AND WITH CONTRAST TECHNIQUE: Multiplanar, multiecho pulse sequences of the brain and surrounding structures were obtained without intravenous contrast. Angiographic images of the Circle of Willis were obtained using MRA technique without intravenous contrast. Angiographic images of the neck were obtained using MRA technique without and with intravenous contrast. Carotid stenosis measurements (when applicable) are obtained utilizing NASCET criteria, using the distal internal carotid diameter as the denominator. CONTRAST:  7mL GADAVIST GADOBUTROL 1 MMOL/ML IV SOLN COMPARISON:  Prior head CT from earlier the  same day. FINDINGS: MRI HEAD FINDINGS Brain: Generalized age-related cerebral  volume loss. Patchy T2/FLAIR hyperintensity seen within the periventricular and deep white matter both cerebral hemispheres, most consistent with chronic small vessel ischemic disease, moderate in nature. Patchy involvement of the deep gray nuclei and pons noted as well. No abnormal foci of restricted diffusion to suggest acute or subacute ischemia. Gray-white matter differentiation maintained. No encephalomalacia to suggest chronic cortical infarction. No evidence for acute intracranial hemorrhage. Few small punctate chronic micro hemorrhages noted about the right thalamus, likely small vessel related. No mass lesion, midline shift or mass effect. No hydrocephalus or extra-axial fluid collection. Incidental note made of an empty sella. Midline structures intact. Vascular: Major intracranial vascular flow voids well maintained and normal in appearance. Skull and upper cervical spine: Craniocervical junction within normal limits. Bone marrow signal intensity normal. No scalp soft tissue abnormality. Sinuses/Orbits: Globes and orbital soft tissues within normal limits. Paranasal sinuses are largely clear. No significant mastoid effusion. Inner ear structures grossly normal. Other: None. MRA HEAD FINDINGS ANTERIOR CIRCULATION: Examination mildly degraded by motion artifact. Visualized distal cervical segments of the internal carotid arteries are widely patent with symmetric antegrade flow. Petrous, cavernous, supraclinoid ICAs patent without stenosis or other abnormality. A1 segments widely patent. Normal anterior communicating artery complex. Anterior cerebral arteries patent to their distal aspects without flow-limiting stenosis. Apparent tiny focal outpouching extending from the proximal A2 segments on time-of-flight sequence favored to reflect a small branch vessel (series 9, image 138). No M1 stenosis or occlusion. Normal MCA bifurcations. Distal MCA branches well perfused and symmetric. POSTERIOR CIRCULATION:  Vertebral arteries patent to the vertebrobasilar junction without stenosis. Right vertebral artery slightly dominant. Patent left PICA. Right PICA not well seen. Basilar patent to its distal aspect without stenosis. Superior cerebral arteries patent bilaterally. Right PCA supplied via the basilar. Fetal type origin of the left PCA noted. Both PCAs well perfused to their distal aspects without stenosis. No intracranial aneurysm or other vascular abnormality. MRA NECK FINDINGS AORTIC ARCH: Visualized aortic arch of normal caliber with normal branch pattern. No flow-limiting stenosis seen about the origin of the great vessels. RIGHT CAROTID SYSTEM: Right CCA tortuous proximally but widely patent to the bifurcation without stenosis. No significant atheromatous narrowing about the right bifurcation. Right ICA widely patent distally to the circle-of-Willis without stenosis, dissection or occlusion. LEFT CAROTID SYSTEM: Left CCA tortuous proximally but is widely patent to the bifurcation without stenosis. No significant atheromatous narrowing of the left bifurcation. Left ICA widely patent distally to the circle of Willis without stenosis, dissection or occlusion. VERTEBRAL ARTERIES: Both vertebral arteries arise from the subclavian arteries. No proximal subclavian artery stenosis. Vertebral arteries mildly tortuous proximally but are widely patent within the neck without stenosis, dissection or occlusion. Right vertebral artery slightly dominant. IMPRESSION: MRI HEAD IMPRESSION: 1. No acute intracranial infarct or other abnormality. 2. Moderately advanced age-related cerebral atrophy with chronic small vessel ischemic disease. MRA HEAD IMPRESSION: Negative intracranial MRA. No large vessel occlusion, hemodynamically significant stenosis, or other acute vascular abnormality. No aneurysm. MRA NECK IMPRESSION: 1. Negative MRA of the neck with no hemodynamically significant or critical flow limiting stenosis identified. 2.  Tortuosity of the major arterial vasculature of the neck, suggesting chronic underlying hypertension. Electronically Signed   By: Rise Mu M.D.   On: 02/03/2020 23:25   MR BRAIN WO CONTRAST  Result Date: 02/03/2020 CLINICAL DATA:  Initial evaluation for sudden onset speech difficulty, now resolved. EXAM: MRI HEAD WITHOUT CONTRAST MRA HEAD WITHOUT  CONTRAST MRA NECK WITHOUT AND WITH CONTRAST TECHNIQUE: Multiplanar, multiecho pulse sequences of the brain and surrounding structures were obtained without intravenous contrast. Angiographic images of the Circle of Willis were obtained using MRA technique without intravenous contrast. Angiographic images of the neck were obtained using MRA technique without and with intravenous contrast. Carotid stenosis measurements (when applicable) are obtained utilizing NASCET criteria, using the distal internal carotid diameter as the denominator. CONTRAST:  84mL GADAVIST GADOBUTROL 1 MMOL/ML IV SOLN COMPARISON:  Prior head CT from earlier the same day. FINDINGS: MRI HEAD FINDINGS Brain: Generalized age-related cerebral volume loss. Patchy T2/FLAIR hyperintensity seen within the periventricular and deep white matter both cerebral hemispheres, most consistent with chronic small vessel ischemic disease, moderate in nature. Patchy involvement of the deep gray nuclei and pons noted as well. No abnormal foci of restricted diffusion to suggest acute or subacute ischemia. Gray-white matter differentiation maintained. No encephalomalacia to suggest chronic cortical infarction. No evidence for acute intracranial hemorrhage. Few small punctate chronic micro hemorrhages noted about the right thalamus, likely small vessel related. No mass lesion, midline shift or mass effect. No hydrocephalus or extra-axial fluid collection. Incidental note made of an empty sella. Midline structures intact. Vascular: Major intracranial vascular flow voids well maintained and normal in appearance.  Skull and upper cervical spine: Craniocervical junction within normal limits. Bone marrow signal intensity normal. No scalp soft tissue abnormality. Sinuses/Orbits: Globes and orbital soft tissues within normal limits. Paranasal sinuses are largely clear. No significant mastoid effusion. Inner ear structures grossly normal. Other: None. MRA HEAD FINDINGS ANTERIOR CIRCULATION: Examination mildly degraded by motion artifact. Visualized distal cervical segments of the internal carotid arteries are widely patent with symmetric antegrade flow. Petrous, cavernous, supraclinoid ICAs patent without stenosis or other abnormality. A1 segments widely patent. Normal anterior communicating artery complex. Anterior cerebral arteries patent to their distal aspects without flow-limiting stenosis. Apparent tiny focal outpouching extending from the proximal A2 segments on time-of-flight sequence favored to reflect a small branch vessel (series 9, image 138). No M1 stenosis or occlusion. Normal MCA bifurcations. Distal MCA branches well perfused and symmetric. POSTERIOR CIRCULATION: Vertebral arteries patent to the vertebrobasilar junction without stenosis. Right vertebral artery slightly dominant. Patent left PICA. Right PICA not well seen. Basilar patent to its distal aspect without stenosis. Superior cerebral arteries patent bilaterally. Right PCA supplied via the basilar. Fetal type origin of the left PCA noted. Both PCAs well perfused to their distal aspects without stenosis. No intracranial aneurysm or other vascular abnormality. MRA NECK FINDINGS AORTIC ARCH: Visualized aortic arch of normal caliber with normal branch pattern. No flow-limiting stenosis seen about the origin of the great vessels. RIGHT CAROTID SYSTEM: Right CCA tortuous proximally but widely patent to the bifurcation without stenosis. No significant atheromatous narrowing about the right bifurcation. Right ICA widely patent distally to the circle-of-Willis  without stenosis, dissection or occlusion. LEFT CAROTID SYSTEM: Left CCA tortuous proximally but is widely patent to the bifurcation without stenosis. No significant atheromatous narrowing of the left bifurcation. Left ICA widely patent distally to the circle of Willis without stenosis, dissection or occlusion. VERTEBRAL ARTERIES: Both vertebral arteries arise from the subclavian arteries. No proximal subclavian artery stenosis. Vertebral arteries mildly tortuous proximally but are widely patent within the neck without stenosis, dissection or occlusion. Right vertebral artery slightly dominant. IMPRESSION: MRI HEAD IMPRESSION: 1. No acute intracranial infarct or other abnormality. 2. Moderately advanced age-related cerebral atrophy with chronic small vessel ischemic disease. MRA HEAD IMPRESSION: Negative intracranial MRA. No large vessel  occlusion, hemodynamically significant stenosis, or other acute vascular abnormality. No aneurysm. MRA NECK IMPRESSION: 1. Negative MRA of the neck with no hemodynamically significant or critical flow limiting stenosis identified. 2. Tortuosity of the major arterial vasculature of the neck, suggesting chronic underlying hypertension. Electronically Signed   By: Rise Mu M.D.   On: 02/03/2020 23:25   CT HEAD CODE STROKE WO CONTRAST  Result Date: 02/03/2020 CLINICAL DATA:  Code stroke. Sudden onset of speech disturbance 1330 hours. EXAM: CT HEAD WITHOUT CONTRAST TECHNIQUE: Contiguous axial images were obtained from the base of the skull through the vertex without intravenous contrast. COMPARISON:  01/24/2010 FINDINGS: Brain: Age related volume loss. No focal abnormality is seen affecting the brainstem or cerebellum. Cerebral hemispheres show chronic appearing small vessel ischemic changes of the thalami left more than right and of the cerebral hemispheric white matter. No sign of acute infarction, mass lesion, hemorrhage, hydrocephalus or extra-axial collection.  Vascular: There is atherosclerotic calcification of the major vessels at the base of the brain. Skull: Negative Sinuses/Orbits: Clear/normal Other: None ASPECTS ([place] Stroke Program Early CT Score) - Ganglionic level infarction (caudate, lentiform nuclei, internal capsule, insula, M1-M3 cortex): 7 - Supraganglionic infarction (M4-M6 cortex): 3 Total score (0-10 with 10 being normal): 10 IMPRESSION: 1. No acute CT finding. Atrophy and chronic small-vessel ischemic changes as above. 2. ASPECTS is 10 3. These results were called by telephone at the time of interpretation on 02/03/2020 at 3:18 pm to provider Dr. Larinda Buttery, who verbally acknowledged these results. Electronically Signed   By: Paulina Fusi M.D.   On: 02/03/2020 15:20    Assessment: 79 y.o. female with hx of recent covid infection sent from [facility] [place] for transient aphasia. Pt states she was outside and sweaty at which time she had difficulty expressing herself and trouble getting words out. No other clear focality on examination. Once pt arrived she has improved and currently seems back to baseline.     - Pt is back to her baseline - MRI and MRA no acute abnormalities - awaiting pt/ot/speech - please start her on PO diet if passes bedside swallow eval which she should - Echo pending - on IVFat 100cc/hr - creat much improved this AM - likely head related - discharge from Neurological stand point today - ASA 81mg  daily on d/c

## 2020-02-04 NOTE — Progress Notes (Signed)
*  PRELIMINARY RESULTS* Echocardiogram 2D Echocardiogram has been performed.  Kara Pennington 02/04/2020, 10:24 AM

## 2020-02-04 NOTE — Evaluation (Signed)
Physical Therapy Evaluation Patient Details Name: [name] MRN: [id number] DOB: 1940/08/09 Today's Date: 02/04/2020   History of Present Illness  presented to ER secondary to acute onset of aphasia; admitted for TIA/CVA work up, dehydration with AKI.  MRI negative for acute infarct, large vessel occlusion.  Symptoms fully resolved per patient/daughter report.  Clinical Impression  Upon evaluation, patient alert and oriented; follows commands and demonstrates good effort with mobility tasks.  Bilat UE/LE strength and ROM grossly symmetrical and WFL; no focal weakness, sensory deficit appreciated.  Able to complete bed mobility with cga; sit/stand, basic transfers and gait (200') with RW, cga/close sup.  Decreased step height/length, decreased cadence and mild forward trunk flexion; no overt buckling or LOB.  Higher level balance/endurance deficits appreciated; do recommend continued use of RW for optimal safety. Would benefit from skilled PT to address above deficits and promote optimal return to PLOF.;d Recommend transition to HHPT upon discharge from acute hospitalization.     Follow Up Recommendations Home health PT    Equipment Recommendations       Recommendations for Other Services       Precautions / Restrictions Precautions Precautions: Fall Restrictions Weight Bearing Restrictions: No      Mobility  Bed Mobility Overal bed mobility: Modified Independent                Transfers Overall transfer level: Needs assistance Equipment used: Rolling walker (2 wheeled) Transfers: Sit to/from Stand Sit to Stand: Min guard            Ambulation/Gait Ambulation/Gait assistance: Min guard Gait Distance (Feet): 200 Feet Assistive device: Rolling walker (2 wheeled)       General Gait Details: reciprocal stepping pattern; fair step height/length; no buckling or LOB.  Feels performance is near baseline; limited by endurance deficits (since post-covid)  Stairs             Wheelchair Mobility    Modified Rankin (Stroke Patients Only)       Balance Overall balance assessment: Needs assistance Sitting-balance support: No upper extremity supported;Feet supported Sitting balance-Leahy Scale: Good     Standing balance support: Bilateral upper extremity supported Standing balance-Leahy Scale: Fair                               Pertinent Vitals/Pain Pain Assessment: No/denies pain    Home Living Family/patient expects to be discharged to:: Assisted living               Home Equipment: Walker - 2 wheels;Walker - 4 wheels      Prior Function Level of Independence: Independent with assistive device(s)         Comments: Mod indep for ADLs, household and community distances with RW vs 4WRW; does endorse at least single fall within previous six months.  Actively participating with HHPT at ALF     Hand Dominance        Extremity/Trunk Assessment   Upper Extremity Assessment Upper Extremity Assessment: Overall WFL for tasks assessed (grossly 4+/5 throughout; no focal weakness, sensory deficits)    Lower Extremity Assessment Lower Extremity Assessment: Overall WFL for tasks assessed (grossly 4+/5 throughout; no focal weakness, sensory deficits)       Communication   Communication: No difficulties  Cognition Arousal/Alertness: Awake/alert Behavior During Therapy: WFL for tasks assessed/performed Overall Cognitive Status: Within Functional Limits for tasks assessed  General Comments      Exercises Other Exercises Other Exercises: Toilet transfer, ambulatory with RW, cga/close sup; sit/stnad from standard toilet, cga/close sup   Assessment/Plan    PT Assessment Patient needs continued PT services  PT Problem List Decreased strength;Decreased range of motion;Decreased activity tolerance;Decreased balance;Decreased mobility;Decreased  coordination;Decreased knowledge of use of DME;Decreased cognition;Decreased safety awareness;Decreased knowledge of precautions       PT Treatment Interventions DME instruction;Gait training;Functional mobility training;Therapeutic activities;Therapeutic exercise;Balance training;Patient/family education    PT Goals (Current goals can be found in the Care Plan section)  Acute Rehab PT Goals Patient Stated Goal: to return home and continue PT PT Goal Formulation: With patient Time For Goal Achievement: 02/18/20 Potential to Achieve Goals: Good    Frequency Min 2X/week   Barriers to discharge        Co-evaluation               AM-PAC PT "6 Clicks" Mobility  Outcome Measure Help needed turning from your back to your side while in a flat bed without using bedrails?: None Help needed moving from lying on your back to sitting on the side of a flat bed without using bedrails?: None Help needed moving to and from a bed to a chair (including a wheelchair)?: None Help needed standing up from a chair using your arms (e.g., wheelchair or bedside chair)?: None Help needed to walk in hospital room?: None Help needed climbing 3-5 steps with a railing? : A Little 6 Click Score: 23    End of Session Equipment Utilized During Treatment: Gait belt Activity Tolerance: Patient tolerated treatment well Patient left: in bed;with call bell/phone within reach;with family/visitor present Nurse Communication: Mobility status PT Visit Diagnosis: Difficulty in walking, not elsewhere classified (R26.2)    Time: 2751-7001 PT Time Calculation (min) (ACUTE ONLY): 17 min   Charges:   PT Evaluation $PT Eval Low Complexity: 1 Low PT Treatments $Therapeutic Activity: 8-22 mins        Tommie Dejoseph H. Manson Passey, PT, DPT, NCS 02/04/20, 5:38 PM [phone]

## 2020-07-09 DIAGNOSIS — S069XAA Unspecified intracranial injury with loss of consciousness status unknown, initial encounter: Secondary | ICD-10-CM

## 2020-07-09 DIAGNOSIS — I609 Nontraumatic subarachnoid hemorrhage, unspecified: Secondary | ICD-10-CM

## 2020-07-09 HISTORY — DX: Unspecified intracranial injury with loss of consciousness status unknown, initial encounter: S06.9XAA

## 2020-07-09 HISTORY — DX: Nontraumatic subarachnoid hemorrhage, unspecified: I60.9

## 2020-08-16 ENCOUNTER — Ambulatory Visit: Payer: Medicare Other | Attending: Physical Medicine and Rehabilitation | Admitting: Speech Pathology

## 2020-08-16 ENCOUNTER — Other Ambulatory Visit: Payer: Self-pay

## 2020-08-16 ENCOUNTER — Ambulatory Visit: Payer: Medicare Other | Admitting: Physical Therapy

## 2020-08-16 VITALS — BP 133/57 | HR 61

## 2020-08-16 DIAGNOSIS — R269 Unspecified abnormalities of gait and mobility: Secondary | ICD-10-CM

## 2020-08-16 DIAGNOSIS — R2681 Unsteadiness on feet: Secondary | ICD-10-CM

## 2020-08-16 DIAGNOSIS — R4701 Aphasia: Secondary | ICD-10-CM | POA: Diagnosis not present

## 2020-08-16 DIAGNOSIS — R278 Other lack of coordination: Secondary | ICD-10-CM | POA: Insufficient documentation

## 2020-08-16 DIAGNOSIS — M6281 Muscle weakness (generalized): Secondary | ICD-10-CM | POA: Insufficient documentation

## 2020-08-16 DIAGNOSIS — G459 Transient cerebral ischemic attack, unspecified: Secondary | ICD-10-CM | POA: Diagnosis present

## 2020-08-16 DIAGNOSIS — R296 Repeated falls: Secondary | ICD-10-CM | POA: Diagnosis present

## 2020-08-16 NOTE — Therapy (Signed)
[facility] MAIN [facility] SERVICES [address] [place], [place], [address] Phone: [phone]   Fax:  [phone]  Speech Language Pathology Evaluation  Patient Details  Name: Kara Pennington MRN: [id number] Date of Birth: July 12, 1940 Referring Provider (SLP): Lanny Cramp   Encounter Date: 08/16/2020   End of Session - 08/16/20 1629    Visit Number 1    Number of Visits 25    Date for SLP Re-Evaluation 11/08/20    Authorization Type Medicare    Authorization Time Period 08/16/2020 thru 11/08/2020    Authorization - Visit Number 1    Progress Note Due on Visit 10    SLP Start Time 1415    SLP Stop Time  1515    SLP Time Calculation (min) 60 min    Activity Tolerance Patient tolerated treatment well           Past Medical History:  Diagnosis Date  . Anxiety   . Depression   . GERD (gastroesophageal reflux disease)   . Hypertension     Past Surgical History:  Procedure Laterality Date  . APPENDECTOMY    . PARATHYROIDECTOMY      There were no vitals filed for this visit.   Subjective Assessment - 08/16/20 1653    Subjective Pt pleasant, energized, good historian    Patient is accompained by: Family member    Currently in Pain? No/denies              SLP Evaluation [facility] - 08/16/20 1544      SLP Visit Information   SLP Received On 08/16/20    Referring Provider (SLP) Lanny Cramp    Onset Date 07/15/20    Medical Diagnosis TBI      Subjective   Patient/Family Stated Goal To be able to drive      General Information   HPI This 80 year old female with a history of COVID-19 infection 1 year ago, hypothyroidism, and recent fall downstairs on 07/15/2020. Pt with resultant Trace L parietal SAH, Trace R parietal, L frontal SAH and R frontoparietal SDH. Pt discharged from Inpatient rehab on Thursday August 11, 2020.    Behavioral/Cognition appropriate      Prior Functional Status   Cognitive/Linguistic Baseline Within  functional limits    Type of Home Independent living facility    Education Master's of Social Work    Vocation Retired      Copy Status Within Systems developer for tasks assessed      Auditory Comprehension   Overall Auditory Comprehension Appears within functional limits for tasks assessed      Visual Recognition/Discrimination   Discrimination Within Function Limits      Reading Comprehension   Reading Status Not tested      Expression   Primary Mode of Expression Verbal      Verbal Expression   Overall Verbal Expression Impaired    Initiation No impairment    Level of Generative/Spontaneous Verbalization Conversation    Repetition No impairment    Naming Impairment   Inconsistent high level word finding difficulty   Verbal Errors Inconsistent    Pragmatics No impairment      Written Expression   Dominant Hand Right      Oral Motor/Sensory Function   Overall Oral Motor/Sensory Function Appears within functional limits for tasks assessed      Motor Speech   Overall Motor Speech Appears within functional limits for tasks assessed  Standardized Assessments   Standardized Assessments  Cognitive Linguistic Quick Test   See impression statement for areas of impairment     Cognitive Linguistic Quick Test (Ages 18-69)   Attention --    Memory --    Executive Function --    Language --    Visuospatial Skills --    Severity Rating Total --    Composite Severity Rating --               SLP Education - 08/16/20 1628    Education Details Pt education regarding language and cognition deficits    Person(s) Educated Patient;Child(ren)    Methods Explanation    Comprehension Verbalized understanding              SLP Long Term Goals - 08/16/20 1652      SLP LONG TERM GOAL #1   Title With modified indepedence pt will utilize word finding strategies to communicate complex thoughts related to daily activities.    Period Weeks     Status New            Plan - 08/16/20 1645    Clinical Impression Statement Pt presents with functional cognitive abilities and intermittent higher level word finding deficits. Pt demonstrates cognitive abilities that were within normal limits on the standardized Cognitive Linguistic Quick Test. While she exhibits great growth in cognition, her expressive language abilities are c/b intermittent word finding difficulties when conveying complex information. This results in akward moments of silence within conversation. As such, skilled ST intervention is required to improve pt's ability to communication abstract/complex information and thus improve functional independence and reduce caregiver burden.    Speech Therapy Frequency 2x / week    Duration 12 weeks    Treatment/Interventions Patient/family education;Compensatory techniques;Language facilitation;SLP instruction and feedback    Potential to Achieve Goals Good    Potential Considerations Ability to learn/carryover information;Family/community support    Consulted and Agree with Plan of Care Patient;Family member/caregiver    Family Member Consulted pt's daughter           Patient will benefit from skilled therapeutic intervention in order to improve the following deficits and impairments:   Aphasia    Problem List Patient Active Problem List   Diagnosis Date Noted  . TIA (transient ischemic attack) 02/03/2020  . Aphasia   . Acute bronchitis 10/06/2016   Arvin Abello B. Dreama Saa M.S., CCC-SLP, [facility] Speech-Language Pathologist Rehabilitation Services Office [phone]  Reuel Derby 08/16/2020, 4:58 PM  [facility] MAIN [facility] SERVICES [address] [place], [place], [address] Phone: [phone]   Fax:  [phone]  Name: Kara Pennington MRN: [id number] Date of Birth: [birth year]

## 2020-08-16 NOTE — Therapy (Signed)
[facility] MAIN [facility] SERVICES [address] [place], [place], [address] Phone: [phone]   Fax:  [phone]  Physical Therapy Evaluation  Patient Details  Name: Kara Pennington MRN: [id number] Date of Birth: [birth year] Referring Provider (PT): Dr. Lanny Cramp   Encounter Date: 08/16/2020   PT End of Session - 08/16/20 1635    Visit Number 1    Number of Visits 25    Date for PT Re-Evaluation 11/08/20    Authorization Type eval: 02/08    PT Start Time 1515    PT Stop Time 1615    PT Time Calculation (min) 60 min    Equipment Utilized During Treatment Gait belt    Activity Tolerance Patient tolerated treatment well;Patient limited by fatigue    Behavior During Therapy [facility] for tasks assessed/performed           Past Medical History:  Diagnosis Date  . Anxiety   . Depression   . GERD (gastroesophageal reflux disease)   . Hypertension     Past Surgical History:  Procedure Laterality Date  . APPENDECTOMY    . PARATHYROIDECTOMY      Vitals:   08/16/20 1627 08/16/20 1629 08/16/20 1630  BP: (!) 118/40 (!) 102/57 (!) 133/57  Pulse: 61    SpO2: 96%        Subjective Assessment - 08/16/20 1528    Subjective Patient presents to skilled physical therapy in manual wheelchair with no pain or injuries in the past week.    Patient is accompained by: Family member    Pertinent History 80 y.o. female presents to skilled physical therapy with a history of COVID-19 infection 1 year ago, hypothyroidism, and fall on 07/15/2020 with resultant nondisplaced right temporal bone fracture with associated minimal left parietal subarachnoid hemorrhage and a resolved right frontotemporal subdural hematoma who presented to [facility] hospital on 07/23/20 with intermitten but worsening word finding difficulties and expressive aphasia. While in the hospital, she had approximately 24 hours of speaking "in gibberish" and unable to speak. After about 1 day, it  was much improved and she was transferred to [facility]. She normally lives in an independent living apartment. During the week, daughter stated that she was overall doing better though she still would mix up words. On the day of admit, the daughter spoke to her at around noon and she said they had a "normal" conversation. She arrived at [facility] to visit with her at 2:15 and immediately felt that her mother was not well. She was back to speaking non-sensically. Daughter requested return to [facility]. Upon arrival she was able to follow commands and answer some questions accurately, however, she was having considerable word finding difficulties and seemed somewhat frustrated. 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    Limitations Standing;Walking    How long can you sit comfortably? unlimited time    How long can you stand comfortably? less than 5 mins    How long can you walk comfortably? less than 5 mins    Patient Stated Goals "I want to be able to get back to church and rejoin the knitting club"    Currently in Pain? No/denies              [facility] PT Assessment - 08/16/20 1522      Assessment   Medical Diagnosis TBI/Seizure/SAH/SDH    Referring Provider (PT) Dr. Lanny Cramp    Onset Date/Surgical Date 07/15/20  Hand Dominance Right    Prior Therapy Good outcomes with inpatient rehab      Precautions   Precautions Fall      Restrictions   Weight Bearing Restrictions No      Balance Screen   Has the patient fallen in the past 6 months Yes    How many times? 2    Has the patient had a decrease in activity level because of a fear of falling?  Yes    Is the patient reluctant to leave their home because of a fear of falling?  Yes      Home Environment   Living Environment Assisted living    Home Equipment Wheelchair - manual;Walker - 2 wheels;Walker - 4 wheels;Cane - quad;Tub bench;Grab bars - tub/shower;Grab bars  - toilet      Prior Function   Level of Independence Independent    Vocation Retired      Copy Status Within Functional Limits for tasks assessed      Observation/Other Assessments   Focus on Therapeutic Outcomes (FOTO)  45    Other Surveys  Activities of Balance and Confidence Scale    Activities of Balance Confidence Scale (ABC Scale)  48.125%      Balance   Balance Assessed Yes      Standardized Balance Assessment   Standardized Balance Assessment Timed Up and Go Test;Five Times Sit to Stand;10 meter walk test    Five times sit to stand comments  19s without UE support    10 Meter Walk 0.37 m/s with 2WW      Timed Up and Go Test   TUG Normal TUG    Normal TUG (seconds) 33.43   with 2WW          OBJECTIVE  Gait Patient demonstrates increased forward flexion with ambulating requiring 2WW and close CGA. She demonstrates decreased step and stride length causing a shuffling gait pattern.   Strength Patient's gross strength in BLE is 4/5 with no pain with any testing.    NEUROLOGICAL:   Sensation Grossly intact to light touch bilateral UEs/LEs as determined by testing dermatomes C2-T2/L2-S2 respectively Proprioception and hot/cold testing deferred on this date   Coordination/Cerebellar Finger to Nose: WNL Heel to Shin: WNL Rapid alternating movements: WNL Finger Opposition: WNL   FUNCTIONAL OUTCOME MEASURES FOTO: 45 5STS: 19s without BUE support : 0.37 m/s with BUE support  TUG: 33.43s with 2WW ABC: 48.25%   ASSESSMENT Clinical Impression: 80 y.o. female presents to skilled physical therapy referred for difficulty with mobility and balance secondary to hospitalization for subdural hematoma and traumatic brain injury due to a ground level fall. She demonstrates decreased balance skills and placed at a high risk falls as indicated by 33.43 seconds. She has significant weakness in her BLE as evidenced by completing five times sit  to stand for 19 seconds and 10 meter walk test for 0.37 m/s with 2WW. Patient's blood pressure was checked in different positions and demonstrated orthostatic hypotension in sitting and standing. Patient's daughter was educated on monitoring BP and reach out to PCP if BP readings continue to being low. Patient presents with deficits in strength, gait, and balance. Patient will benefit from skilled PT services to address deficits in balance and decrease risk for future falls.    PLAN Next Visit: Initiate BLE strengthening and balance exercises        PT Education - 08/16/20 1635    Education Details Monitoring BP for orthostatic  hypotension    Person(s) Educated Patient;Child(ren)    Methods Explanation;Demonstration    Comprehension Verbalized understanding            PT Short Term Goals - 08/16/20 1645      PT SHORT TERM GOAL #1   Title Patient will be independent in home exercise program to improve strength/mobility for better functional independence with ADLs.    Time 6    Period Weeks    Status New    Target Date 09/27/20             PT Long Term Goals - 08/16/20 1649      PT LONG TERM GOAL #1   Title Patient will increase FOTO score to equal to or greater than to 61 demonstrate statistically significant improvement in mobility and quality of life.    Baseline 02/08: 45    Time 12    Period Weeks    Status New    Target Date 11/08/20      PT LONG TERM GOAL #2   Title Patient (> 44 years old) will complete five times sit to stand test in < 15 seconds indicating an increased LE strength and improved balance.    Baseline 02/08: 19s without BUE support    Time 12    Period Weeks    Status New    Target Date 11/08/20      PT LONG TERM GOAL #3   Title Patient will increase 10 meter walk test to >1.64m/s as to improve gait speed for better community ambulation and to reduce fall risk.    Baseline 02/08:0.37 m/s    Time 12    Period Weeks    Status New    Target  Date 11/08/20      PT LONG TERM GOAL #4   Title Patient will reduce timed up and go to <11 seconds to reduce fall risk and demonstrate improved transfer/gait ability.    Baseline 02/08: 33.43 with 2WW    Time 12    Period Weeks    Status New    Target Date 11/08/20      PT LONG TERM GOAL #5   Title Patient will increase ABC scale score >80% to demonstrate better functional mobility and better confidence with ADLs.    Baseline 02/08: 48.25%    Time 12    Period Weeks    Status New    Target Date 11/08/20                  Plan - 08/16/20 1636    Clinical Impression Statement 79 y.o. female presents to skilled physical therapy referred for difficulty with mobility and balance secondary to hospitalization for subdural hematoma and traumatic brain injury due to a ground level fall. She demonstrates decreased balance skills and placed at a high risk falls as indicated by 33.43 seconds. She has significant weakness in her BLE as evidenced by completing five times sit to stand for 19 seconds and 10 meter walk test for 0.37 m/s with 2WW. Patient's blood pressure was checked in different positions and demonstrated orthostatic hypotension in sitting and standing. Patient's daughter was educated on monitoring BP and reach out to PCP if BP readings continue to being low. Patient presents with deficits in strength, gait, and balance. Patient will benefit from skilled PT services to address deficits in balance and decrease risk for future falls.    Personal Factors and Comorbidities Age;Comorbidity 3+    Comorbidities TIA, aphasia,  acute bronchitis    Examination-Activity Limitations Stairs;Squat;Bend;Stand;Transfers;Lift    Examination-Participation Restrictions [employer]    Stability/Clinical Decision Making Evolving/Moderate complexity    Clinical Decision Making Moderate    Rehab Potential Fair    PT Frequency 2x / week    PT Duration 12 weeks    PT Treatment/Interventions ADLs/Self Care  Home Management;Canalith Repostioning;Moist Heat;Traction;Electrical Stimulation;Gait training;Stair training;Functional mobility training;Therapeutic activities;Therapeutic exercise;Balance training;Neuromuscular re-education;Patient/family education;Passive range of motion;Energy conservation    PT Next Visit Plan Initiate strengthening and balance exercises    PT Home Exercise Plan see above    Consulted and Agree with Plan of Care Patient;Family member/caregiver    Family Member Consulted daughter           Patient will benefit from skilled therapeutic intervention in order to improve the following deficits and impairments:  Abnormal gait,Decreased endurance,Cardiopulmonary status limiting activity,Decreased activity tolerance,Decreased strength,Decreased balance,Decreased mobility,Difficulty walking,Improper body mechanics,Decreased safety awareness  Visit Diagnosis: Muscle weakness (generalized)  Unsteadiness on feet  Repeated falls  Gait abnormality     Problem List Patient Active Problem List   Diagnosis Date Noted  . TIA (transient ischemic attack) 02/03/2020  . Aphasia   . Acute bronchitis 10/06/2016   Jillyn Hidden PT, DPT Amelia Jo 08/16/2020, 5:25 PM  [facility] MAIN [facility] SERVICES [address] [place], [place], [address] Phone: [phone]   Fax:  [phone]  Name: Kara Pennington MRN: [id number] Date of Birth: [birth year]

## 2020-08-19 ENCOUNTER — Ambulatory Visit: Payer: Medicare Other | Admitting: Occupational Therapy

## 2020-08-19 ENCOUNTER — Other Ambulatory Visit: Payer: Self-pay

## 2020-08-19 ENCOUNTER — Ambulatory Visit: Payer: Medicare Other | Admitting: Speech Pathology

## 2020-08-19 ENCOUNTER — Ambulatory Visit: Payer: Medicare Other

## 2020-08-19 ENCOUNTER — Encounter: Payer: Self-pay | Admitting: Occupational Therapy

## 2020-08-19 VITALS — BP 122/49 | HR 55

## 2020-08-19 DIAGNOSIS — M6281 Muscle weakness (generalized): Secondary | ICD-10-CM

## 2020-08-19 DIAGNOSIS — R278 Other lack of coordination: Secondary | ICD-10-CM

## 2020-08-19 DIAGNOSIS — R269 Unspecified abnormalities of gait and mobility: Secondary | ICD-10-CM

## 2020-08-19 DIAGNOSIS — R4701 Aphasia: Secondary | ICD-10-CM | POA: Diagnosis not present

## 2020-08-19 DIAGNOSIS — R2681 Unsteadiness on feet: Secondary | ICD-10-CM

## 2020-08-19 NOTE — Therapy (Signed)
[facility] [facility] MAIN [facility] SERVICES [address] [place], [place], [address] Phone: [phone]   Fax:  [phone]  Physical Therapy Treatment  Patient Details  Name: Kara Pennington MRN: [id number] Date of Birth: 1941/01/04 Referring Provider (PT): Dr. Lanny Cramp   Encounter Date: 08/19/2020   PT End of Session - 08/19/20 1018    Visit Number 2    Number of Visits 25    Date for PT Re-Evaluation 11/08/20    Authorization Type eval: 02/08    PT Start Time 0953    PT Stop Time 1049    PT Time Calculation (min) 56 min    Equipment Utilized During Treatment Gait belt    Activity Tolerance Patient tolerated treatment well;Patient limited by fatigue    Behavior During Therapy [facility] for tasks assessed/performed           Past Medical History:  Diagnosis Date  . Anxiety   . Depression   . GERD (gastroesophageal reflux disease)   . Hypertension     Past Surgical History:  Procedure Laterality Date  . APPENDECTOMY    . PARATHYROIDECTOMY      Vitals:   08/19/20 1123 08/19/20 1124  BP: (!) 120/54 (!) 122/49  Pulse: (!) 55 (!) 55  SpO2: 98% 100%     Subjective Assessment - 08/19/20 0949    Subjective Pt reports no pain today and no recent falls. Pt reports she feels fall on Jan. 7th has set her back. Pt would like to get back to driving. She has follow-up with her physician next week.    Patient is accompained by: Family member    Pertinent History 80 y.o. female presents to skilled physical therapy with a history of COVID-19 infection 1 year ago, hypothyroidism, and fall on 07/15/2020 with resultant nondisplaced right temporal bone fracture with associated minimal left parietal subarachnoid hemorrhage and a resolved right frontotemporal subdural hematoma who presented to [facility] hospital on 07/23/20 with intermitten but worsening word finding difficulties and expressive aphasia. While in the hospital, she had approximately 24 hours of  speaking "in gibberish" and unable to speak. After about 1 day, it was much improved and she was transferred to [facility]. She normally lives in an independent living apartment. During the week, daughter stated that she was overall doing better though she still would mix up words. On the day of admit, the daughter spoke to her at around noon and she said they had a "normal" conversation. She arrived at [facility] to visit with her at 2:15 and immediately felt that her mother was not well. She was back to speaking non-sensically. Daughter requested return to [facility]. Upon arrival she was able to follow commands and answer some questions accurately, however, she was having considerable word finding difficulties and seemed somewhat frustrated. 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    Limitations Standing;Walking    How long can you sit comfortably? unlimited time    How long can you stand comfortably? less than 5 mins    How long can you walk comfortably? less than 5 mins    Patient Stated Goals "I want to be able to get back to church and rejoin the knitting club"    Currently in Pain? No/denies          BP, seated, at beginning of therapy: 120/54, HR 55, SPO2 98%; Pt denies sx of dizziness, nausea, light-headedness BP after nustep: 122/49 HR  55 SPO2 100; Pt denies any sx including dizziness/light-headedness  Therapeutic Exercise:  Nustep level 1 x 10 minutes; closely supervised Rest break at 2 minutes RPE 7/10; SPO2 100%, HR 110 bpm; pt educated on breathing technique, SPM 45-49, pt cued to increase SPM >50 Rest break at 7.5 minutes RPE 7/10; VC for breathing technique; pt continues to deny sx ; Pt able to achieve 55 SPM with frequent VC for remaining minutes  Seated LAQ - 1x20 4/10 RPE; VC/Demonstration for technique  Manual resisted LLE curls due to LLE pain 1x15 modified to the below exercise  RTB 2x15 hamstring curls  around heel B LEs. No c/o pain. Rest break between sets.  RTB seated hip abduction 2x15 rates exercise "medium," seated rest breaks between sets; VC/demonstration for technique   Seated marches 2x20 with seated rest breaks between sets; VC/demonstration for technique; pt states exercise "starts out easy and gets hard"  Assessment: Pt requires frequent rest breaks throughout session and is cued on breathing technique, but is able to return to exercise after approx 1 minute of rest each break. Pt vitals were 120/54, HR 55, SPO2 98% at beginning of therapy and 122/49 HR 55 SPO2 100 after ten minutes on nustep. Pt denies any sx including dizziness/light-headedness after exercises. Pt has follow-up with her physician next week and was instructed to report her low diastolic BP to her physician. Pt verbalized understanding. Pt will benefit from further skilled therapy to improve BLE strength, balance, and functional capacity/endurance for activities in order to improve safety and QOL.      PT Short Term Goals - 08/16/20 1645      PT SHORT TERM GOAL #1   Title Patient will be independent in home exercise program to improve strength/mobility for better functional independence with ADLs.    Time 6    Period Weeks    Status New    Target Date 09/27/20             PT Long Term Goals - 08/16/20 1649      PT LONG TERM GOAL #1   Title Patient will increase FOTO score to equal to or greater than to 61 demonstrate statistically significant improvement in mobility and quality of life.    Baseline 02/08: 45    Time 12    Period Weeks    Status New    Target Date 11/08/20      PT LONG TERM GOAL #2   Title Patient (> 63 years old) will complete five times sit to stand test in < 15 seconds indicating an increased LE strength and improved balance.    Baseline 02/08: 19s without BUE support    Time 12    Period Weeks    Status New    Target Date 11/08/20      PT LONG TERM GOAL #3   Title Patient  will increase 10 meter walk test to >1.63m/s as to improve gait speed for better community ambulation and to reduce fall risk.    Baseline 02/08:0.37 m/s    Time 12    Period Weeks    Status New    Target Date 11/08/20      PT LONG TERM GOAL #4   Title Patient will reduce timed up and go to <11 seconds to reduce fall risk and demonstrate improved transfer/gait ability.    Baseline 02/08: 33.43 with 2WW    Time 12    Period Weeks    Status New  Target Date 11/08/20      PT LONG TERM GOAL #5   Title Patient will increase ABC scale score >80% to demonstrate better functional mobility and better confidence with ADLs.    Baseline 02/08: 48.25%    Time 12    Period Weeks    Status New    Target Date 11/08/20            Plan - 08/19/20 1119    Clinical Impression Statement Pt requires frequent rest breaks throughout session and is cued on breathing technique, but is able to return to exercise after approx 1 minute of rest each break. Pt vitals were 120/54, HR 55, SPO2 98% at beginning of therapy and 122/49 HR 55 SPO2 100 after ten minutes on nustep. Pt denies any sx including dizziness/light-headedness after exercises. Pt has follow-up with her physician next week and was instructed to report her low diastolic BP to her physician. Pt verbalized understanding. Pt will benefit from further skilled therapy to improve BLE strength, balance, and functional capacity/endurance for activities in order to improve safety and QOL.    Personal Factors and Comorbidities Age;Comorbidity 3+    Comorbidities TIA, aphasia, acute bronchitis    Examination-Activity Limitations Stairs;Squat;Bend;Stand;Transfers;Lift    Examination-Participation Restrictions [employer]    Stability/Clinical Decision Making Evolving/Moderate complexity    Rehab Potential Fair    PT Frequency 2x / week    PT Duration 12 weeks    PT Treatment/Interventions ADLs/Self Care Home Management;Canalith Repostioning;Moist  Heat;Traction;Electrical Stimulation;Gait training;Stair training;Functional mobility training;Therapeutic activities;Therapeutic exercise;Balance training;Neuromuscular re-education;Patient/family education;Passive range of motion;Energy conservation    PT Next Visit Plan Initiate strengthening and balance exercises, progress cardiovascular endurance training    PT Home Exercise Plan see above    Consulted and Agree with Plan of Care Patient;Family member/caregiver    Family Member Consulted daughter           Patient will benefit from skilled therapeutic intervention in order to improve the following deficits and impairments:  Abnormal gait,Decreased endurance,Cardiopulmonary status limiting activity,Decreased activity tolerance,Decreased strength,Decreased balance,Decreased mobility,Difficulty walking,Improper body mechanics,Decreased safety awareness  Visit Diagnosis: Muscle weakness (generalized)  Unsteadiness on feet  Gait abnormality     Problem List Patient Active Problem List   Diagnosis Date Noted  . TIA (transient ischemic attack) 02/03/2020  . Aphasia   . Acute bronchitis 10/06/2016   Temple Pacini PT, DPT 08/19/2020, 11:25 AM  [place] [facility] MAIN [facility] SERVICES [address] [place], [place], [address] Phone: [phone]   Fax:  [phone]  Name: Kara Pennington MRN: [id number] Date of Birth: 04-18-1941

## 2020-08-20 NOTE — Therapy (Addendum)
[place] [facility] MAIN [facility] SERVICES [address] [place], [place], [address] Phone: [phone]   Fax:  [phone]  Occupational Therapy Evaluation  Patient Details  Name: Kara Pennington MRN: [id number] Date of Birth: 1941-06-26 No data recorded  Encounter Date: 08/19/2020   OT End of Session - 08/20/20 2206    Visit Number 1    Number of Visits 24    Date for OT Re-Evaluation 11/11/20    OT Start Time 1100    OT Stop Time 1205    OT Time Calculation (min) 65 min    Activity Tolerance Patient tolerated treatment well    Behavior During Therapy [facility] for tasks assessed/performed           Past Medical History:  Diagnosis Date  . Anxiety   . Depression   . GERD (gastroesophageal reflux disease)   . Hypertension     Past Surgical History:  Procedure Laterality Date  . APPENDECTOMY    . PARATHYROIDECTOMY      There were no vitals filed for this visit.   Subjective Assessment - 08/20/20 2204    Subjective  Pt reports her daughter gave her a weekend at the beach for Christmas and she drove to [facility] to meet her daughter, was holding onto railing to go up stairs and fell back and hit her head on the sidewalk and had a brain bleed.  Family called 911 and she was transported to [facility]. She doesn't remember the fall or events of what happened.    Pertinent History Pt is an 80 year old female with a history of COVID-19 infection 1 year ago, hypothyroidism, and recent fall downstairs on 07/15/2020. Pt with resultant Trace L parietal SAH, Trace R parietal, L frontal SAH and R frontoparietal SDH. Pt discharged from Inpatient rehab on Thursday August 11, 2020.    Patient Stated Goals Pt would like to get back to driving, be as independent as she was before.    Currently in Pain? No/denies    Multiple Pain Sites No             [facility] OT Assessment - 08/20/20 2204      Assessment   Medical Diagnosis TBI    Onset Date/Surgical Date 07/15/20     Hand Dominance Right      Precautions   Precautions Fall      Restrictions   Weight Bearing Restrictions No      Balance Screen   Has the patient fallen in the past 6 months Yes    How many times? just the one current fall    Has the patient had a decrease in activity level because of a fear of falling?  Yes    Is the patient reluctant to leave their home because of a fear of falling?  Yes      Home  Environment   Family/patient expects to be discharged to: Assisted living    Living Arrangements Alone    Available Help at Discharge Family    Type of Home Assisted living    Home Access Level entry    Home Layout One level    Bathroom Copywriter, advertising;Door    [employer] Handicapped height    Bathroom Accessibility Yes    Home Biochemist, clinical - 4 wheels;Wheelchair - [employer] - 2 wheels;Cane - single point;Bedside commode;Shower seat;Grab bars - tub/shower;Grab bars - toilet    Additional Comments was walking with a walker prior to fall  Lives With Alone      Prior Function   Level of Independence Independent    Vocation Retired    Leisure likes to travel, loves [employer] and cruises.      ADL   Eating/Feeding Modified independent    Grooming Modified independent   requires assist with hair for styling   Upper Body Bathing Modified independent    Lower Body Bathing Modified independent    Upper Body Dressing Increased time    Lower Body Dressing Increased time    Toilet Transfer Modified independent    Toileting - Clothing Manipulation Increased time    Toileting -  Hygiene Increase time    Tub/Shower Transfer Supervision/safety      IADL   Prior Level of Function Shopping independent    Shopping Needs to be accompanied on any shopping trip    Prior Level of Function Light Housekeeping independent    Light Housekeeping All laundry must be done by others    Prior Level of Function Meal Prep independent    Meal Prep Needs to have meals prepared and  served    Prior Level of Function Best boy Relies on family or friends for transportation    Prior Level of Function Medication Managment independent    Medication Management Is responsible for taking medication in correct dosages at correct time    Prior Level of Function Financial Management independent    Financial Management Requires supervision/minimal cuing      Mobility   Mobility Status History of falls;Needs assist      Written Expression   Dominant Hand Right      Vision - History   Baseline Vision Wears glasses all the time      Cognition   Memory --   reports decrease in memory over time before fall   Problem Solving Appears intact      Observation/Other Assessments   Focus on Therapeutic Outcomes (FOTO)  45      Sensation   Light Touch Appears Intact    Stereognosis Appears Intact    Hot/Cold Appears Intact    Additional Comments no numbness or tingling in hands.      Coordination   Gross Motor Movements are Fluid and Coordinated Yes    Fine Motor Movements are Fluid and Coordinated No    9 Hole Peg Test Right;Left    Right 9 Hole Peg Test 33    Left 9 Hole Peg Test 36      AROM   Overall AROM  Within functional limits for tasks performed      Strength   Overall Strength Deficits    Overall Strength Comments BUE strength 4-/5 overall      Hand Function   Right Hand Grip (lbs) 30    Right Hand Lateral Pinch 9 lbs    Right Hand 3 Point Pinch 9 lbs    Left Hand Grip (lbs) 20    Left Hand Lateral Pinch 8 lbs    Left 3 point pinch 6 lbs          Lives at [facility]  Would like to travel again and specifically to be able to cruise again.  Has a goal to go to all the [employer], has been to 33 out of 63.   At eval:  Daughter is currently staying with patient and providing Assist for laundry, driving, daughter is setting out her clothes for her.  They have  been working on Careers information officerdownsizing stuff   [facility] but occasionally makes some meals in her apartment. Grocery shopping, microwave meals, snacks  Cleaning from facility once a week Bed is changed once a week by facility.   Pt reports a Fear of falling                OT Education - 08/20/20 2204    Education Details role of OT, goals, POC    Person(s) Educated Patient    Methods Explanation    Comprehension Verbalized understanding               OT Long Term Goals - 08/21/20 1951      OT LONG TERM GOAL #1   Title Pt will perform home exercise program with modified independence.    Baseline eval: no current program    Time 12    Period Weeks    Status New    Target Date 11/11/20      OT LONG TERM GOAL #2   Title Pt will complete dressing skills with modified independence with no rest breaks and in 20 mins or less.    Baseline eval:  increased time allowed, distant supervision    Time 6    Period Weeks    Status New    Target Date 09/30/20      OT LONG TERM GOAL #3   Title Pt will complete light homemaking skills with modified independence.    Baseline Eval:  daughter helping currently    Time 7512    Period Weeks    Status New    Target Date 11/11/20      OT LONG TERM GOAL #4   Title Pt will complete laundry with modified independence including tranporting items to and from laundry room.    Baseline Eval: Daughter performing    Time 1512    Period Weeks    Status New    Target Date 11/11/20      OT LONG TERM GOAL #5   Title Pt will improve grip strength bilaterally by 10# to assist with opening jars and containers.    Baseline Eval:  decreased grip    Time 6    Period Weeks    Status New    Target Date 09/30/20      Long Term Additional Goals   Additional Long Term Goals Yes      OT LONG TERM GOAL #6   Title Pt will improve ROM and strength to perform hair styling with modified independence    Baseline Eval:  Requires assist    Time 6    Period Weeks    Target Date  09/30/20      OT LONG TERM GOAL #7   Title Pt will obtain clothing from closet and drawers in preparation for self care/dressing tasks.    Baseline Eval:  Dtr performing    Time 6    Period Weeks    Status New    Target Date 09/30/20                 Plan - 08/21/20 1945    Clinical Impression Statement Pt is an 80 year old female with a history of COVID-19 infection 1 year ago, hypothyroidism, and recent fall downstairs on 07/15/2020. Pt with resultant Trace L parietal SAH, Trace R parietal, L frontal SAH and R frontoparietal SDH.  She presents with decreased balance, functional mobility, decreased strength, ROM, decreased activity tolerance which  is affecting her ability to fully perform select ADL and IADL tasks at home and in the community.  She would benefit from skilled OT services to maximize her safety and independence in necessary daily tasks.    OT Occupational Profile and History Detailed Assessment- Review of Records and additional review of physical, cognitive, psychosocial history related to current functional performance    Occupational performance deficits (Please refer to evaluation for details): ADL's;IADL's;Leisure;Social Participation    Body Structure / Function / Physical Skills ADL;Dexterity;ROM;Strength;Balance;Coordination;FMC;IADL;UE functional use;GMC;Mobility    Engineer, materials Environmental  Adaptations;Habits;Routines and Behaviors    Rehab Potential Good    Clinical Decision Making Several treatment options, min-mod task modification necessary    Comorbidities Affecting Occupational Performance: May have comorbidities impacting occupational performance    Modification or Assistance to Complete Evaluation  No modification of tasks or assist necessary to complete eval    OT Frequency 2x / week    OT Duration 12 weeks    OT Treatment/Interventions Self-care/ADL training;Cryotherapy;Therapeutic exercise;DME and/or AE  instruction;Functional Mobility Training;Cognitive remediation/compensation;Balance training;Neuromuscular education;Manual Therapy;Moist Heat;Contrast Bath;Passive range of motion;Therapeutic activities;Patient/family education    Consulted and Agree with Plan of Care Patient           Patient will benefit from skilled therapeutic intervention in order to improve the following deficits and impairments:   Body Structure / Function / Physical Skills: ADL,Dexterity,ROM,Strength,Balance,Coordination,FMC,IADL,UE functional use,GMC,Mobility Cognitive Skills: Memory Psychosocial Skills: Environmental  Adaptations,Habits,Routines and Behaviors   Visit Diagnosis: Muscle weakness (generalized)  Other lack of coordination    Problem List Patient Active Problem List   Diagnosis Date Noted  . TIA (transient ischemic attack) 02/03/2020  . Aphasia   . Acute bronchitis 10/06/2016   Ronnisha Felber T Arne Cleveland, OTR/L, CLT  Hammad Finkler 08/21/2020, 8:00 PM   [facility] MAIN [facility] SERVICES [address] [place], [place], [address] Phone: [phone]   Fax:  [phone]  Name: TEA COLLUMS MRN: [id number] Date of Birth: 12-Mar-1941

## 2020-08-21 NOTE — Addendum Note (Signed)
Addended by: Derrek Gu T on: 08/21/2020 08:05 PM   Modules accepted: Orders

## 2020-08-23 ENCOUNTER — Ambulatory Visit: Payer: Medicare Other

## 2020-08-23 ENCOUNTER — Other Ambulatory Visit: Payer: Self-pay

## 2020-08-23 ENCOUNTER — Ambulatory Visit: Payer: Medicare Other | Admitting: Speech Pathology

## 2020-08-23 DIAGNOSIS — M6281 Muscle weakness (generalized): Secondary | ICD-10-CM

## 2020-08-23 DIAGNOSIS — R269 Unspecified abnormalities of gait and mobility: Secondary | ICD-10-CM

## 2020-08-23 DIAGNOSIS — R2681 Unsteadiness on feet: Secondary | ICD-10-CM

## 2020-08-23 DIAGNOSIS — R4701 Aphasia: Secondary | ICD-10-CM | POA: Diagnosis not present

## 2020-08-23 NOTE — Therapy (Signed)
[facility] MAIN [facility] SERVICES [address] [place], [place], [address] Phone: [phone]   Fax:  [phone]  Physical Therapy Treatment  Patient Details  Name: Kara Pennington MRN: [id number] Date of Birth: [birth year] Referring Provider (PT): Dr. Lanny Cramp   Encounter Date: 08/23/2020   PT End of Session - 08/23/20 1436    Visit Number 3    Number of Visits 25    Date for PT Re-Evaluation 11/08/20    Authorization Type eval: 02/08    PT Start Time 1447    PT Stop Time 1530    PT Time Calculation (min) 43 min    Equipment Utilized During Treatment Gait belt    Activity Tolerance Patient tolerated treatment well;Patient limited by fatigue    Behavior During Therapy [facility] for tasks assessed/performed           Past Medical History:  Diagnosis Date  . Anxiety   . Depression   . GERD (gastroesophageal reflux disease)   . Hypertension     Past Surgical History:  Procedure Laterality Date  . APPENDECTOMY    . PARATHYROIDECTOMY      There were no vitals filed for this visit.   Subjective Assessment - 08/24/20 0715    Subjective Patient reports compliance with HEP. Presents with daughter who requests we watch her walking.    Patient is accompained by: Family member    Pertinent History 80 y.o. female presents to skilled physical therapy with a history of COVID-19 infection 1 year ago, hypothyroidism, and fall on 07/15/2020 with resultant nondisplaced right temporal bone fracture with associated minimal left parietal subarachnoid hemorrhage and a resolved right frontotemporal subdural hematoma who presented to [facility] hospital on 07/23/20 with intermitten but worsening word finding difficulties and expressive aphasia. While in the hospital, she had approximately 24 hours of speaking "in gibberish" and unable to speak. After about 1 day, it was much improved and she was transferred to [facility]. She normally lives in an  independent living apartment. During the week, daughter stated that she was overall doing better though she still would mix up words. On the day of admit, the daughter spoke to her at around noon and she said they had a "normal" conversation. She arrived at [facility] to visit with her at 2:15 and immediately felt that her mother was not well. She was back to speaking non-sensically. Daughter requested return to [facility]. Upon arrival she was able to follow commands and answer some questions accurately, however, she was having considerable word finding difficulties and seemed somewhat frustrated. 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    Limitations Standing;Walking    How long can you sit comfortably? unlimited time    How long can you stand comfortably? less than 5 mins    How long can you walk comfortably? less than 5 mins    Patient Stated Goals "I want to be able to get back to church and rejoin the knitting club"    Currently in Pain? No/denies                  BP at start of session:130/58  Standing with UE support next to support surface:  Marches with UE support and cues for weight shift 10x each LE Hip extension 10x each LE; slightly painful to R knee, modified for pain reduction  Hip abduction 10x each LE Modified squat with UE support 8x; very challenging  for patient airex pad: BUE support very challenging and fearful for patient to perform x 45 seconds    Standing with RW: three hedgehogs; tap and return to standing position x4 trials each LE, very fatiguing to patient for coordination, spatial awareness, and sequencing Ambulate 15 feet with RW and close CGA, patient demonstrates safe ambulation with quick velocity; x 2 trials   Seated:  Marches no UE support for core activation Heel toe raises 15x 6" step toe taps 10x each LE, x2 sets LAQ 10x each LE     vitals monitored throughout session.    Pt educated  throughout session about proper posture and technique with exercises. Improved exercise technique, movement at target joints, use of target muscles after min to mod verbal, visual, tactile cues.  Patient presents with excellent motivation to physical therapy session. She tolerates standing interventions well with only occasional knee pain compliant. Daughter is educated on safe mobility and transfers and verbalizes understanding. Due to patient's high motivation and excellent family support she has a good prognosis for increased mobility and independence. Pt will benefit from further skilled therapy to improve BLE strength, balance, and functional capacity/endurance for activities in order to improve safety and QOL.              PT Education - 08/23/20 1423    Education Details exercise technique, body mechanics, vital monitoring    Person(s) Educated Patient    Methods Explanation;Demonstration;Tactile cues;Verbal cues    Comprehension Verbalized understanding;Returned demonstration;Verbal cues required;Tactile cues required            PT Short Term Goals - 08/16/20 1645      PT SHORT TERM GOAL #1   Title Patient will be independent in home exercise program to improve strength/mobility for better functional independence with ADLs.    Time 6    Period Weeks    Status New    Target Date 09/27/20             PT Long Term Goals - 08/16/20 1649      PT LONG TERM GOAL #1   Title Patient will increase FOTO score to equal to or greater than to 61 demonstrate statistically significant improvement in mobility and quality of life.    Baseline 02/08: 45    Time 12    Period Weeks    Status New    Target Date 11/08/20      PT LONG TERM GOAL #2   Title Patient (> 37 years old) will complete five times sit to stand test in < 15 seconds indicating an increased LE strength and improved balance.    Baseline 02/08: 19s without BUE support    Time 12    Period Weeks    Status New     Target Date 11/08/20      PT LONG TERM GOAL #3   Title Patient will increase 10 meter walk test to >1.48m/s as to improve gait speed for better community ambulation and to reduce fall risk.    Baseline 02/08:0.37 m/s    Time 12    Period Weeks    Status New    Target Date 11/08/20      PT LONG TERM GOAL #4   Title Patient will reduce timed up and go to <11 seconds to reduce fall risk and demonstrate improved transfer/gait ability.    Baseline 02/08: 33.43 with 2WW    Time 12    Period Weeks    Status New  Target Date 11/08/20      PT LONG TERM GOAL #5   Title Patient will increase ABC scale score >80% to demonstrate better functional mobility and better confidence with ADLs.    Baseline 02/08: 48.25%    Time 12    Period Weeks    Status New    Target Date 11/08/20                 Plan - 08/24/20 0724    Clinical Impression Statement Patient presents with excellent motivation to physical therapy session. She tolerates standing interventions well with only occasional knee pain compliant. Daughter is educated on safe mobility and transfers and verbalizes understanding. Due to patient's high motivation and excellent family support she has a good prognosis for increased mobility and independence. Pt will benefit from further skilled therapy to improve BLE strength, balance, and functional capacity/endurance for activities in order to improve safety and QOL.    Personal Factors and Comorbidities Age;Comorbidity 3+    Comorbidities TIA, aphasia, acute bronchitis    Examination-Activity Limitations Stairs;Squat;Bend;Stand;Transfers;Lift    Examination-Participation Restrictions [employer]    Stability/Clinical Decision Making Evolving/Moderate complexity    Rehab Potential Fair    PT Frequency 2x / week    PT Duration 12 weeks    PT Treatment/Interventions ADLs/Self Care Home Management;Canalith Repostioning;Moist Heat;Traction;Electrical Stimulation;Gait training;Stair  training;Functional mobility training;Therapeutic activities;Therapeutic exercise;Balance training;Neuromuscular re-education;Patient/family education;Passive range of motion;Energy conservation    PT Next Visit Plan Initiate strengthening and balance exercises, progress cardiovascular endurance training    PT Home Exercise Plan see above    Consulted and Agree with Plan of Care Patient;Family member/caregiver    Family Member Consulted daughter           Patient will benefit from skilled therapeutic intervention in order to improve the following deficits and impairments:  Abnormal gait,Decreased endurance,Cardiopulmonary status limiting activity,Decreased activity tolerance,Decreased strength,Decreased balance,Decreased mobility,Difficulty walking,Improper body mechanics,Decreased safety awareness  Visit Diagnosis: Muscle weakness (generalized)  Unsteadiness on feet  Gait abnormality     Problem List Patient Active Problem List   Diagnosis Date Noted  . TIA (transient ischemic attack) 02/03/2020  . Aphasia   . Acute bronchitis 10/06/2016   Precious Bard, PT, DPT   08/24/2020, 7:27 AM  [place] [facility] MAIN [facility] SERVICES [address] [place], [place], [address] Phone: [phone]   Fax:  [phone]  Name: BESSYE STITH MRN: [id number] Date of Birth: [birth year]

## 2020-08-23 NOTE — Therapy (Signed)
[facility] MAIN [facility] SERVICES [address] [place], [place], [address] Phone: [phone]   Fax:  [phone]  Speech Language Pathology Treatment  Patient Details  Name: Kara Pennington MRN: [id number] Date of Birth: May 02, 1941 Referring Provider (SLP): Lanny Cramp   Encounter Date: 08/23/2020   End of Session - 08/23/20 1618    Visit Number 2    Number of Visits 25    Date for SLP Re-Evaluation 11/08/20    Authorization Type Medicare    Authorization Time Period 08/16/2020 thru 11/08/2020    Authorization - Visit Number 2    Progress Note Due on Visit 10    SLP Start Time 1400    SLP Stop Time  1445    SLP Time Calculation (min) 45 min    Activity Tolerance Patient tolerated treatment well           Past Medical History:  Diagnosis Date  . Anxiety   . Depression   . GERD (gastroesophageal reflux disease)   . Hypertension     Past Surgical History:  Procedure Laterality Date  . APPENDECTOMY    . PARATHYROIDECTOMY      There were no vitals filed for this visit.   Subjective Assessment - 08/23/20 1613    Subjective pt pleasant    Patient is accompained by: Family member   daughter, Vernona Rieger   Currently in Pain? No/denies          Neuro   ST   TX   NOTE          Treatment Data and Patient's Response to Treatment   Skilled intervention targeted pt's expressive language goals, specifically word finding. SLP facilitated session by introducing novel word game on iPad, "Wordscapes". Pt independently completed six levels of game with minimal assistance. SLP then discussed importance of synonyms and word finding. Pt voiced understanding and was given a worksheet in which pt had to identify three synonyms for the provided word. Pt required maximal assistance identifying synonyms at the word level, therefore, provided pt with the strategy (creating and writing one sentence containing the base word, then writing another sentence  replacing the base word with a synonym). Given this strategy, pt's accuracy increased; however pt continued to require moderate to maximal assistance. Given pt's word finding difficulty, skilled ST intervention is required to improve pt's ability to communicate abstract/complex information and thus improve functional independence and reduce caregiver burden.      ADULT SLP TREATMENT - 08/23/20 0001      General Information   Behavior/Cognition Alert;Cooperative;Pleasant mood    HPI 80 year old female with a history of COVID-19 infection 1 year ago, hypothyroidism, and recent fall downstairs on 07/15/2020. Pt with resultant Trace L parietal SAH, Trace R parietal, L frontal SAH and R frontoparietal SDH. Pt discharged from Inpatient rehab on Thursday August 11, 2020.      Treatment Provided   Treatment provided Cognitive-Linquistic      Pain Assessment   Pain Assessment No/denies pain      Cognitive-Linquistic Treatment   Treatment focused on Aphasia      Assessment / Recommendations / Plan   Plan Continue with current plan of care      Progression Toward Goals   Progression toward goals Progressing toward goals            SLP Education - 08/23/20 1617    Education Details Regarding word finding difficulty    Person(s) Educated Patient;Child(ren)    Methods  Explanation;Demonstration;Verbal cues;Handout    Comprehension Verbalized understanding;Returned demonstration;Verbal cues required;Need further instruction              SLP Long Term Goals - 08/16/20 1652      SLP LONG TERM GOAL #1   Title With modified indepedence pt will utilize word finding strategies to communicate complex thoughts related to daily activities.    Period Weeks    Status New            Plan - 08/23/20 1618    Speech Therapy Frequency 2x / week    Duration 12 weeks    Treatment/Interventions Patient/family education;Compensatory techniques;Language facilitation;SLP instruction and feedback     Potential to Achieve Goals Good    Potential Considerations Ability to learn/carryover information;Family/community support    SLP Home Exercise Plan provided; see pt instructions    Consulted and Agree with Plan of Care Patient;Family member/caregiver    Family Member Consulted pt's daughter           Patient will benefit from skilled therapeutic intervention in order to improve the following deficits and impairments:   Aphasia    Problem List Patient Active Problem List   Diagnosis Date Noted  . TIA (transient ischemic attack) 02/03/2020  . Aphasia   . Acute bronchitis 10/06/2016   Debarah Crape, Student-SLP  Debarah Crape 08/23/2020, 4:36 PM  [facility] MAIN [facility] SERVICES [address] [place], [place], [address] Phone: [phone]   Fax:  [phone]   Name: Kara Pennington MRN: [id number] Date of Birth: [birth year]

## 2020-08-23 NOTE — Patient Instructions (Signed)
1. Download Wordscapes on iPad  2. Create one sentence using word given on worksheet. Then, write the same sentence but create a synonym for the word given.

## 2020-08-26 ENCOUNTER — Encounter: Payer: Self-pay | Admitting: Occupational Therapy

## 2020-08-26 ENCOUNTER — Ambulatory Visit: Payer: Medicare Other | Admitting: Occupational Therapy

## 2020-08-26 ENCOUNTER — Other Ambulatory Visit: Payer: Self-pay

## 2020-08-26 ENCOUNTER — Ambulatory Visit: Payer: Medicare Other | Admitting: Speech Pathology

## 2020-08-26 DIAGNOSIS — R4701 Aphasia: Secondary | ICD-10-CM

## 2020-08-26 DIAGNOSIS — M6281 Muscle weakness (generalized): Secondary | ICD-10-CM

## 2020-08-26 DIAGNOSIS — R278 Other lack of coordination: Secondary | ICD-10-CM

## 2020-08-26 DIAGNOSIS — G459 Transient cerebral ischemic attack, unspecified: Secondary | ICD-10-CM

## 2020-08-26 NOTE — Patient Instructions (Signed)
Complete [facility] 8 - WORD FINDING - Pages 30-31 and 4

## 2020-08-26 NOTE — Therapy (Signed)
[facility] MAIN [facility] SERVICES [address] [place], [place], [address] Phone: [phone]   Fax:  [phone]  Speech Language Pathology Treatment  Patient Details  Name: Kara Pennington MRN: [id number] Date of Birth: 1941/05/23 Referring Provider (SLP): Lanny Cramp   Encounter Date: 08/26/2020   End of Session - 08/26/20 1243    Visit Number 3    Number of Visits 25    Date for SLP Re-Evaluation 11/08/20    Authorization Type Medicare    Authorization Time Period 08/16/2020 thru 11/08/2020    Authorization - Visit Number 2    Progress Note Due on Visit 10    SLP Start Time 1100    SLP Stop Time  1200    SLP Time Calculation (min) 60 min    Activity Tolerance Patient tolerated treatment well           Past Medical History:  Diagnosis Date  . Anxiety   . Depression   . GERD (gastroesophageal reflux disease)   . Hypertension     Past Surgical History:  Procedure Laterality Date  . APPENDECTOMY    . PARATHYROIDECTOMY      There were no vitals filed for this visit.   Subjective Assessment - 08/26/20 1241    Subjective pt plesant, eager to work, states that she is proud of her work with homework    Currently in Pain? No/denies            Neuro   ST   TX   NOTE          Treatment Data and Patient's Response to Treatment   Skilled treatment session focused on pt's expressive language goals. Pt arrived to session and stated that she was proud of herself (regarding the time spent on her homework). Pt should be extremely proud of herself. The sentences that she wrote targeting synonyms were extremely complex. SLP facilitated session by having pt focus on generative naming of more abstract categories. Pt was 83% accurate with providing objects within category, improving to 100% with minimal cues from SLP.   Pt continues to feel insecure about her expressive language skills and she continues to be eager and a good candidate  for working on higher level word finding deficits in order to increase her functional ability to communicate abstract/complex information.           SLP Education - 08/26/20 1243    Education Details higher hiearchial basic to complex word finding    Person(s) Educated Patient    Methods Explanation;Demonstration;Verbal cues    Comprehension Verbalized understanding;Returned demonstration;Verbal cues required;Need further instruction              SLP Long Term Goals - 08/16/20 1652      SLP LONG TERM GOAL #1   Title With modified indepedence pt will utilize word finding strategies to communicate complex thoughts related to daily activities.    Period Weeks    Status New            Plan - 08/26/20 1244    Speech Therapy Frequency 2x / week    Duration 12 weeks    Treatment/Interventions Patient/family education;Compensatory techniques;Language facilitation;SLP instruction and feedback;Functional tasks    Potential to Achieve Goals Good    SLP Home Exercise Plan provided; see pt instructions    Consulted and Agree with Plan of Care Patient           Patient will benefit from skilled therapeutic  intervention in order to improve the following deficits and impairments:   Aphasia  TIA (transient ischemic attack)    Problem List Patient Active Problem List   Diagnosis Date Noted  . TIA (transient ischemic attack) 02/03/2020  . Aphasia   . Acute bronchitis 10/06/2016   La Dibella B. Dreama Saa M.S., CCC-SLP, [facility] Speech-Language Pathologist Rehabilitation Services Office [phone]  Reuel Derby 08/26/2020, 12:46 PM  [place] [facility] MAIN [facility] SERVICES [address] [place], [place], [address] Phone: [phone]   Fax:  [phone]   Name: Kara Pennington MRN: [id number] Date of Birth: 1941-01-16

## 2020-08-28 NOTE — Therapy (Signed)
[facility] MAIN [facility] SERVICES [address] [place], [place], [address] Phone: [phone]   Fax:  [phone]  Occupational Therapy Treatment  Patient Details  Name: Kara Pennington MRN: [id number] Date of Birth: 1940-08-20 No data recorded  Encounter Date: 08/26/2020   OT End of Session - 08/28/20 1729    Visit Number 2    Number of Visits 24    Date for OT Re-Evaluation 11/11/20    OT Start Time 1015    OT Stop Time 1100    OT Time Calculation (min) 45 min    Activity Tolerance Patient tolerated treatment well    Behavior During Therapy [facility] for tasks assessed/performed           Past Medical History:  Diagnosis Date  . Anxiety   . Depression   . GERD (gastroesophageal reflux disease)   . Hypertension     Past Surgical History:  Procedure Laterality Date  . APPENDECTOMY    . PARATHYROIDECTOMY      There were no vitals filed for this visit.   Subjective Assessment - 08/28/20 1728    Subjective  Pt reports she has some lightheadedness at times, yesterday she felt like she was going to be dizzy but then it didn't happen    Pertinent History Pt is an 80 year old female with a history of COVID-19 infection 1 year ago, hypothyroidism, and recent fall downstairs on 07/15/2020. Pt with resultant Trace L parietal SAH, Trace R parietal, L frontal SAH and R frontoparietal SDH. Pt discharged from Inpatient rehab on Thursday August 11, 2020.    Patient Stated Goals Pt would like to get back to driving, be as independent as she was before.    Currently in Pain? No/denies    Multiple Pain Sites No            Therapeutic Exercise:   Patient seen for bilateral UE strengthening with 3# dowel initially and pt felt she could increase to 4#.  Performed shoulder flexion, ABD, ADD, chest press, diagonal patterns, elbow flexion/extension.  3 sets 10-12 reps each.  Therapist demonstration and verbal cues.  Rest breaks as needed.   Reaching tasks  multiple directions and planes of motion from seated position.   Response to tx:   Patient progressing well, required therapist demo and cues for UE exercises for proper form and technique.  Required occasional rest breaks.  Patient engaging in tasks at home with daughters and working towards increasing functional mobility to access elevator in her building.  Continue to work towards goals in plan of care to increase independence in daily tasks.                      OT Education - 08/28/20 1728    Education Details role of OT, goals, POC    Person(s) Educated Patient    Methods Explanation;Demonstration    Comprehension Verbalized understanding;Returned demonstration               OT Long Term Goals - 08/21/20 1951      OT LONG TERM GOAL #1   Title Pt will perform home exercise program with modified independence.    Baseline eval: no current program    Time 12    Period Weeks    Status New    Target Date 11/11/20      OT LONG TERM GOAL #2   Title Pt will complete dressing skills with modified independence with no rest  breaks and in 20 mins or less.    Baseline eval:  increased time allowed, distant supervision    Time 6    Period Weeks    Status New    Target Date 09/30/20      OT LONG TERM GOAL #3   Title Pt will complete light homemaking skills with modified independence.    Baseline Eval:  daughter helping currently    Time 37    Period Weeks    Status New    Target Date 11/11/20      OT LONG TERM GOAL #4   Title Pt will complete laundry with modified independence including tranporting items to and from laundry room.    Baseline Eval: Daughter performing    Time 15    Period Weeks    Status New    Target Date 11/11/20      OT LONG TERM GOAL #5   Title Pt will improve grip strength bilaterally by 10# to assist with opening jars and containers.    Baseline Eval:  decreased grip    Time 6    Period Weeks    Status New    Target Date 09/30/20       Long Term Additional Goals   Additional Long Term Goals Yes      OT LONG TERM GOAL #6   Title Pt will improve ROM and strength to perform hair styling with modified independence    Baseline Eval:  Requires assist    Time 6    Period Weeks    Target Date 09/30/20      OT LONG TERM GOAL #7   Title Pt will obtain clothing from closet and drawers in preparation for self care/dressing tasks.    Baseline Eval:  Dtr performing    Time 6    Period Weeks    Status New    Target Date 09/30/20                 Plan - 08/28/20 1729    Clinical Impression Statement Patient progressing well, required therapist demo and cues for UE exercises for proper form and technique.  Required occasional rest breaks.  Patient engaging in tasks at home with daughters and working towards increasing functional mobility to access elevator in her building.  Continue to work towards goals in plan of care to increase independence in daily tasks.    OT Occupational Profile and History Detailed Assessment- Review of Records and additional review of physical, cognitive, psychosocial history related to current functional performance    Occupational performance deficits (Please refer to evaluation for details): ADL's;IADL's;Leisure;Social Participation    Body Structure / Function / Physical Skills ADL;Dexterity;ROM;Strength;Balance;Coordination;FMC;IADL;UE functional use;GMC;Mobility    Engineer, materials Environmental  Adaptations;Habits;Routines and Behaviors    Rehab Potential Good    Clinical Decision Making Several treatment options, min-mod task modification necessary    Comorbidities Affecting Occupational Performance: May have comorbidities impacting occupational performance    Modification or Assistance to Complete Evaluation  No modification of tasks or assist necessary to complete eval    OT Frequency 2x / week    OT Duration 12 weeks    OT Treatment/Interventions  Self-care/ADL training;Cryotherapy;Therapeutic exercise;DME and/or AE instruction;Functional Mobility Training;Cognitive remediation/compensation;Balance training;Neuromuscular education;Manual Therapy;Moist Heat;Contrast Bath;Passive range of motion;Therapeutic activities;Patient/family education    Consulted and Agree with Plan of Care Patient           Patient will benefit from skilled therapeutic intervention  in order to improve the following deficits and impairments:   Body Structure / Function / Physical Skills: ADL,Dexterity,ROM,Strength,Balance,Coordination,FMC,IADL,UE functional use,GMC,Mobility Cognitive Skills: Memory Psychosocial Skills: Environmental  Adaptations,Habits,Routines and Behaviors   Visit Diagnosis: Muscle weakness (generalized)  Other lack of coordination    Problem List Patient Active Problem List   Diagnosis Date Noted  . TIA (transient ischemic attack) 02/03/2020  . Aphasia   . Acute bronchitis 10/06/2016   Jahel Wavra T Arne Cleveland, OTR/L, CLT  Jamariyah Johannsen 08/28/2020, 5:36 PM  [place] [facility] MAIN [facility] SERVICES [address] [place], [place], [address] Phone: [phone]   Fax:  [phone]  Name: Kara Pennington MRN: [id number] Date of Birth: [birth year]

## 2020-08-30 ENCOUNTER — Encounter: Payer: 59 | Admitting: Speech Pathology

## 2020-08-30 ENCOUNTER — Ambulatory Visit: Payer: 59

## 2020-09-01 ENCOUNTER — Ambulatory Visit: Payer: Medicare Other | Admitting: Speech Pathology

## 2020-09-01 ENCOUNTER — Ambulatory Visit: Payer: Medicare Other

## 2020-09-01 ENCOUNTER — Other Ambulatory Visit: Payer: Self-pay

## 2020-09-01 DIAGNOSIS — R4701 Aphasia: Secondary | ICD-10-CM

## 2020-09-01 DIAGNOSIS — R269 Unspecified abnormalities of gait and mobility: Secondary | ICD-10-CM

## 2020-09-01 DIAGNOSIS — R2681 Unsteadiness on feet: Secondary | ICD-10-CM

## 2020-09-01 DIAGNOSIS — R278 Other lack of coordination: Secondary | ICD-10-CM

## 2020-09-01 DIAGNOSIS — M6281 Muscle weakness (generalized): Secondary | ICD-10-CM

## 2020-09-01 DIAGNOSIS — R296 Repeated falls: Secondary | ICD-10-CM

## 2020-09-01 NOTE — Therapy (Signed)
[facility] MAIN [facility] SERVICES [address] [place], [place], [address] Phone: [phone]   Fax:  [phone]  Physical Therapy Treatment  Patient Details  Name: Kara Pennington MRN: [id number] Date of Birth: 26-Jun-1941 Referring Provider (PT): Dr. Lanny Cramp   Encounter Date: 09/01/2020   PT End of Session - 09/01/20 1504    Visit Number 4    Number of Visits 25    Date for PT Re-Evaluation 11/08/20    Authorization Type eval: 02/08    PT Start Time 0400    PT Stop Time 0443    PT Time Calculation (min) 43 min    Equipment Utilized During Treatment Gait belt    Activity Tolerance Patient tolerated treatment well;Patient limited by fatigue    Behavior During Therapy [facility] for tasks assessed/performed           Past Medical History:  Diagnosis Date  . Anxiety   . Depression   . GERD (gastroesophageal reflux disease)   . Hypertension     Past Surgical History:  Procedure Laterality Date  . APPENDECTOMY    . PARATHYROIDECTOMY      There were no vitals filed for this visit.   Subjective Assessment - 09/01/20 1610    Subjective Patient reports that she is doing well getting better.    Patient is accompained by: Family member    Pertinent History 80 y.o. female presents to skilled physical therapy with a history of COVID-19 infection 1 year ago, hypothyroidism, and fall on 07/15/2020 with resultant nondisplaced right temporal bone fracture with associated minimal left parietal subarachnoid hemorrhage and a resolved right frontotemporal subdural hematoma who presented to [facility] hospital on 07/23/20 with intermitten but worsening word finding difficulties and expressive aphasia. While in the hospital, she had approximately 24 hours of speaking "in gibberish" and unable to speak. After about 1 day, it was much improved and she was transferred to [facility]. She normally lives in an independent living apartment. During  the week, daughter stated that she was overall doing better though she still would mix up words. On the day of admit, the daughter spoke to her at around noon and she said they had a "normal" conversation. She arrived at [facility] to visit with her at 2:15 and immediately felt that her mother was not well. She was back to speaking non-sensically. Daughter requested return to [facility]. Upon arrival she was able to follow commands and answer some questions accurately, however, she was having considerable word finding difficulties and seemed somewhat frustrated. 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    Limitations Standing;Walking    How long can you sit comfortably? unlimited time    How long can you stand comfortably? less than 5 mins    How long can you walk comfortably? less than 5 mins    Patient Stated Goals "I want to be able to get back to church and rejoin the knitting club"    Currently in Pain? No/denies             BP at start of session:140/59 SpO2 99%   Ambulated in hall with RW CTA and wheelchair follow Patient was able to ambulate a total of 334ft with short seated rest breaks after 135 ft , 130 ft and 35 ft- cueing to walk closer to the RW     Seated:  Marches no UE support for core activation 2# ankle weights Heel  toe raises 15x 6" step toe taps 10x each LE, x2 sets LAQ 10x each LE 2# ankle weights Hedge hog taps to the side 2x10 each   vitals monitored throughout session.                            PT Education - 09/01/20 1633    Education Details gait pattern    Person(s) Educated Patient    Methods Explanation    Comprehension Verbalized understanding            PT Short Term Goals - 08/16/20 1645      PT SHORT TERM GOAL #1   Title Patient will be independent in home exercise program to improve strength/mobility for better functional independence with ADLs.    Time 6    Period  Weeks    Status New    Target Date 09/27/20             PT Long Term Goals - 08/16/20 1649      PT LONG TERM GOAL #1   Title Patient will increase FOTO score to equal to or greater than to 61 demonstrate statistically significant improvement in mobility and quality of life.    Baseline 02/08: 45    Time 12    Period Weeks    Status New    Target Date 11/08/20      PT LONG TERM GOAL #2   Title Patient (> 34 years old) will complete five times sit to stand test in < 15 seconds indicating an increased LE strength and improved balance.    Baseline 02/08: 19s without BUE support    Time 12    Period Weeks    Status New    Target Date 11/08/20      PT LONG TERM GOAL #3   Title Patient will increase 10 meter walk test to >1.36m/s as to improve gait speed for better community ambulation and to reduce fall risk.    Baseline 02/08:0.37 m/s    Time 12    Period Weeks    Status New    Target Date 11/08/20      PT LONG TERM GOAL #4   Title Patient will reduce timed up and go to <11 seconds to reduce fall risk and demonstrate improved transfer/gait ability.    Baseline 02/08: 33.43 with 2WW    Time 12    Period Weeks    Status New    Target Date 11/08/20      PT LONG TERM GOAL #5   Title Patient will increase ABC scale score >80% to demonstrate better functional mobility and better confidence with ADLs.    Baseline 02/08: 48.25%    Time 12    Period Weeks    Status New    Target Date 11/08/20                 Plan - 09/01/20 1652    Clinical Impression Statement The patient was motivated throughout session.  The patient was able to walk a total of 300 ft with RW, CGA and wheelchair follow.  The patient did require 2 seated short rest breaks throughout the 398ft.  Patient limited by decreased stamina and endurance.  The patient continues to benefit from additional skilled PT services to improve balance, endurance, LE strength and coordination for improved quality of life.     Personal Factors and Comorbidities Age;Comorbidity 3+    Comorbidities TIA,  aphasia, acute bronchitis    Examination-Activity Limitations Stairs;Squat;Bend;Stand;Transfers;Lift    Examination-Participation Restrictions [employer]    Stability/Clinical Decision Making Evolving/Moderate complexity    Rehab Potential Fair    PT Frequency 2x / week    PT Duration 12 weeks    PT Treatment/Interventions ADLs/Self Care Home Management;Canalith Repostioning;Moist Heat;Traction;Electrical Stimulation;Gait training;Stair training;Functional mobility training;Therapeutic activities;Therapeutic exercise;Balance training;Neuromuscular re-education;Patient/family education;Passive range of motion;Energy conservation    PT Next Visit Plan Initiate strengthening and balance exercises, progress cardiovascular endurance training    PT Home Exercise Plan see above    Consulted and Agree with Plan of Care Patient;Family member/caregiver    Family Member Consulted daughter           Patient will benefit from skilled therapeutic intervention in order to improve the following deficits and impairments:  Abnormal gait,Decreased endurance,Cardiopulmonary status limiting activity,Decreased activity tolerance,Decreased strength,Decreased balance,Decreased mobility,Difficulty walking,Improper body mechanics,Decreased safety awareness  Visit Diagnosis: Muscle weakness (generalized)  Other lack of coordination  Unsteadiness on feet  Gait abnormality  Repeated falls     Problem List Patient Active Problem List   Diagnosis Date Noted  . TIA (transient ischemic attack) 02/03/2020  . Aphasia   . Acute bronchitis 10/06/2016    Colin Mulders PT, DPT 09/01/2020, 4:57 PM  [place] [facility] MAIN [facility] SERVICES [address] [place], [place], [address] Phone: [phone]   Fax:  [phone]  Name: Kara Pennington MRN: [id number] Date of Birth: 1941-01-28

## 2020-09-01 NOTE — Therapy (Signed)
[facility] MAIN [facility] SERVICES [address] [place], [place], [address] Phone: [phone]   Fax:  [phone]  Speech Language Pathology Treatment  Patient Details  Name: REESE SENK MRN: [id number] Date of Birth: [birth year] Referring Provider (SLP): Lanny Cramp   Encounter Date: 09/01/2020   End of Session - 09/01/20 1642    Visit Number 4    Number of Visits 25    Date for SLP Re-Evaluation 11/08/20    Authorization Type Medicare    Authorization Time Period 08/16/2020 thru 11/08/2020    Authorization - Visit Number 3    Progress Note Due on Visit 10    SLP Start Time 1500    SLP Stop Time  1600    SLP Time Calculation (min) 60 min    Activity Tolerance Patient tolerated treatment well           Past Medical History:  Diagnosis Date  . Anxiety   . Depression   . GERD (gastroesophageal reflux disease)   . Hypertension     Past Surgical History:  Procedure Laterality Date  . APPENDECTOMY    . PARATHYROIDECTOMY      There were no vitals filed for this visit.   Subjective Assessment - 09/01/20 1640    Subjective pt stated "that was fun" when she turned in her homework; also stated "there are fewer words that I am getting stuck on"    Patient is accompained by: Family member    Currently in Pain? No/denies            Neuro   ST   TX   NOTE          Treatment Data and Patient's Response to Treatment    Skilled treatment session targeted word finding. Pt was accompanied by her daughter Marvia Pickles). Pt had completed previous homework using very complex words. She is pleased with her progress and enjoys completing the homework. SLP reviewed homework with pt. SLP facilitated high level word finding by using a novel game of TABOO. Pt was able to adequately describe target word in 6 of 7 opportunities and was able able to provide target word in 8 of 9 opportunities. This demonstrated improved progress with using alternate  words for describe of semi-complex to complex target words.   Pt and her daughter continue to report that pt is having enough word finding deficits to be "troubling" when communicating complex information or ideas. Therefore skilled ST intervention is required to increase pt's ability to express complex/abstract information thereby increasing her functional independence.         SLP Education - 09/01/20 1641    Education Details complex word finding    Person(s) Educated Patient;Child(ren)    Methods Explanation;Demonstration;Verbal cues    Comprehension Verbalized understanding;Returned demonstration;Verbal cues required;Need further instruction              SLP Long Term Goals - 08/16/20 1652      SLP LONG TERM GOAL #1   Title With modified indepedence pt will utilize word finding strategies to communicate complex thoughts related to daily activities.    Period Weeks    Status New            Plan - 09/01/20 1642    Speech Therapy Frequency 2x / week    Duration 12 weeks    Treatment/Interventions Patient/family education;Compensatory techniques;Language facilitation;SLP instruction and feedback;Functional tasks    Potential to Achieve Goals Good    SLP Home  Exercise Plan provided; see pt instructions    Consulted and Agree with Plan of Care Patient    Family Member Consulted pt's daughter           Patient will benefit from skilled therapeutic intervention in order to improve the following deficits and impairments:   Aphasia    Problem List Patient Active Problem List   Diagnosis Date Noted  . TIA (transient ischemic attack) 02/03/2020  . Aphasia   . Acute bronchitis 10/06/2016   Else Habermann B. Dreama Saa M.S., CCC-SLP, [facility] Speech-Language Pathologist Rehabilitation Services Office [phone]  Reuel Derby 09/01/2020, 4:43 PM  [place] [facility] MAIN [facility] SERVICES [address] [place], [place], [address] Phone:  [phone]   Fax:  [phone]   Name: MARYIAH OLVEY MRN: [id number] Date of Birth: Apr 21, 1941

## 2020-09-01 NOTE — Patient Instructions (Signed)
Complete the 2 pages of generating at least 2 items within higher level categories

## 2020-09-06 ENCOUNTER — Ambulatory Visit: Payer: Medicare Other | Admitting: Occupational Therapy

## 2020-09-06 ENCOUNTER — Ambulatory Visit: Payer: Medicare Other | Attending: Physical Medicine and Rehabilitation | Admitting: Speech Pathology

## 2020-09-06 ENCOUNTER — Encounter: Payer: Self-pay | Admitting: Occupational Therapy

## 2020-09-06 ENCOUNTER — Other Ambulatory Visit: Payer: Self-pay

## 2020-09-06 ENCOUNTER — Ambulatory Visit: Payer: Medicare Other

## 2020-09-06 VITALS — BP 111/64 | HR 99

## 2020-09-06 DIAGNOSIS — R2689 Other abnormalities of gait and mobility: Secondary | ICD-10-CM | POA: Diagnosis present

## 2020-09-06 DIAGNOSIS — R2681 Unsteadiness on feet: Secondary | ICD-10-CM | POA: Insufficient documentation

## 2020-09-06 DIAGNOSIS — M6281 Muscle weakness (generalized): Secondary | ICD-10-CM | POA: Insufficient documentation

## 2020-09-06 DIAGNOSIS — R296 Repeated falls: Secondary | ICD-10-CM | POA: Diagnosis present

## 2020-09-06 DIAGNOSIS — R278 Other lack of coordination: Secondary | ICD-10-CM | POA: Insufficient documentation

## 2020-09-06 DIAGNOSIS — G459 Transient cerebral ischemic attack, unspecified: Secondary | ICD-10-CM | POA: Diagnosis present

## 2020-09-06 DIAGNOSIS — R269 Unspecified abnormalities of gait and mobility: Secondary | ICD-10-CM | POA: Insufficient documentation

## 2020-09-06 DIAGNOSIS — R4701 Aphasia: Secondary | ICD-10-CM | POA: Diagnosis present

## 2020-09-06 NOTE — Therapy (Signed)
[facility] MAIN [facility] SERVICES [address] [place], [place], [address] Phone: [phone]   Fax:  [phone]  Physical Therapy Treatment  Patient Details  Name: Kara Pennington MRN: [id number] Date of Birth: 07/25/1940 Referring Provider (PT): Dr. Lanny Cramp   Encounter Date: 09/06/2020   PT End of Session - 09/06/20 1628    Visit Number 5    Number of Visits 25    Date for PT Re-Evaluation 11/08/20    Authorization Type eval: 02/08    PT Start Time 1358    PT Stop Time 1445    PT Time Calculation (min) 47 min    Equipment Utilized During Treatment Gait belt    Activity Tolerance Patient tolerated treatment well;Patient limited by fatigue    Behavior During Therapy [facility] for tasks assessed/performed;Anxious           Past Medical History:  Diagnosis Date  . Anxiety   . Depression   . GERD (gastroesophageal reflux disease)   . Hypertension     Past Surgical History:  Procedure Laterality Date  . APPENDECTOMY    . PARATHYROIDECTOMY      Vitals:   09/06/20 1359 09/06/20 1624 09/06/20 1625  BP: (!) 143/65 136/61 111/64  Pulse: (!) 49 (!) 48 99  SpO2: 99%       Subjective Assessment - 09/06/20 1358    Subjective Pt denies pain today, but reports she is tired. Pt reports dizziness. Pt reports when she feels dizzy she closes her eyes and starts seeing "images of buildings falling down on people, stuff like that." She says these images resolve when she opens her eyes. Pt reports this tends to occur when she's lying down and when she tries to get back up. Pt reports this has been happening for about two months or since she went to the hospital.    Patient is accompained by: Family member    Pertinent History 80 y.o. female presents to skilled physical therapy with a history of COVID-19 infection 1 year ago, hypothyroidism, and fall on 07/15/2020 with resultant nondisplaced right temporal bone fracture with associated minimal left  parietal subarachnoid hemorrhage and a resolved right frontotemporal subdural hematoma who presented to [facility] hospital on 07/23/20 with intermitten but worsening word finding difficulties and expressive aphasia. While in the hospital, she had approximately 24 hours of speaking "in gibberish" and unable to speak. After about 1 day, it was much improved and she was transferred to [facility]. She normally lives in an independent living apartment. During the week, daughter stated that she was overall doing better though she still would mix up words. On the day of admit, the daughter spoke to her at around noon and she said they had a "normal" conversation. She arrived at [facility] to visit with her at 2:15 and immediately felt that her mother was not well. She was back to speaking non-sensically. Daughter requested return to [facility] Upon arrival she was able to follow commands and answer some questions accurately, however, she was having considerable word finding difficulties and seemed somewhat frustrated. 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    Limitations Standing;Walking    How long can you sit comfortably? unlimited time    How long can you stand comfortably? less than 5 mins    How long can you walk comfortably? less than 5 mins    Patient Stated Goals "I want to be able to get  back to church and rejoin the knitting club"    Currently in Pain? No/denies         Treatment:  Assessment- Smooth pursuits: Pt with saccadic movements   Horizontal saccades: corrective saccade to L. Pt reports seeing "double" when looking to R and states "It's like I'm seeing two fingers instead of one"   Head Impulse Testing: Positive on R, Pt exhibits corrective saccade to L   Vergence WNL  Orthostatics  Seated:  136/61 HR 48 Standing: 111/64 HR 99  Therapeutic Exercise:  Ambulated in hall with RW, CGA and wheelchair follow for 291 ft up  and down hallway with two seated rest breaks. Frequent VC for proximity to RW, and VC/TC for technique with STS (2x), to RW after rest breaks.   Seated marches with 2# ankle weights 3x15, VC/TC/demonstration to avoid compensating with hip ER.  LAQ with 2# AW 1x20, 2x10 with 4# AW; pt rates exercise "medium"  Seated hip abduction with RT- B 1x20      PT Education - 09/06/20 1627    Education Details Pt educated on technique with seated marches, body mechanics    Person(s) Educated Patient    Methods Explanation;Demonstration;Tactile cues;Verbal cues    Comprehension Verbalized understanding;Returned demonstration            PT Short Term Goals - 08/16/20 1645      PT SHORT TERM GOAL #1   Title Patient will be independent in home exercise program to improve strength/mobility for better functional independence with ADLs.    Time 6    Period Weeks    Status New    Target Date 09/27/20             PT Long Term Goals - 08/16/20 1649      PT LONG TERM GOAL #1   Title Patient will increase FOTO score to equal to or greater than to 61 demonstrate statistically significant improvement in mobility and quality of life.    Baseline 02/08: 45    Time 12    Period Weeks    Status New    Target Date 11/08/20      PT LONG TERM GOAL #2   Title Patient (> 12 years old) will complete five times sit to stand test in < 15 seconds indicating an increased LE strength and improved balance.    Baseline 02/08: 19s without BUE support    Time 12    Period Weeks    Status New    Target Date 11/08/20      PT LONG TERM GOAL #3   Title Patient will increase 10 meter walk test to >1.33m/s as to improve gait speed for better community ambulation and to reduce fall risk.    Baseline 02/08:0.37 m/s    Time 12    Period Weeks    Status New    Target Date 11/08/20      PT LONG TERM GOAL #4   Title Patient will reduce timed up and go to <11 seconds to reduce fall risk and demonstrate improved  transfer/gait ability.    Baseline 02/08: 33.43 with 2WW    Time 12    Period Weeks    Status New    Target Date 11/08/20      PT LONG TERM GOAL #5   Title Patient will increase ABC scale score >80% to demonstrate better functional mobility and better confidence with ADLs.    Baseline 02/08: 48.25%    Time 12  Period Weeks    Status New    Target Date 11/08/20                 Plan - 09/06/20 1629    Clinical Impression Statement First part of session PT performed assessment of vestibular system and orthostatics due to pt c/o of dizziness and lightheadedness impacting her ADLs and balance with gait. Pt did demonstrate significant drop in BP going from a seated to standing position: BP 136/61, HR 48 (standing), BP 111/64, HR 99 (seated) with reports of feeling light-headed. Pt with positive Head Impulse Testing on R with L corrective saccade. Pt also demonstrated saccadic movements with smooth pursuits, and exhibited a corrective saccade to L with horizontal saccade testing, where pt also reported "seeing double." PT instructed pt and her daughter to discuss vestibular symptoms, and other symptoms such as the images pt sees when she feels dizzy with her PCP. PT also instructed pt to report drop in BP with position change to her PCP (numbers provided). Remainder of session dedicated to walking endurance and seated therex, as pt was very fatigued. Future session to follow-up with Gilberto Better testing. Pt will benefit from further skilled therapy to improve functional capacity, BLE strength, and balance to improve her QOL and safety with ADLs.    Personal Factors and Comorbidities Age;Comorbidity 3+    Comorbidities TIA, aphasia, acute bronchitis    Examination-Activity Limitations Stairs;Squat;Bend;Stand;Transfers;Lift    Examination-Participation Restrictions [employer]    Stability/Clinical Decision Making Evolving/Moderate complexity    Rehab Potential Fair    PT Frequency 2x / week     PT Duration 12 weeks    PT Treatment/Interventions ADLs/Self Care Home Management;Canalith Repostioning;Moist Heat;Traction;Electrical Stimulation;Gait training;Stair training;Functional mobility training;Therapeutic activities;Therapeutic exercise;Balance training;Neuromuscular re-education;Patient/family education;Passive range of motion;Energy conservation    PT Next Visit Plan Initiate strengthening and balance exercises, progress cardiovascular endurance training; Gilberto Better    PT Home Exercise Plan see above    Consulted and Agree with Plan of Care Patient;Family member/caregiver    Family Member Consulted daughter           Patient will benefit from skilled therapeutic intervention in order to improve the following deficits and impairments:  Abnormal gait,Decreased endurance,Cardiopulmonary status limiting activity,Decreased activity tolerance,Decreased strength,Decreased balance,Decreased mobility,Difficulty walking,Improper body mechanics,Decreased safety awareness  Visit Diagnosis: Muscle weakness (generalized)  Unsteadiness on feet  Gait abnormality  Other lack of coordination     Problem List Patient Active Problem List   Diagnosis Date Noted  . TIA (transient ischemic attack) 02/03/2020  . Aphasia   . Acute bronchitis 10/06/2016   Temple Pacini PT, DPT  Baird Kay 09/06/2020, 4:59 PM  [place] [facility] MAIN [facility] SERVICES [address] [place], [place], [address] Phone: [phone]   Fax:  [phone]  Name: Kara Pennington MRN: [id number] Date of Birth: November 02, 1940

## 2020-09-06 NOTE — Therapy (Signed)
[facility] MAIN [facility] SERVICES [address] [place], [place], [address] Phone: [phone]   Fax:  [phone]  Occupational Therapy Treatment  Patient Details  Name: Kara Pennington MRN: [id number] Date of Birth: February 04, 1941 No data recorded  Encounter Date: 09/06/2020   OT End of Session - 09/06/20 1402    Visit Number 3    Number of Visits 24    Date for OT Re-Evaluation 11/11/20    OT Start Time 1301    OT Stop Time 1355    OT Time Calculation (min) 54 min    Activity Tolerance Patient tolerated treatment well    Behavior During Therapy [facility] for tasks assessed/performed           Past Medical History:  Diagnosis Date  . Anxiety   . Depression   . GERD (gastroesophageal reflux disease)   . Hypertension     Past Surgical History:  Procedure Laterality Date  . APPENDECTOMY    . PARATHYROIDECTOMY      There were no vitals filed for this visit.   Subjective Assessment - 09/06/20 1358    Subjective  Pt reports she continues to experience light headedness, does not pass out, but is fearful of it.    Patient is accompanied by: Family member    Pertinent History Pt is an 80 year old female with a history of COVID-19 infection 1 year ago, hypothyroidism, and recent fall downstairs on 07/15/2020. Pt with resultant Trace L parietal SAH, Trace R parietal, L frontal SAH and R frontoparietal SDH. Pt discharged from Inpatient rehab on Thursday August 11, 2020.    Patient Stated Goals Pt would like to get back to driving, be as independent as she was before.    Currently in Pain? No/denies            Therapeutic Exercise:  Pt completed 2 set x 20 reps seated transverse abdominal contractions with cues for deep inspiration. 2 set x 20 reps seated functional reach outside of base of support in all 4 planes without UE support. Verbal and tactile cues t/o for technique. HEP reviewed with patient and daughter.  Self-Care:  Pt initially  required MIN A sup<>sit, demonstrated improved ability to exit bed using log-roll technique c VCs only. Pt endorses lightheadedness with sit>supine, improved with sit>sidelying>supine. Daughter present and educated on bed rail use. Pt completed washer/dryer IADL tasks removing 3 items from washer and placing in dryer. Cues required for rest breaks, single UE support for improved balance, and decreasing load size.                       OT Education - 09/06/20 1401    Education Details HEP, breathing exercises    Person(s) Educated Patient;Child(ren)    Methods Explanation;Demonstration    Comprehension Verbalized understanding;Returned demonstration               OT Long Term Goals - 08/21/20 1951      OT LONG TERM GOAL #1   Title Pt will perform home exercise program with modified independence.    Baseline eval: no current program    Time 12    Period Weeks    Status New    Target Date 11/11/20      OT LONG TERM GOAL #2   Title Pt will complete dressing skills with modified independence with no rest breaks and in 20 mins or less.    Baseline eval:  increased time allowed,  distant supervision    Time 6    Period Weeks    Status New    Target Date 09/30/20      OT LONG TERM GOAL #3   Title Pt will complete light homemaking skills with modified independence.    Baseline Eval:  daughter helping currently    Time 61    Period Weeks    Status New    Target Date 11/11/20      OT LONG TERM GOAL #4   Title Pt will complete laundry with modified independence including tranporting items to and from laundry room.    Baseline Eval: Daughter performing    Time 52    Period Weeks    Status New    Target Date 11/11/20      OT LONG TERM GOAL #5   Title Pt will improve grip strength bilaterally by 10# to assist with opening jars and containers.    Baseline Eval:  decreased grip    Time 6    Period Weeks    Status New    Target Date 09/30/20      Long Term  Additional Goals   Additional Long Term Goals Yes      OT LONG TERM GOAL #6   Title Pt will improve ROM and strength to perform hair styling with modified independence    Baseline Eval:  Requires assist    Time 6    Period Weeks    Target Date 09/30/20      OT LONG TERM GOAL #7   Title Pt will obtain clothing from closet and drawers in preparation for self care/dressing tasks.    Baseline Eval:  Dtr performing    Time 6    Period Weeks    Status New    Target Date 09/30/20                 Plan - 09/06/20 1403    Clinical Impression Statement Patient progressing well and reports needing less assistance for ADLs, continues to require assist from daughters for IADLs. Pt required cues for breathing tehcniques and abdominal HEP. Patient engaging in tasks at home with daughters and working towards increasing functional mobility to access dining building. Continue to work towards goals in plan of care to increase independence in daily tasks.    OT Occupational Profile and History Detailed Assessment- Review of Records and additional review of physical, cognitive, psychosocial history related to current functional performance    Occupational performance deficits (Please refer to evaluation for details): ADL's;IADL's;Leisure;Social Participation    Body Structure / Function / Physical Skills ADL;Dexterity;ROM;Strength;Balance;Coordination;FMC;IADL;UE functional use;GMC;Mobility    Engineer, materials Environmental  Adaptations;Habits;Routines and Behaviors    Rehab Potential Good    Clinical Decision Making Several treatment options, min-mod task modification necessary    Comorbidities Affecting Occupational Performance: May have comorbidities impacting occupational performance    Modification or Assistance to Complete Evaluation  No modification of tasks or assist necessary to complete eval    OT Frequency 2x / week    OT Duration 12 weeks    OT  Treatment/Interventions Self-care/ADL training;Cryotherapy;Therapeutic exercise;DME and/or AE instruction;Functional Mobility Training;Cognitive remediation/compensation;Balance training;Neuromuscular education;Manual Therapy;Moist Heat;Contrast Bath;Passive range of motion;Therapeutic activities;Patient/family education    Consulted and Agree with Plan of Care Patient           Patient will benefit from skilled therapeutic intervention in order to improve the following deficits and impairments:   Body Structure /  Function / Physical Skills: ADL,Dexterity,ROM,Strength,Balance,Coordination,FMC,IADL,UE functional use,GMC,Mobility Cognitive Skills: Memory Psychosocial Skills: Environmental  Adaptations,Habits,Routines and Behaviors   Visit Diagnosis: Other lack of coordination    Problem List Patient Active Problem List   Diagnosis Date Noted  . TIA (transient ischemic attack) 02/03/2020  . Aphasia   . Acute bronchitis 10/06/2016   Kathie Dike, M.S. OTR/L  09/06/20, 2:41 PM  ascom [phone]  [facility] MAIN [facility] SERVICES [address] [place], [place], [address] Phone: [phone]   Fax:  [phone]  Name: Kara Pennington MRN: [id number] Date of Birth: Sep 05, 1940

## 2020-09-07 NOTE — Therapy (Addendum)
[place] MAIN [facility] SERVICES [address] [place], [place], [address] Phone: [phone]   Fax:  [phone]  Speech Language Pathology Treatment  Patient Details  Name: Kara Pennington MRN: [id number] Date of Birth: [birth year] Referring Provider (SLP): Lanny Cramp   Encounter Date: 09/06/2020   End of Session - 09/07/20 1719    Visit Number 5    Number of Visits 25    Date for SLP Re-Evaluation 11/08/20    Authorization Type Medicare    Authorization Time Period 08/16/2020 thru 11/08/2020    Authorization - Visit Number 4    Progress Note Due on Visit 10    SLP Start Time 1500    SLP Stop Time  1600    SLP Time Calculation (min) 60 min    Activity Tolerance Patient tolerated treatment well           Past Medical History:  Diagnosis Date  . Anxiety   . Depression   . GERD (gastroesophageal reflux disease)   . Hypertension     Past Surgical History:  Procedure Laterality Date  . APPENDECTOMY    . PARATHYROIDECTOMY      There were no vitals filed for this visit.   Subjective Assessment - 09/07/20 1719    Subjective pt stated "that was fun" when she turned in her homework; also stated "there are fewer words that I am getting stuck on"    Patient is accompained by: Family member    Currently in Pain? No/denies          Neuro   ST   TX   NOTE          Treatment Data and Patient's Response to Treatment   Pt arrived to session with her daughter. Pt stated that she was fatigue as she had already participated in PT as well as OT sessions. Despite this fatigue, pt did not have any evidence of aphasia during spontaneous conversation as well as structured tasks. Structured tasks included answering complex trivia questions requiring pt to generate words that are not common. Pt completed without any errors. Discussed discharging pt from ST services. Will revisit during next session.            SLP Education - 09/07/20  1719    Education Details progress towards goals    Person(s) Educated Patient;Child(ren)    Methods Explanation;Demonstration;Verbal cues    Comprehension Verbalized understanding;Returned demonstration              SLP Long Term Goals - 08/16/20 1652      SLP LONG TERM GOAL #1   Title With modified indepedence pt will utilize word finding strategies to communicate complex thoughts related to daily activities.    Period Weeks    Status New            Plan - 09/07/20 1720        Speech Therapy Frequency 2x / week    Duration 12 weeks    Treatment/Interventions Patient/family education;Compensatory techniques;Language facilitation;SLP instruction and feedback;Functional tasks    Potential to Achieve Goals Good    SLP Home Exercise Plan provided; see pt instructions    Consulted and Agree with Plan of Care Patient           Patient will benefit from skilled therapeutic intervention in order to improve the following deficits and impairments:   Aphasia  TIA (transient ischemic attack)    Problem List Patient Active Problem List  Diagnosis Date Noted  . TIA (transient ischemic attack) 02/03/2020  . Aphasia   . Acute bronchitis 10/06/2016   Herron Fero B. Dreama Saa M.S., CCC-SLP, [facility] Speech-Language Pathologist Rehabilitation Services Office [phone]  Reuel Derby 09/07/2020, 5:25 PM  [place] [facility] MAIN [facility] SERVICES [address] [place], [place], [address] Phone: [phone]   Fax:  [phone]   Name: LANESSA SHILL MRN: [id number] Date of Birth: Feb 05, 1941

## 2020-09-08 ENCOUNTER — Other Ambulatory Visit: Payer: Self-pay

## 2020-09-08 ENCOUNTER — Ambulatory Visit: Payer: Medicare Other | Admitting: Occupational Therapy

## 2020-09-08 ENCOUNTER — Ambulatory Visit: Payer: Medicare Other | Admitting: Speech Pathology

## 2020-09-08 ENCOUNTER — Ambulatory Visit: Payer: Medicare Other

## 2020-09-08 DIAGNOSIS — R2681 Unsteadiness on feet: Secondary | ICD-10-CM

## 2020-09-08 DIAGNOSIS — R4701 Aphasia: Secondary | ICD-10-CM | POA: Diagnosis not present

## 2020-09-08 DIAGNOSIS — R278 Other lack of coordination: Secondary | ICD-10-CM

## 2020-09-08 DIAGNOSIS — M6281 Muscle weakness (generalized): Secondary | ICD-10-CM

## 2020-09-08 DIAGNOSIS — G459 Transient cerebral ischemic attack, unspecified: Secondary | ICD-10-CM

## 2020-09-08 DIAGNOSIS — R296 Repeated falls: Secondary | ICD-10-CM

## 2020-09-08 DIAGNOSIS — R269 Unspecified abnormalities of gait and mobility: Secondary | ICD-10-CM

## 2020-09-08 NOTE — Therapy (Signed)
[place] [facility] MAIN [facility] SERVICES [address] [place], [place], [address] Phone: [phone]   Fax:  [phone]  Physical Therapy Treatment  Patient Details  Name: Kara Pennington MRN: [id number] Date of Birth: 30-Oct-1940 Referring Provider (PT): Dr. Lanny Cramp   Encounter Date: 09/08/2020   PT End of Session - 09/08/20 1546    Visit Number 6    Number of Visits 25    Date for PT Re-Evaluation 11/08/20    Authorization Type eval: 02/08    PT Start Time 0403    PT Stop Time 0445    PT Time Calculation (min) 42 min    Equipment Utilized During Treatment Gait belt    Activity Tolerance Patient tolerated treatment well;Patient limited by fatigue    Behavior During Therapy [facility] for tasks assessed/performed;Anxious           Past Medical History:  Diagnosis Date  . Anxiety   . Depression   . GERD (gastroesophageal reflux disease)   . Hypertension     Past Surgical History:  Procedure Laterality Date  . APPENDECTOMY    . PARATHYROIDECTOMY      There were no vitals filed for this visit.   VOR x 2 exercise:  Demonstrated and educated as to VOR X 2. Patient performed VOR X 2 horiz and vertical  30 seconds each with verbal cues for technique.  Patient reports 0/10 dizziness.  Educated patient and patient's daughter to start with 1 rep of 30 seconds each and progress to 2 reps of 30 seconds each in next 2 days.   Dix hallpike to left side 3 seconds horizontal nystagmus Sidelying to sitting provoked symptoms x5 seconds with symptoms including "brains coming out of skull"   Dix hallpike to right 4 seconds of symptoms no nystagmus Sidelying to sitting provoked symptoms x7 seconds     Subjective Assessment - 09/08/20 1658    Subjective The patient reports she has been having trouble laying on her side or getting up due to having feelings of dizziness.  Patient is seeing a neurologist next week.    Patient is accompained by: Family  member    Pertinent History 80 y.o. female presents to skilled physical therapy with a history of COVID-19 infection 1 year ago, hypothyroidism, and fall on 07/15/2020 with resultant nondisplaced right temporal bone fracture with associated minimal left parietal subarachnoid hemorrhage and a resolved right frontotemporal subdural hematoma who presented to [facility] hospital on 07/23/20 with intermitten but worsening word finding difficulties and expressive aphasia. While in the hospital, she had approximately 24 hours of speaking "in gibberish" and unable to speak. After about 1 day, it was much improved and she was transferred to [facility] and Rehab. She normally lives in an independent living apartment. During the week, daughter stated that she was overall doing better though she still would mix up words. On the day of admit, the daughter spoke to her at around noon and she said they had a "normal" conversation. She arrived at [facility] to visit with her at 2:15 and immediately felt that her mother was not well. She was back to speaking non-sensically. Daughter requested return to [facility]. Upon arrival she was able to follow commands and answer some questions accurately, however, she was having considerable word finding difficulties and seemed somewhat frustrated. 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    Limitations Standing;Walking    How long can you sit comfortably? unlimited  time    How long can you stand comfortably? less than 5 mins    How long can you walk comfortably? less than 5 mins    Patient Stated Goals "I want to be able to get back to church and rejoin the knitting club"    Currently in Pain? No/denies                          Objective measurements completed on examination: See above findings.               PT Education - 09/08/20 1705    Education Details VORx1    Person(s) Educated  Patient;Child(ren)    Methods Explanation;Handout    Comprehension Verbalized understanding            PT Short Term Goals - 08/16/20 1645      PT SHORT TERM GOAL #1   Title Patient will be independent in home exercise program to improve strength/mobility for better functional independence with ADLs.    Time 6    Period Weeks    Status New    Target Date 09/27/20             PT Long Term Goals - 08/16/20 1649      PT LONG TERM GOAL #1   Title Patient will increase FOTO score to equal to or greater than to 61 demonstrate statistically significant improvement in mobility and quality of life.    Baseline 02/08: 45    Time 12    Period Weeks    Status New    Target Date 11/08/20      PT LONG TERM GOAL #2   Title Patient (> 42 years old) will complete five times sit to stand test in < 15 seconds indicating an increased LE strength and improved balance.    Baseline 02/08: 19s without BUE support    Time 12    Period Weeks    Status New    Target Date 11/08/20      PT LONG TERM GOAL #3   Title Patient will increase 10 meter walk test to >1.21m/s as to improve gait speed for better community ambulation and to reduce fall risk.    Baseline 02/08:0.37 m/s    Time 12    Period Weeks    Status New    Target Date 11/08/20      PT LONG TERM GOAL #4   Title Patient will reduce timed up and go to <11 seconds to reduce fall risk and demonstrate improved transfer/gait ability.    Baseline 02/08: 33.43 with 2WW    Time 12    Period Weeks    Status New    Target Date 11/08/20      PT LONG TERM GOAL #5   Title Patient will increase ABC scale score >80% to demonstrate better functional mobility and better confidence with ADLs.    Baseline 02/08: 48.25%    Time 12    Period Weeks    Status New    Target Date 11/08/20                  Plan - 09/08/20 1706    Clinical Impression Statement Performed dix hall pike today per recommendation of previous therapist.  The  patient hesitant and fearful to get into the position.  Patient with positive dix hallpike for symptoms bilaterally lasting <5 seconds.  Patient with horizontal nystagmus to the left.  Maneuver for canalithiasis was not performed this visit due to patient being "worn out" after dix hallpike tests.  The patient did perform 1 repetition of VORx1 exercise for 30 seconds with head turns and nods without increase in symptoms.  The patient continues to benefit from additional skilled PT services to improve symptoms, progress LE strengthening, gait and balance for improved quality of life.    Personal Factors and Comorbidities Age;Comorbidity 3+    Comorbidities TIA, aphasia, acute bronchitis    Examination-Activity Limitations Stairs;Squat;Bend;Stand;Transfers;Lift    Examination-Participation Restrictions Church    Stability/Clinical Decision Making Evolving/Moderate complexity    Rehab Potential Fair    PT Frequency 2x / week    PT Duration 12 weeks    PT Treatment/Interventions ADLs/Self Care Home Management;Canalith Repostioning;Moist Heat;Traction;Electrical Stimulation;Gait training;Stair training;Functional mobility training;Therapeutic activities;Therapeutic exercise;Balance training;Neuromuscular re-education;Patient/family education;Passive range of motion;Energy conservation    PT Next Visit Plan Initiate strengthening and balance exercises, progress cardiovascular endurance training; Gilberto Better    PT Home Exercise Plan see above    Consulted and Agree with Plan of Care Patient;Family member/caregiver    Family Member Consulted daughter           Patient will benefit from skilled therapeutic intervention in order to improve the following deficits and impairments:  Abnormal gait,Decreased endurance,Cardiopulmonary status limiting activity,Decreased activity tolerance,Decreased strength,Decreased balance,Decreased mobility,Difficulty walking,Improper body mechanics,Decreased safety  awareness  Visit Diagnosis: Other lack of coordination  Muscle weakness (generalized)  Unsteadiness on feet  Gait abnormality  Aphasia  TIA (transient ischemic attack)  Repeated falls     Problem List Patient Active Problem List   Diagnosis Date Noted  . TIA (transient ischemic attack) 02/03/2020  . Aphasia   . Acute bronchitis 10/06/2016    Colin Mulders PT, DPT  09/08/2020, 5:23 PM  [place] [facility] MAIN [facility] SERVICES [address] [place], [place], [address] Phone: [phone]   Fax:  [phone]  Name: Kara Pennington MRN: [id number] Date of Birth: [birth year]  .VOR1

## 2020-09-09 NOTE — Therapy (Signed)
[place] MAIN [facility] SERVICES [address] [place], [place], [address] Phone: [phone]   Fax:  [phone]  Speech Language Pathology Treatment DISCHARGE SUMMARY  Patient Details  Name: Kara Pennington MRN: [id number] Date of Birth: 1941-04-05 Referring Provider (SLP): Doran Clay   Encounter Date: 09/08/2020   End of Session - 09/09/20 1108    Visit Number 6    Number of Visits 25    Authorization Type Medicare    Authorization - Visit Number 6    Progress Note Due on Visit 10    SLP Start Time 1500    SLP Stop Time  1600    SLP Time Calculation (min) 60 min    Activity Tolerance Patient tolerated treatment well           Past Medical History:  Diagnosis Date  . Anxiety   . Depression   . GERD (gastroesophageal reflux disease)   . Hypertension     Past Surgical History:  Procedure Laterality Date  . APPENDECTOMY    . PARATHYROIDECTOMY      There were no vitals filed for this visit.   Subjective Assessment - 09/09/20 1107    Subjective pt pleasant, states that she will "miss coming to speech"    Patient is accompained by: Family member    Currently in Pain? No/denies            Neuro   ST   TX   NOTE          Treatment Data and Patient's Response to Treatment   Skilled treatment session focused on pt's use of word finding strategies during complex/abstract communication. To facilitate this, SLP engaged pt in complex vocabulary activity as well as game (Would You Rather?). Over the course of these activities, pt was free of any s/s of aphasia. She and her daughter report that that pt is having fewer instances of word finding difficulty and beyond that, she freely describes strategies to she is independently uses to help with word finding. Additionally, pt has completed homework with advanced skill.   DISCHARGE SUMMARY Pt has consistently demonstrated an eagerness to progress towards her language goals. As such,  she has met her goals and she is independent with use of word finding strategies when conveying complex/abstract information. Pt is appropriate for discharge and will of pt and her daughter's questions answered to their satisfaction.         SLP Education - 09/09/20 1108    Education Details activities that are useful for continuing use of word finding strategies    Person(s) Educated Patient;Child(ren)    Methods Explanation;Demonstration;Verbal cues    Comprehension Verbalized understanding;Returned demonstration              SLP Long Term Goals - 09/09/20 1109      SLP LONG TERM GOAL #1   Title With modified indepedence pt will utilize word finding strategies to communicate complex thoughts related to daily activities.    Status Achieved            Plan - 09/09/20 1109    Treatment/Interventions Patient/family education;Compensatory techniques;Language facilitation;SLP instruction and feedback;Functional tasks    Potential to Achieve Goals Good    SLP Home Exercise Plan provided; see pt instructions    Consulted and Agree with Plan of Care Patient;Family member/caregiver    Family Member Consulted pt's daughter           Patient will benefit from skilled therapeutic intervention  in order to improve the following deficits and impairments:   Aphasia  TIA (transient ischemic attack)    Problem List Patient Active Problem List   Diagnosis Date Noted  . TIA (transient ischemic attack) 02/03/2020  . Aphasia   . Acute bronchitis 10/06/2016   Arayah Krouse B. Rutherford Nail M.S., CCC-SLP, [place] Pathologist Rehabilitation Services Office [phone]  Stormy Fabian 09/09/2020, 11:10 AM  [place] MAIN [facility] SERVICES [address] [place], [place], [address] Phone: [phone]   Fax:  [phone]   Name: SHAUNIECE KWAN MRN: [id number] Date of Birth: [birth year]

## 2020-09-09 NOTE — Patient Instructions (Signed)
Continue participating in various social interaction to continue pt's word finding abilities

## 2020-09-13 ENCOUNTER — Ambulatory Visit: Payer: Medicare Other | Admitting: Speech Pathology

## 2020-09-13 ENCOUNTER — Ambulatory Visit: Payer: Medicare Other

## 2020-09-13 ENCOUNTER — Other Ambulatory Visit: Payer: Self-pay

## 2020-09-13 DIAGNOSIS — R4701 Aphasia: Secondary | ICD-10-CM | POA: Diagnosis not present

## 2020-09-13 DIAGNOSIS — M6281 Muscle weakness (generalized): Secondary | ICD-10-CM

## 2020-09-13 DIAGNOSIS — R278 Other lack of coordination: Secondary | ICD-10-CM

## 2020-09-13 DIAGNOSIS — R269 Unspecified abnormalities of gait and mobility: Secondary | ICD-10-CM

## 2020-09-13 DIAGNOSIS — R2681 Unsteadiness on feet: Secondary | ICD-10-CM

## 2020-09-13 NOTE — Therapy (Signed)
[place] [facility] MAIN [facility] SERVICES [address] [place], [place], [address] Phone: [phone]   Fax:  [phone]  Physical Therapy Treatment  Patient Details  Name: Kara Pennington MRN: [id number] Date of Birth: [birth year] Referring Provider (PT): Dr. Lanny Cramp   Encounter Date: 09/13/2020   PT End of Session - 09/13/20 1721    Visit Number 7    Number of Visits 25    Date for PT Re-Evaluation 11/08/20    Authorization Type eval: 02/08    PT Start Time 1604    PT Stop Time 1655    PT Time Calculation (min) 51 min    Equipment Utilized During Treatment Gait belt    Activity Tolerance Patient tolerated treatment well;Patient limited by fatigue    Behavior During Therapy [facility] for tasks assessed/performed;Anxious           Past Medical History:  Diagnosis Date  . Anxiety   . Depression   . GERD (gastroesophageal reflux disease)   . Hypertension     Past Surgical History:  Procedure Laterality Date  . APPENDECTOMY    . PARATHYROIDECTOMY      There were no vitals filed for this visit.   Subjective Assessment - 09/13/20 1719    Subjective Patient reports she is on cancellation list to obtain an appointment with the Neurologist. She reports only 3 episodes of dizziness after last visit. Patient reports no dizzy symptoms today and agreeable to treatment today.    Patient is accompained by: Family member    Pertinent History 80 y.o. female presents to skilled physical therapy with a history of COVID-19 infection 1 year ago, hypothyroidism, and fall on 07/15/2020 with resultant nondisplaced right temporal bone fracture with associated minimal left parietal subarachnoid hemorrhage and a resolved right frontotemporal subdural hematoma who presented to [facility] hospital on 07/23/20 with intermitten but worsening word finding difficulties and expressive aphasia. While in the hospital, she had approximately 24 hours of speaking "in gibberish" and  unable to speak. After about 1 day, it was much improved and she was transferred to [facility] and Rehab. She normally lives in an independent living apartment. During the week, daughter stated that she was overall doing better though she still would mix up words. On the day of admit, the daughter spoke to her at around noon and she said they had a "normal" conversation. She arrived at [facility] to visit with her at 2:15 and immediately felt that her mother was not well. She was back to speaking non-sensically. Daughter requested return to [facility]. Upon arrival she was able to follow commands and answer some questions accurately, however, she was having considerable word finding difficulties and seemed somewhat frustrated. 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    Limitations Standing;Walking    How long can you sit comfortably? unlimited time    How long can you stand comfortably? less than 5 mins    How long can you walk comfortably? less than 5 mins    Patient Stated Goals "I want to be able to get back to church and rejoin the knitting club"    Currently in Pain? No/denies             Vitals at rest:  BP= 124/49 mmHg Left UE sitting HR= 53 bpm O2 sat=98% on room air Resp= 18  Nustep L0 UE/LE seat 6 -Instructed in maintaining > 50 SPM and patient performed 5 min. RPE= 5/10  Instructed in use of Modified BORG RPE Scale and issued handout so patient could review and apply. She was able to verbalize understanding and demo correct use with exercises and walking today.   Patient ambulated around 80 feet and then another 90 feet with 4WW, CGA exhibiting short reciprocal steps with forward lean- Requiring VCs to stay within the walker. Patient stopped both trials due to fatigue/weakness - rated each walk at a 5/10.   Instructed in seated LE strengthening  Today;  Seated hip march Seated hip abd Seated knee ext Seated toe  raises Seated Heel raises Sit to stand x 5 reps. All other above therex x 10 reps with cues to count out loud and for slow controlled movement.  Pt educated throughout session about proper posture and technique with exercises. Improved exercise technique, movement at target joints, use of target muscles after min to mod verbal, visual, tactile cues.  Issued Handout for seated LE strengthening mentioned above and instrcuted patient in goal of 3sets of 10 reps daily x 5x/week. She and her daughter verbalized understanding.                          PT Education - 09/13/20 1721    Education Details Education in use of Modified BORG and specific exercise technique    Person(s) Educated Patient    Methods Explanation;Demonstration;Tactile cues;Verbal cues    Comprehension Verbalized understanding;Verbal cues required;Need further instruction;Tactile cues required;Returned demonstration            PT Short Term Goals - 08/16/20 1645      PT SHORT TERM GOAL #1   Title Patient will be independent in home exercise program to improve strength/mobility for better functional independence with ADLs.    Time 6    Period Weeks    Status New    Target Date 09/27/20             PT Long Term Goals - 08/16/20 1649      PT LONG TERM GOAL #1   Title Patient will increase FOTO score to equal to or greater than to 61 demonstrate statistically significant improvement in mobility and quality of life.    Baseline 02/08: 45    Time 12    Period Weeks    Status New    Target Date 11/08/20      PT LONG TERM GOAL #2   Title Patient (> 61 years old) will complete five times sit to stand test in < 15 seconds indicating an increased LE strength and improved balance.    Baseline 02/08: 19s without BUE support    Time 12    Period Weeks    Status New    Target Date 11/08/20      PT LONG TERM GOAL #3   Title Patient will increase 10 meter walk test to >1.58m/s as to improve gait  speed for better community ambulation and to reduce fall risk.    Baseline 02/08:0.37 m/s    Time 12    Period Weeks    Status New    Target Date 11/08/20      PT LONG TERM GOAL #4   Title Patient will reduce timed up and go to <11 seconds to reduce fall risk and demonstrate improved transfer/gait ability.    Baseline 02/08: 33.43 with 2WW    Time 12    Period Weeks    Status New    Target Date 11/08/20  PT LONG TERM GOAL #5   Title Patient will increase ABC scale score >80% to demonstrate better functional mobility and better confidence with ADLs.    Baseline 02/08: 48.25%    Time 12    Period Weeks    Status New    Target Date 11/08/20                 Plan - 09/13/20 1722    Clinical Impression Statement Patient responded well to instruction in seated LE Strengthening and use of BORG scale. She was able to utilize the scale effectively today with all activities. She experienced no dizziness throughout session today. The patient continues to benefit from additional skilled PT services to improve symptoms, progress LE strengthening, gait and balance for improved quality of life.    Personal Factors and Comorbidities Age;Comorbidity 3+    Comorbidities TIA, aphasia, acute bronchitis    Examination-Activity Limitations Stairs;Squat;Bend;Stand;Transfers;Lift    Examination-Participation Restrictions Church    Stability/Clinical Decision Making Evolving/Moderate complexity    Rehab Potential Fair    PT Frequency 2x / week    PT Duration 12 weeks    PT Treatment/Interventions ADLs/Self Care Home Management;Canalith Repostioning;Moist Heat;Traction;Electrical Stimulation;Gait training;Stair training;Functional mobility training;Therapeutic activities;Therapeutic exercise;Balance training;Neuromuscular re-education;Patient/family education;Passive range of motion;Energy conservation    PT Next Visit Plan Review and progress strengthening and balance exercises, progress  cardiovascular endurance training    PT Home Exercise Plan Issued seated HEP For strengthening today.    Consulted and Agree with Plan of Care Patient;Family member/caregiver    Family Member Consulted daughter           Patient will benefit from skilled therapeutic intervention in order to improve the following deficits and impairments:  Abnormal gait,Decreased endurance,Cardiopulmonary status limiting activity,Decreased activity tolerance,Decreased strength,Decreased balance,Decreased mobility,Difficulty walking,Improper body mechanics,Decreased safety awareness  Visit Diagnosis: Other lack of coordination  Muscle weakness (generalized)  Unsteadiness on feet  Gait abnormality     Problem List Patient Active Problem List   Diagnosis Date Noted  . TIA (transient ischemic attack) 02/03/2020  . Aphasia   . Acute bronchitis 10/06/2016    Lenda Kelp , PT 09/13/2020, 5:25 PM  [place] [facility] MAIN [facility] SERVICES [address] [place], [place], [address] Phone: [phone]   Fax:  [phone]  Name: Kara Pennington MRN: [id number] Date of Birth: 1941/02/05

## 2020-09-19 ENCOUNTER — Encounter: Payer: Self-pay | Admitting: Occupational Therapy

## 2020-09-19 ENCOUNTER — Other Ambulatory Visit: Payer: Self-pay

## 2020-09-19 ENCOUNTER — Ambulatory Visit: Payer: Medicare Other | Admitting: Occupational Therapy

## 2020-09-19 ENCOUNTER — Ambulatory Visit: Payer: Medicare Other

## 2020-09-19 DIAGNOSIS — R2681 Unsteadiness on feet: Secondary | ICD-10-CM

## 2020-09-19 DIAGNOSIS — R278 Other lack of coordination: Secondary | ICD-10-CM

## 2020-09-19 DIAGNOSIS — R269 Unspecified abnormalities of gait and mobility: Secondary | ICD-10-CM

## 2020-09-19 DIAGNOSIS — M6281 Muscle weakness (generalized): Secondary | ICD-10-CM

## 2020-09-19 DIAGNOSIS — R4701 Aphasia: Secondary | ICD-10-CM | POA: Diagnosis not present

## 2020-09-19 NOTE — Therapy (Signed)
[facility] MAIN [facility] SERVICES [address] [place], [place], [address] Phone: [phone]   Fax:  [phone]  Physical Therapy Treatment  Patient Details  Name: Kara Pennington MRN: [id number] Date of Birth: 1940/08/19 Referring Provider (PT): Dr. Lanny Cramp   Encounter Date: 09/19/2020   PT End of Session - 09/19/20 1527    Visit Number 8    Number of Visits 25    Date for PT Re-Evaluation 11/08/20    Authorization Type eval: 02/08    PT Start Time 1515    PT Stop Time 1559    PT Time Calculation (min) 44 min    Equipment Utilized During Treatment Gait belt    Activity Tolerance Patient tolerated treatment well;Patient limited by fatigue    Behavior During Therapy [facility] for tasks assessed/performed;Anxious           Past Medical History:  Diagnosis Date  . Anxiety   . Depression   . GERD (gastroesophageal reflux disease)   . Hypertension     Past Surgical History:  Procedure Laterality Date  . APPENDECTOMY    . PARATHYROIDECTOMY      There were no vitals filed for this visit.   Subjective Assessment - 09/19/20 1525    Subjective Patient reports she is not having any pain but continues to complain of overwhelming fatigue.    Patient is accompained by: Family member    Pertinent History 80 y.o. female presents to skilled physical therapy with a history of COVID-19 infection 1 year ago, hypothyroidism, and fall on 07/15/2020 with resultant nondisplaced right temporal bone fracture with associated minimal left parietal subarachnoid hemorrhage and a resolved right frontotemporal subdural hematoma who presented to [facility] on 07/23/20 with intermitten but worsening word finding difficulties and expressive aphasia. While in the hospital, she had approximately 24 hours of speaking "in gibberish" and unable to speak. After about 1 day, it was much improved and she was transferred to [facility]. She normally  lives in an independent living apartment. During the week, daughter stated that she was overall doing better though she still would mix up words. On the day of admit, the daughter spoke to her at around noon and she said they had a "normal" conversation. She arrived at [facility] to visit with her at 2:15 and immediately felt that her mother was not well. She was back to speaking non-sensically. Daughter requested return to [facility]. Upon arrival she was able to follow commands and answer some questions accurately, however, she was having considerable word finding difficulties and seemed somewhat frustrated. 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    Limitations Standing;Walking    How long can you sit comfortably? unlimited time    How long can you stand comfortably? less than 5 mins    How long can you walk comfortably? less than 5 mins    Patient Stated Goals "I want to be able to get back to church and rejoin the knitting club"             Vitals after Nustep  BP= 13968 mmHg Left UE sitting HR= 538 bpm O2 sat=98% on room air Resp= 22  Nustep L1 UE/LE seat 6 -Instructed in maintaining > 50 SPM and patient performed 5 min. RPE= 5/10 - (unbilled)   Reviewed seated therex from previous visit verbally as well as the Modified BORG RPE Scale.She was able to verbalize understanding Patient ambulated 160  feet with 4WW, CGA exhibiting short reciprocal steps with forward lean- Requiring VCs to stay within the walker. Patient reported fatigue as limiting factor - rated walk at a 5/10 on modified Borg RPE.   Instructed in standing  LE strengthening  Today;   hip march- 7 reps BLE- stopped secondary to reporting 5/10 on RPE  hip abd- 10 reps BLE- rated at a 5/10  toe raises- 10 reps BLE  Heel raises 10 reps BLE - rated at a 4/10 Minisquats- 10 reps - rated at a 4/10 Step ups with BUE support using purple pad - 4/10  Pt educated throughout session  about proper posture and technique with exercises. Improved exercise technique, movement at target joints, use of target muscles after min to mod verbal, visual, tactile cues.                                  PT Education - 09/19/20 1718    Education Details Review of seated therex verbally, BORG scale, and standing therex technique    Person(s) Educated Patient    Methods Explanation;Demonstration;Tactile cues;Verbal cues    Comprehension Verbalized understanding;Verbal cues required;Need further instruction;Returned demonstration;Tactile cues required            PT Short Term Goals - 08/16/20 1645      PT SHORT TERM GOAL #1   Title Patient will be independent in home exercise program to improve strength/mobility for better functional independence with ADLs.    Time 6    Period Weeks    Status New    Target Date 09/27/20             PT Long Term Goals - 08/16/20 1649      PT LONG TERM GOAL #1   Title Patient will increase FOTO score to equal to or greater than to 61 demonstrate statistically significant improvement in mobility and quality of life.    Baseline 02/08: 45    Time 12    Period Weeks    Status New    Target Date 11/08/20      PT LONG TERM GOAL #2   Title Patient (> 66 years old) will complete five times sit to stand test in < 15 seconds indicating an increased LE strength and improved balance.    Baseline 02/08: 19s without BUE support    Time 12    Period Weeks    Status New    Target Date 11/08/20      PT LONG TERM GOAL #3   Title Patient will increase 10 meter walk test to >1.25m/s as to improve gait speed for better community ambulation and to reduce fall risk.    Baseline 02/08:0.37 m/s    Time 12    Period Weeks    Status New    Target Date 11/08/20      PT LONG TERM GOAL #4   Title Patient will reduce timed up and go to <11 seconds to reduce fall risk and demonstrate improved transfer/gait ability.    Baseline  02/08: 33.43 with 2WW    Time 12    Period Weeks    Status New    Target Date 11/08/20      PT LONG TERM GOAL #5   Title Patient will increase ABC scale score >80% to demonstrate better functional mobility and better confidence with ADLs.    Baseline 02/08: 48.25%    Time 12  Period Weeks    Status New    Target Date 11/08/20                 Plan - 09/19/20 1556    Clinical Impression Statement Patient demonstrated improved functional endurance as seen by increased gait distance prior to rest break and was able to progress to standing therex today. She presents with good understanding of use of BORG scale and able to utilize during session appropriately. The patient continues to benefit from additional skilled PT services to improve symptoms, progress LE strengthening, gait and balance for improved quality of life.    Personal Factors and Comorbidities Age;Comorbidity 3+    Comorbidities TIA, aphasia, acute bronchitis    Examination-Activity Limitations Stairs;Squat;Bend;Stand;Transfers;Lift    Examination-Participation Restrictions [employer]    Stability/Clinical Decision Making Evolving/Moderate complexity    Rehab Potential Fair    PT Frequency 2x / week    PT Duration 12 weeks    PT Treatment/Interventions ADLs/Self Care Home Management;Canalith Repostioning;Moist Heat;Traction;Electrical Stimulation;Gait training;Stair training;Functional mobility training;Therapeutic activities;Therapeutic exercise;Balance training;Neuromuscular re-education;Patient/family education;Passive range of motion;Energy conservation    PT Next Visit Plan Review and progress strengthening and balance exercises, progress cardiovascular endurance training    PT Home Exercise Plan Issued seated HEP For strengthening today.    Consulted and Agree with Plan of Care Patient;Family member/caregiver    Family Member Consulted daughter           Patient will benefit from skilled therapeutic  intervention in order to improve the following deficits and impairments:  Abnormal gait,Decreased endurance,Cardiopulmonary status limiting activity,Decreased activity tolerance,Decreased strength,Decreased balance,Decreased mobility,Difficulty walking,Improper body mechanics,Decreased safety awareness  Visit Diagnosis: Other lack of coordination  Muscle weakness (generalized)  Unsteadiness on feet  Gait abnormality     Problem List Patient Active Problem List   Diagnosis Date Noted  . TIA (transient ischemic attack) 02/03/2020  . Aphasia   . Acute bronchitis 10/06/2016    Lenda Kelp, PT 09/19/2020, 5:22 PM  [place] [facility] MAIN [facility] SERVICES [address] [place], [place], [address] Phone: [phone]   Fax:  [phone]  Name: Kara Pennington MRN: [id number] Date of Birth: Jun 20, 1941

## 2020-09-20 NOTE — Therapy (Signed)
[facility] MAIN [facility] SERVICES [address] [place], [place], [address] Phone: [phone]   Fax:  [phone]  Occupational Therapy Treatment  Patient Details  Name: Kara Pennington MRN: [id number] Date of Birth: [birth year] No data recorded  Encounter Date: 09/19/2020   OT End of Session - 09/20/20 2100    Visit Number 4    Number of Visits 24    Date for OT Re-Evaluation 11/11/20    OT Start Time 1600    OT Stop Time 1644    OT Time Calculation (min) 44 min    Activity Tolerance Patient tolerated treatment well    Behavior During Therapy [facility] for tasks assessed/performed;Anxious           Past Medical History:  Diagnosis Date  . Anxiety   . Depression   . GERD (gastroesophageal reflux disease)   . Hypertension     Past Surgical History:  Procedure Laterality Date  . APPENDECTOMY    . PARATHYROIDECTOMY      There were no vitals filed for this visit.   Subjective Assessment - 09/20/20 2058    Subjective  Pt reports she hasn't been walking or pushing wheelchair to get to the dining room at ALF.  Her daughters have been pushing her currently.  "I really need to be able to get to the dining room on my own."    Pertinent History Pt is an 80 year old female with a history of COVID-19 infection 1 year ago, hypothyroidism, and recent fall downstairs on 07/15/2020. Pt with resultant Trace L parietal SAH, Trace R parietal, L frontal SAH and R frontoparietal SDH. Pt discharged from Inpatient rehab on Thursday August 11, 2020.    Patient Stated Goals Pt would like to get back to driving, be as independent as she was before.    Currently in Pain? No/denies    Multiple Pain Sites No            Discussed structure of activities at home, her daughters have still been taking turns to stay with her at the ALF.  She has not been pushing herself in the wheelchair or walking to the dining area which is a long distance from her room.  Recommended  she work on navigating some of the distances to work towards building her strength and endurance to perform this task.  Discussed potentially navigating her wheelchair for part of the distance and have her daughters push her for parts.  Or consider either getting up and pushing the wheelchair or using her rollator.  Will discuss with PT to work towards this goal further.    Patient seen for UE strengthening tasks with use of 3# dowel for shoulder flexion, ABD, ADD, chest press, forwards and backwards circles 2 sets of 10 reps each.  Attempted dumbbells, difficulty with 2# in each hand, therapist recommended 1# weights.  Discussed where to purchase locally or ordering online.  Patient able to demonstrate all the same above movements with dumbbells as she did with the weighted dowel.  Pt requires rest breaks during exercises.    Response to tx: Pt continues to progress, she has one daughter who needs to go home for surgery, another daughter will come to stay.  When discussing tasks which are keeping her daughters with her include navigating to the dining room which is a long distance for her.  She will work on strategies to improve this area for greater independence.  Pt planning to purchase recommended weights for  use in home program.  Continue to work towards goals in plan of care to increase independence in necessary daily tasks.                      OT Education - 09/20/20 2059    Education Details HEP    Person(s) Educated Patient;Child(ren)    Methods Explanation;Demonstration    Comprehension Verbalized understanding;Returned demonstration               OT Long Term Goals - 08/21/20 1951      OT LONG TERM GOAL #1   Title Pt will perform home exercise program with modified independence.    Baseline eval: no current program    Time 12    Period Weeks    Status New    Target Date 11/11/20      OT LONG TERM GOAL #2   Title Pt will complete dressing skills with  modified independence with no rest breaks and in 20 mins or less.    Baseline eval:  increased time allowed, distant supervision    Time 6    Period Weeks    Status New    Target Date 09/30/20      OT LONG TERM GOAL #3   Title Pt will complete light homemaking skills with modified independence.    Baseline Eval:  daughter helping currently    Time 72    Period Weeks    Status New    Target Date 11/11/20      OT LONG TERM GOAL #4   Title Pt will complete laundry with modified independence including tranporting items to and from laundry room.    Baseline Eval: Daughter performing    Time 40    Period Weeks    Status New    Target Date 11/11/20      OT LONG TERM GOAL #5   Title Pt will improve grip strength bilaterally by 10# to assist with opening jars and containers.    Baseline Eval:  decreased grip    Time 6    Period Weeks    Status New    Target Date 09/30/20      Long Term Additional Goals   Additional Long Term Goals Yes      OT LONG TERM GOAL #6   Title Pt will improve ROM and strength to perform hair styling with modified independence    Baseline Eval:  Requires assist    Time 6    Period Weeks    Target Date 09/30/20      OT LONG TERM GOAL #7   Title Pt will obtain clothing from closet and drawers in preparation for self care/dressing tasks.    Baseline Eval:  Dtr performing    Time 6    Period Weeks    Status New    Target Date 09/30/20                 Plan - 09/20/20 2100    Clinical Impression Statement Pt continues to progress, she has one daughter who needs to go home for surgery, another daughter will come to stay.  When discussing tasks which are keeping her daughters with her include navigating to the dining room which is a long distance for her.  She will work on strategies to improve this area for greater independence.  Pt planning to purchase recommended weights for use in home program.  Continue to work towards goals in plan of care  to  increase independence in necessary daily tasks.    OT Occupational Profile and History Detailed Assessment- Review of Records and additional review of physical, cognitive, psychosocial history related to current functional performance    Occupational performance deficits (Please refer to evaluation for details): ADL's;IADL's;Leisure;Social Participation    Body Structure / Function / Physical Skills ADL;Dexterity;ROM;Strength;Balance;Coordination;FMC;IADL;UE functional use;GMC;Mobility    Engineer, materials Environmental  Adaptations;Habits;Routines and Behaviors    Rehab Potential Good    Clinical Decision Making Several treatment options, min-mod task modification necessary    Comorbidities Affecting Occupational Performance: May have comorbidities impacting occupational performance    Modification or Assistance to Complete Evaluation  No modification of tasks or assist necessary to complete eval    OT Frequency 2x / week    OT Duration 12 weeks    OT Treatment/Interventions Self-care/ADL training;Cryotherapy;Therapeutic exercise;DME and/or AE instruction;Functional Mobility Training;Cognitive remediation/compensation;Balance training;Neuromuscular education;Manual Therapy;Moist Heat;Contrast Bath;Passive range of motion;Therapeutic activities;Patient/family education    Consulted and Agree with Plan of Care Patient           Patient will benefit from skilled therapeutic intervention in order to improve the following deficits and impairments:   Body Structure / Function / Physical Skills: ADL,Dexterity,ROM,Strength,Balance,Coordination,FMC,IADL,UE functional use,GMC,Mobility Cognitive Skills: Memory Psychosocial Skills: Environmental  Adaptations,Habits,Routines and Behaviors   Visit Diagnosis: Muscle weakness (generalized)  Other lack of coordination    Problem List Patient Active Problem List   Diagnosis Date Noted  . TIA (transient ischemic attack)  02/03/2020  . Aphasia   . Acute bronchitis 10/06/2016   Zymere Patlan T Arne Cleveland, OTR/L, CLT Royer Cristobal 09/21/2020, 7:36 PM  [facility] MAIN [facility] SERVICES [address] [place], [place], [address] Phone: [phone]   Fax:  [phone]  Name: SHAWNESE MAGNER MRN: [id number] Date of Birth: April 14, 1941

## 2020-09-21 ENCOUNTER — Other Ambulatory Visit: Payer: Self-pay

## 2020-09-21 ENCOUNTER — Ambulatory Visit: Payer: Medicare Other

## 2020-09-21 ENCOUNTER — Encounter: Payer: Self-pay | Admitting: Occupational Therapy

## 2020-09-21 ENCOUNTER — Encounter: Payer: Medicare Other | Admitting: Speech Pathology

## 2020-09-21 ENCOUNTER — Ambulatory Visit: Payer: Medicare Other | Admitting: Occupational Therapy

## 2020-09-21 DIAGNOSIS — R269 Unspecified abnormalities of gait and mobility: Secondary | ICD-10-CM

## 2020-09-21 DIAGNOSIS — R4701 Aphasia: Secondary | ICD-10-CM | POA: Diagnosis not present

## 2020-09-21 DIAGNOSIS — R296 Repeated falls: Secondary | ICD-10-CM

## 2020-09-21 DIAGNOSIS — R278 Other lack of coordination: Secondary | ICD-10-CM

## 2020-09-21 DIAGNOSIS — M6281 Muscle weakness (generalized): Secondary | ICD-10-CM

## 2020-09-21 DIAGNOSIS — R2681 Unsteadiness on feet: Secondary | ICD-10-CM

## 2020-09-21 NOTE — Therapy (Signed)
[facility] MAIN [facility] SERVICES [address] [place], [place], [address] Phone: [phone]   Fax:  [phone]  Occupational Therapy Treatment  Patient Details  Name: Kara Pennington MRN: [id number] Date of Birth: 01/01/1941 No data recorded  Encounter Date: 09/21/2020   OT End of Session - 09/21/20 1817    Visit Number 5    Number of Visits 24    Date for OT Re-Evaluation 11/11/20    OT Start Time 1650    OT Stop Time 1738    OT Time Calculation (min) 48 min    Activity Tolerance Patient tolerated treatment well    Behavior During Therapy [facility] for tasks assessed/performed           Past Medical History:  Diagnosis Date  . Anxiety   . Depression   . GERD (gastroesophageal reflux disease)   . Hypertension     Past Surgical History:  Procedure Laterality Date  . APPENDECTOMY    . PARATHYROIDECTOMY      There were no vitals filed for this visit.   Subjective Assessment - 09/21/20 1817    Subjective  Pt. reports that she went to [employer] today.    Pertinent History Pt is an 80 year old female with a history of COVID-19 infection 1 year ago, hypothyroidism, and recent fall downstairs on 07/15/2020. Pt with resultant Trace L parietal SAH, Trace R parietal, L frontal SAH and R frontoparietal SDH. Pt discharged from Inpatient rehab on Thursday August 11, 2020.    Currently in Pain? No/denies          OT Treatment  Pt. Education was provided about A/E use for LE ADLs. Pt. Required Supervision for donning socks with a sockaide, doffing socks with a dressing stick, and reacher. Education was provided about Long handled shoehorns, and Long handled sponges. Pt. Was provided with, and set-up with elastic shoelaces. Pt. was able to donn her shoes with the elastic laces in place. The laces my need to be adjusted as needed to ensure proper fit, and comfort.   Pt. Reports that she has a follow-up appointment with the pulmonologist soon. Pt.  Presents with limited activity tolerance. Pt.'s daughter had questions about attachments for adhering the handheld shower head to a lower position on the wall in her shower stall for easier access. Pt.'s daughter plans to bring in a photo of the shower stall at the pt.'s next visit. Pt. Continues to work on improving UE strength, and activity tolerance in order to improve overall functioning during ADLs, and IADL tasks.                        OT Education - 09/21/20 1817    Education Details A/E    Person(s) Educated Patient;Child(ren)    Methods Explanation;Demonstration    Comprehension Verbalized understanding;Returned demonstration               OT Long Term Goals - 08/21/20 1951      OT LONG TERM GOAL #1   Title Pt will perform home exercise program with modified independence.    Baseline eval: no current program    Time 12    Period Weeks    Status New    Target Date 11/11/20      OT LONG TERM GOAL #2   Title Pt will complete dressing skills with modified independence with no rest breaks and in 20 mins or less.    Baseline eval:  increased time allowed, distant supervision    Time 6    Period Weeks    Status New    Target Date 09/30/20      OT LONG TERM GOAL #3   Title Pt will complete light homemaking skills with modified independence.    Baseline Eval:  daughter helping currently    Time 35    Period Weeks    Status New    Target Date 11/11/20      OT LONG TERM GOAL #4   Title Pt will complete laundry with modified independence including tranporting items to and from laundry room.    Baseline Eval: Daughter performing    Time 42    Period Weeks    Status New    Target Date 11/11/20      OT LONG TERM GOAL #5   Title Pt will improve grip strength bilaterally by 10# to assist with opening jars and containers.    Baseline Eval:  decreased grip    Time 6    Period Weeks    Status New    Target Date 09/30/20      Long Term Additional  Goals   Additional Long Term Goals Yes      OT LONG TERM GOAL #6   Title Pt will improve ROM and strength to perform hair styling with modified independence    Baseline Eval:  Requires assist    Time 6    Period Weeks    Target Date 09/30/20      OT LONG TERM GOAL #7   Title Pt will obtain clothing from closet and drawers in preparation for self care/dressing tasks.    Baseline Eval:  Dtr performing    Time 6    Period Weeks    Status New    Target Date 09/30/20                 Plan - 09/21/20 1818    Clinical Impression Statement Pt. Reports that she has a follow-up appointment with the pulmonologist soon. Pt. Presents with limited activity tolerance. Pt.'s daughter had questions about attachments for adhering the handheld shower head to a lower position on the wall in her shower stall for easier access. Pt.'s daughter plans to bring in a photo of the shower stall at the pt.'s next visit. Pt. Continues to work on improving UE strength, and activity tolerance in order to improve overall functioning during ADLs, and IADL tasks.   OT Occupational Profile and History Detailed Assessment- Review of Records and additional review of physical, cognitive, psychosocial history related to current functional performance    Occupational performance deficits (Please refer to evaluation for details): ADL's;IADL's;Leisure;Social Participation    Body Structure / Function / Physical Skills ADL;Dexterity;ROM;Strength;Balance;Coordination;FMC;IADL;UE functional use;GMC;Mobility    Engineer, materials Environmental  Adaptations;Habits;Routines and Behaviors    Rehab Potential Good    Clinical Decision Making Several treatment options, min-mod task modification necessary    Comorbidities Affecting Occupational Performance: May have comorbidities impacting occupational performance    Modification or Assistance to Complete Evaluation  No modification of tasks or assist  necessary to complete eval    OT Frequency 2x / week    OT Duration 12 weeks    OT Treatment/Interventions Self-care/ADL training;Cryotherapy;Therapeutic exercise;DME and/or AE instruction;Functional Mobility Training;Cognitive remediation/compensation;Balance training;Neuromuscular education;Manual Therapy;Moist Heat;Contrast Bath;Passive range of motion;Therapeutic activities;Patient/family education    Consulted and Agree with Plan of Care Patient  Patient will benefit from skilled therapeutic intervention in order to improve the following deficits and impairments:   Body Structure / Function / Physical Skills: ADL,Dexterity,ROM,Strength,Balance,Coordination,FMC,IADL,UE functional use,GMC,Mobility Cognitive Skills: Memory Psychosocial Skills: Environmental  Adaptations,Habits,Routines and Behaviors   Visit Diagnosis: Muscle weakness (generalized)    Problem List Patient Active Problem List   Diagnosis Date Noted  . TIA (transient ischemic attack) 02/03/2020  . Aphasia   . Acute bronchitis 10/06/2016    Olegario Messier, MS, OTR/L 09/21/2020, 6:19 PM  [facility] MAIN [facility] SERVICES [address] [place], [place], [address] Phone: [phone]   Fax:  [phone]  Name: Kara Pennington MRN: [id number] Date of Birth: Jun 20, 1941

## 2020-09-21 NOTE — Therapy (Signed)
[facility] MAIN [facility] SERVICES [address] [place], [place], [address] Phone: [phone]   Fax:  [phone]  Physical Therapy Treatment  Patient Details  Name: Kara Pennington MRN: [id number] Date of Birth: May 21, 1941 Referring Provider (PT): Dr. Lanny Cramp   Encounter Date: 09/21/2020   PT End of Session - 09/21/20 1655    Visit Number 9    Number of Visits 25    Date for PT Re-Evaluation 11/08/20    Authorization Type eval: 02/08    PT Start Time 1606    PT Stop Time 1645    PT Time Calculation (min) 39 min    Equipment Utilized During Treatment Gait belt    Activity Tolerance Patient tolerated treatment well;Patient limited by fatigue    Behavior During Therapy [facility] for tasks assessed/performed           Past Medical History:  Diagnosis Date  . Anxiety   . Depression   . GERD (gastroesophageal reflux disease)   . Hypertension     Past Surgical History:  Procedure Laterality Date  . APPENDECTOMY    . PARATHYROIDECTOMY      There were no vitals filed for this visit.   Subjective Assessment - 09/21/20 1653    Subjective Patient reports running behind today due to issues with no Valet service available when pulled up to front and daughter assisted her into clinic. She states otherwise no new issues.    Patient is accompained by: Family member    Pertinent History 80 y.o. female presents to skilled physical therapy with a history of COVID-19 infection 1 year ago, hypothyroidism, and fall on 07/15/2020 with resultant nondisplaced right temporal bone fracture with associated minimal left parietal subarachnoid hemorrhage and a resolved right frontotemporal subdural hematoma who presented to [facility] hospital on 07/23/20 with intermitten but worsening word finding difficulties and expressive aphasia. While in the hospital, she had approximately 24 hours of speaking "in gibberish" and unable to speak. After about 1 day, it was much  improved and she was transferred to [facility]. She normally lives in an independent living apartment. During the week, daughter stated that she was overall doing better though she still would mix up words. On the day of admit, the daughter spoke to her at around noon and she said they had a "normal" conversation. She arrived at [facility] to visit with her at 2:15 and immediately felt that her mother was not well. She was back to speaking non-sensically. Daughter requested return to [facility]. Upon arrival she was able to follow commands and answer some questions accurately, however, she was having considerable word finding difficulties and seemed somewhat frustrated. 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    Limitations Standing;Walking    How long can you sit comfortably? unlimited time    How long can you stand comfortably? less than 5 mins    How long can you walk comfortably? less than 5 mins    Patient Stated Goals "I want to be able to get back to church and rejoin the knitting club"    Currently in Pain? No/denies             Nustep L2 LE seat 6 -Instructed in maintaining > 50 SPM and patient performed 6 min. RPE= 5/10 with total distance =0.17 mi - (unbilled)   Reviewed standing  LE strengthening    hip march- 10 reps BLE- rated at a 4/10  hip abd- 10 reps BLE- rated at a 5/10  Hip ext- 10 reps BLE  Toe raises- 10 reps BLE Knee flex - 10 reps BLE   Heel raises 10 reps BLE - rated at a 4/10 Minisquats- 10 reps - rated at a 4/10   Pt educated throughout session about proper posture and technique with exercises. Improved exercise technique, movement at target joints, use of target muscles after min to mod verbal, visual, tactile cues.   Issued HEP to patient and reviewed with patient and daughter.  [email or web address]  Access Code: [email or web address]                                   PT Education - 09/21/20 1655    Education Details Patient educated in standing therex for HEP and issued handout today.    Person(s) Educated Patient;Child(ren)    Methods Explanation;Tactile cues;Demonstration;Verbal cues;Handout    Comprehension Verbalized understanding;Verbal cues required;Need further instruction;Returned demonstration;Tactile cues required            PT Short Term Goals - 08/16/20 1645      PT SHORT TERM GOAL #1   Title Patient will be independent in home exercise program to improve strength/mobility for better functional independence with ADLs.    Time 6    Period Weeks    Status New    Target Date 09/27/20             PT Long Term Goals - 08/16/20 1649      PT LONG TERM GOAL #1   Title Patient will increase FOTO score to equal to or greater than to 61 demonstrate statistically significant improvement in mobility and quality of life.    Baseline 02/08: 45    Time 12    Period Weeks    Status New    Target Date 11/08/20      PT LONG TERM GOAL #2   Title Patient (> 4 years old) will complete five times sit to stand test in < 15 seconds indicating an increased LE strength and improved balance.    Baseline 02/08: 19s without BUE support    Time 12    Period Weeks    Status New    Target Date 11/08/20      PT LONG TERM GOAL #3   Title Patient will increase 10 meter walk test to >1.63m/s as to improve gait speed for better community ambulation and to reduce fall risk.    Baseline 02/08:0.37 m/s    Time 12    Period Weeks    Status New    Target Date 11/08/20      PT LONG TERM GOAL #4   Title Patient will reduce timed up and go to <11 seconds to reduce fall risk and demonstrate improved transfer/gait ability.    Baseline 02/08: 33.43 with 2WW    Time 12    Period Weeks    Status New    Target Date 11/08/20      PT LONG TERM GOAL #5   Title Patient will increase ABC scale score >80% to demonstrate better functional mobility and better  confidence with ADLs.    Baseline 02/08: 48.25%    Time 12    Period Weeks    Status New    Target Date 11/08/20                 Plan - 09/21/20 1657  Clinical Impression Statement Patient responded well to review of standing LE strengthening exercises without issues of pain- only fatigue today. Patient was educated in these exercises and added successfully to home program today with patient able to verbalize and complete exercises with VC and visual demonstration.The patient continues to benefit from additional skilled PT services to improve symptoms, progress LE strengthening, gait and balance for improved quality of life.    Personal Factors and Comorbidities Age;Comorbidity 3+    Comorbidities TIA, aphasia, acute bronchitis    Examination-Activity Limitations Stairs;Squat;Bend;Stand;Transfers;Lift    Examination-Participation Restrictions [employer]    Stability/Clinical Decision Making Evolving/Moderate complexity    Rehab Potential Fair    PT Frequency 2x / week    PT Duration 12 weeks    PT Treatment/Interventions ADLs/Self Care Home Management;Canalith Repostioning;Moist Heat;Traction;Electrical Stimulation;Gait training;Stair training;Functional mobility training;Therapeutic activities;Therapeutic exercise;Balance training;Neuromuscular re-education;Patient/family education;Passive range of motion;Energy conservation    PT Next Visit Plan Review and progress strengthening and balance exercises, progress cardiovascular endurance training    PT Home Exercise Plan Issued standing  HEP For strengthening today.    Consulted and Agree with Plan of Care Patient;Family member/caregiver    Family Member Consulted daughter           Patient will benefit from skilled therapeutic intervention in order to improve the following deficits and impairments:  Abnormal gait,Decreased endurance,Cardiopulmonary status limiting activity,Decreased activity tolerance,Decreased strength,Decreased  balance,Decreased mobility,Difficulty walking,Improper body mechanics,Decreased safety awareness  Visit Diagnosis: Muscle weakness (generalized)  Other lack of coordination  Unsteadiness on feet  Repeated falls  Gait abnormality     Problem List Patient Active Problem List   Diagnosis Date Noted  . TIA (transient ischemic attack) 02/03/2020  . Aphasia   . Acute bronchitis 10/06/2016    Lenda Kelp, PT 09/21/2020, 5:12 PM  [facility] MAIN [facility] SERVICES [address] [place], [place], [address] Phone: [phone]   Fax:  [phone]  Name: BARABARA MOTZ MRN: [id number] Date of Birth: 02-Jan-1941

## 2020-09-26 ENCOUNTER — Encounter: Payer: Self-pay | Admitting: Occupational Therapy

## 2020-09-26 ENCOUNTER — Ambulatory Visit: Payer: Medicare Other | Admitting: Occupational Therapy

## 2020-09-26 ENCOUNTER — Ambulatory Visit: Payer: Medicare Other

## 2020-09-26 ENCOUNTER — Encounter: Payer: Medicare Other | Admitting: Speech Pathology

## 2020-09-26 ENCOUNTER — Other Ambulatory Visit: Payer: Self-pay

## 2020-09-26 DIAGNOSIS — R296 Repeated falls: Secondary | ICD-10-CM

## 2020-09-26 DIAGNOSIS — M6281 Muscle weakness (generalized): Secondary | ICD-10-CM

## 2020-09-26 DIAGNOSIS — R4701 Aphasia: Secondary | ICD-10-CM | POA: Diagnosis not present

## 2020-09-26 DIAGNOSIS — R269 Unspecified abnormalities of gait and mobility: Secondary | ICD-10-CM

## 2020-09-26 DIAGNOSIS — R278 Other lack of coordination: Secondary | ICD-10-CM

## 2020-09-26 DIAGNOSIS — R2681 Unsteadiness on feet: Secondary | ICD-10-CM

## 2020-09-26 NOTE — Therapy (Signed)
[facility] MAIN [facility] SERVICES [address] [place], [place], [address] Phone: [phone]   Fax:  [phone]  Occupational Therapy Treatment  Patient Details  Name: Kara Pennington MRN: [id number] Date of Birth: Nov 11, 1940 No data recorded  Encounter Date: 09/26/2020   OT End of Session - 09/26/20 1526    Visit Number 6    Number of Visits 24    Date for OT Re-Evaluation 11/11/20    OT Start Time 1515    OT Stop Time 1600    OT Time Calculation (min) 45 min    Activity Tolerance Patient tolerated treatment well    Behavior During Therapy [facility] for tasks assessed/performed           Past Medical History:  Diagnosis Date  . Anxiety   . Depression   . GERD (gastroesophageal reflux disease)   . Hypertension     Past Surgical History:  Procedure Laterality Date  . APPENDECTOMY    . PARATHYROIDECTOMY      There were no vitals filed for this visit.   Subjective Assessment - 09/26/20 1520    Subjective  Pt reports she had her pulmonology appointment and it went well, she reports taking baby steps to improve. 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    Pertinent History Pt is an 80 year old female with a history of COVID-19 infection 1 year ago, hypothyroidism, and recent fall downstairs on 07/15/2020. Pt with resultant Trace L parietal SAH, Trace R parietal, L frontal SAH and R frontoparietal SDH. Pt discharged from Inpatient rehab on Thursday August 11, 2020.    Patient Stated Goals Pt would like to get back to driving, be as independent as she was before.    Currently in Pain? No/denies            SELF CARE Pt reports her 1# dumbbells should be arriving today, pt return demonstrated her BUE HEP with MIN cues for technique, recalled 5/8 exercises. Pt instructed on scheduling time daily for HEP, requires MIN cues to identify morning as best time. Pt deferred completing  therex citing fatigue plus having PT following this session. Pt instructed on energy conservation strategies including pursed lip breathing, positioning, activity pacing, home/routines modifications, AE/DME, prioritizing of meaningful occupations, and falls prevention. Handout provided. Pt return demonstrated breathing techniques including diaphragmatic breathing and 4-2-4 breathing. Pt reports she will push her w/c then sit in it when tired but becomes fatigued maneuvering around it - pt requests to trial. Pt required SBA for simulated w/c mgmt using chair with armrests. Pt requires BUE support t/o task on w/c and table. Cues for standing rest break.                     OT Education - 09/26/20 1526    Education Details HEP, ECS    Person(s) Educated Patient    Methods Explanation;Demonstration;Handout    Comprehension Verbalized understanding;Returned demonstration               OT Long Term Goals - 08/21/20 1951      OT LONG TERM GOAL #1   Title Pt will perform home exercise program with modified independence.    Baseline eval: no current program    Time 12    Period Weeks    Status New    Target Date 11/11/20      OT LONG TERM GOAL #2  Title Pt will complete dressing skills with modified independence with no rest breaks and in 20 mins or less.    Baseline eval:  increased time allowed, distant supervision    Time 6    Period Weeks    Status New    Target Date 09/30/20      OT LONG TERM GOAL #3   Title Pt will complete light homemaking skills with modified independence.    Baseline Eval:  daughter helping currently    Time 12    Period Weeks    Status New    Target Date 11/11/20      OT LONG TERM GOAL #4   Title Pt will complete laundry with modified independence including tranporting items to and from laundry room.    Baseline Eval: Daughter performing    Time 72    Period Weeks    Status New    Target Date 11/11/20      OT LONG TERM GOAL #5    Title Pt will improve grip strength bilaterally by 10# to assist with opening jars and containers.    Baseline Eval:  decreased grip    Time 6    Period Weeks    Status New    Target Date 09/30/20      Long Term Additional Goals   Additional Long Term Goals Yes      OT LONG TERM GOAL #6   Title Pt will improve ROM and strength to perform hair styling with modified independence    Baseline Eval:  Requires assist    Time 6    Period Weeks    Target Date 09/30/20      OT LONG TERM GOAL #7   Title Pt will obtain clothing from closet and drawers in preparation for self care/dressing tasks.    Baseline Eval:  Dtr performing    Time 6    Period Weeks    Status New    Target Date 09/30/20                 Plan - 09/26/20 1527    Clinical Impression Statement Pt presents with limited activity tolerance. Pt reports she is ready for her daughters to stop giving 24/7 care and will look into highering someone for laundry assistance as that is still a daunting task to her. Pt continues to have "visual nightmares" during dizzy spells which limit her, plan to follow up with neurologist as his schedule allows (currently July 2022). Pt continues to work on improving UE strength, and activity tolerance in order to improve overall functioning during ADLs, and IADL tasks.    OT Occupational Profile and History Detailed Assessment- Review of Records and additional review of physical, cognitive, psychosocial history related to current functional performance    Occupational performance deficits (Please refer to evaluation for details): ADL's;IADL's;Leisure;Social Participation    Body Structure / Function / Physical Skills ADL;Dexterity;ROM;Strength;Balance;Coordination;FMC;IADL;UE functional use;GMC;Mobility    Engineer, materials Environmental  Adaptations;Habits;Routines and Behaviors    Rehab Potential Good    Clinical Decision Making Several treatment options, min-mod  task modification necessary    Comorbidities Affecting Occupational Performance: May have comorbidities impacting occupational performance    Modification or Assistance to Complete Evaluation  No modification of tasks or assist necessary to complete eval    OT Frequency 2x / week    OT Duration 12 weeks    OT Treatment/Interventions Self-care/ADL training;Cryotherapy;Therapeutic exercise;DME and/or AE instruction;Functional Mobility Training;Cognitive remediation/compensation;Balance  training;Neuromuscular education;Manual Therapy;Moist Heat;Contrast Bath;Passive range of motion;Therapeutic activities;Patient/family education    Consulted and Agree with Plan of Care Patient           Patient will benefit from skilled therapeutic intervention in order to improve the following deficits and impairments:   Body Structure / Function / Physical Skills: ADL,Dexterity,ROM,Strength,Balance,Coordination,FMC,IADL,UE functional use,GMC,Mobility Cognitive Skills: Memory Psychosocial Skills: Environmental  Adaptations,Habits,Routines and Behaviors   Visit Diagnosis: Other lack of coordination  Muscle weakness (generalized)    Problem List Patient Active Problem List   Diagnosis Date Noted  . TIA (transient ischemic attack) 02/03/2020  . Aphasia   . Acute bronchitis 10/06/2016    Kathie Dike, M.S. OTR/L  09/26/20, 4:17 PM  ascom [phone]  [facility] MAIN [facility] SERVICES [address] [place], [place], [address] Phone: [phone]   Fax:  [phone]  Name: KEYUANA WANK MRN: [id number] Date of Birth: 19-Apr-1941

## 2020-09-27 NOTE — Therapy (Signed)
[facility] MAIN [facility] SERVICES [address] [place], [place], [address] Phone: [phone]   Fax:  [phone]  Physical Therapy Treatment/Physical Therapy Progress Note Dates of reporting period  08/16/2020 to   09/26/2020   Patient Details  Name: Kara Pennington MRN: [id number] Date of Birth: May 05, 1941 Referring Provider (PT): Dr. Lanny Cramp   Encounter Date: 09/26/2020   PT End of Session - 09/26/20 1603    Visit Number 10    Number of Visits 25    Date for PT Re-Evaluation 11/08/20    Authorization Type eval: 02/08    PT Start Time 1600    PT Stop Time 1635    PT Time Calculation (min) 35 min    Equipment Utilized During Treatment Gait belt    Activity Tolerance Patient tolerated treatment well;Patient limited by fatigue    Behavior During Therapy [facility] for tasks assessed/performed           Past Medical History:  Diagnosis Date  . Anxiety   . Depression   . GERD (gastroesophageal reflux disease)   . Hypertension     Past Surgical History:  Procedure Laterality Date  . APPENDECTOMY    . PARATHYROIDECTOMY      There were no vitals filed for this visit.   Subjective Assessment - 09/26/20 1602    Subjective Patient reports she is tired today. Reports she went to her grandaughters birthday dinner in downtown [place] with increased walking and worn out.    Patient is accompained by: Family member    Pertinent History 80 y.o. female presents to skilled physical therapy with a history of COVID-19 infection 1 year ago, hypothyroidism, and fall on 07/15/2020 with resultant nondisplaced right temporal bone fracture with associated minimal left parietal subarachnoid hemorrhage and a resolved right frontotemporal subdural hematoma who presented to [facility] hospital on 07/23/20 with intermitten but worsening word finding difficulties and expressive aphasia. While in the hospital, she had approximately 24 hours of speaking "in  gibberish" and unable to speak. After about 1 day, it was much improved and she was transferred to [facility]. She normally lives in an independent living apartment. During the week, daughter stated that she was overall doing better though she still would mix up words. On the day of admit, the daughter spoke to her at around noon and she said they had a "normal" conversation. She arrived at [facility] to visit with her at 2:15 and immediately felt that her mother was not well. She was back to speaking non-sensically. Daughter requested return to [facility]. Upon arrival she was able to follow commands and answer some questions accurately, however, she was having considerable word finding difficulties and seemed somewhat frustrated. 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    Limitations Standing;Walking    How long can you sit comfortably? unlimited time    How long can you stand comfortably? less than 5 mins    How long can you walk comfortably? less than 5 mins    Patient Stated Goals "I want to be able to get back to church and rejoin the knitting club"    Currently in Pain? No/denies          Reassessed STG and LTG goals today:    HEP- Improving yet still cues for Breathing techniques and use of BORG RPE Scale with exercises and walking.   5x Sit to stand- improved from 19 sec on 08/16/2020  down to 16.32 sec  10 MWT- from 0.37 m/s on 2/8 to 0.59 m/s today using 4WW.  TUG= from 33.43 using front wheeled walker down to 22.18 sec using 4WW.   Added 6 min Walk test goal for functional endurance and patient able to ambulate approx 1 min at 100 feet using 4WW x 2 trials today. She exhibits increased fatigue and weakness with gait yet has improved overall.   Clinical Impression: Patient has demonstrated progress with goals including increased knowledge of a home program and improved functional LE strength as seen by decreased time with  5x sit to stand test. Patient also demonstrated progress with improved gait speed and timed up and go test with no reported falls. Added a functional endurance goal as patient presents with limited functional endurance and her goal is to be able to return to independent living and continues to have her daughter staying with her until she can manage all of her ADLs and walk to/from dining hall. Patient's condition has the potential to improve in response to therapy. Maximum improvement is yet to be obtained. The anticipated improvement is attainable and reasonable in a generally predictable time.                           PT Education - 09/27/20 0844    Education Details Functional endurance techniques, Review of breathing techniques/use of BORG scale. Discussed importance of functional outcome test to measure her progress    Person(s) Educated Patient    Methods Explanation;Demonstration;Tactile cues;Verbal cues    Comprehension Verbalized understanding            PT Short Term Goals - 09/26/20 2446      PT SHORT TERM GOAL #1   Title Patient will be independent in home exercise program to improve strength/mobility for better functional independence with ADLs.    Baseline 09/26/2020- Patient requires cues for Breathing techniques with walking and reminders to use the BORG RPE scale. She is knowledgeable of seated LE strengthening but will continue to benefit from progression of HEP to more progressive exercises.    Time 6    Period Weeks    Status On-going    Target Date 09/27/20             PT Long Term Goals - 09/26/20 1612      PT LONG TERM GOAL #1   Title Patient will increase FOTO score to equal to or greater than to 61 demonstrate statistically significant improvement in mobility and quality of life.    Baseline 02/08: 45    Time 12    Period Weeks    Status New    Target Date 11/08/20      PT LONG TERM GOAL #2   Title Patient (> 6 years old) will  complete five times sit to stand test in < 15 seconds indicating an increased LE strength and improved balance.    Baseline 02/08: 19s without BUE support. 09/26/2020= 16.32 sec without UE support.    Time 12    Period Weeks    Status On-going    Target Date 11/08/20      PT LONG TERM GOAL #3   Title Patient will increase 10 meter walk test to >1.50m/s as to improve gait speed for better community ambulation and to reduce fall risk.    Baseline 02/08:0.37 m/s. 09/26/2020= 0.59 m/s using 4WW.    Time 12    Period Weeks  Status On-going    Target Date 11/08/20      PT LONG TERM GOAL #4   Title Patient will reduce timed up and go to <11 seconds to reduce fall risk and demonstrate improved transfer/gait ability.    Baseline 02/08: 33.43 with 2WW. 09/26/2020= 22.18 sec with 4WW    Time 12    Period Weeks    Status On-going    Target Date 11/08/20      PT LONG TERM GOAL #5   Title Patient will increase ABC scale score >80% to demonstrate better functional mobility and better confidence with ADLs.    Baseline 02/08: 48.25%    Time 12    Period Weeks    Status New      Additional Long Term Goals   Additional Long Term Goals Yes      PT LONG TERM GOAL #6   Title Patient will demonstrate improved 6 min walk test from only able to complete 100 feet in  prior to stoppage from fatigue to completing test at 550 feet or more without rest break, reporting some difficulty or less to improve walking tolerance with community ambulation including walking to dinining hall at independent living facility, grocery shopping, going to church,etc.    Baseline 09/26/2020= 100 feet in 1 min using 4WW- stopped due to fatigue.    Time 6    Status New    Target Date 11/08/20                 Plan - 09/26/20 0851    Personal Factors and Comorbidities Age;Comorbidity 3+    Comorbidities TIA, aphasia, acute bronchitis    Examination-Activity Limitations Stairs;Squat;Bend;Stand;Transfers;Lift     Examination-Participation Restrictions Church    Stability/Clinical Decision Making Evolving/Moderate complexity    Rehab Potential Fair    PT Frequency 2x / week    PT Duration 12 weeks    PT Treatment/Interventions ADLs/Self Care Home Management;Canalith Repostioning;Moist Heat;Traction;Electrical Stimulation;Gait training;Stair training;Functional mobility training;Therapeutic activities;Therapeutic exercise;Balance training;Neuromuscular re-education;Patient/family education;Passive range of motion;Energy conservation    PT Next Visit Plan Review and progress strengthening and balance exercises, progress cardiovascular endurance training    PT Home Exercise Plan Issued standing  HEP For strengthening today.    Consulted and Agree with Plan of Care Patient;Family member/caregiver    Family Member Consulted daughter           Patient will benefit from skilled therapeutic intervention in order to improve the following deficits and impairments:  Abnormal gait,Decreased endurance,Cardiopulmonary status limiting activity,Decreased activity tolerance,Decreased strength,Decreased balance,Decreased mobility,Difficulty walking,Improper body mechanics,Decreased safety awareness  Visit Diagnosis: Muscle weakness (generalized)  Other lack of coordination  Unsteadiness on feet  Repeated falls  Gait abnormality     Problem List Patient Active Problem List   Diagnosis Date Noted  . TIA (transient ischemic attack) 02/03/2020  . Aphasia   . Acute bronchitis 10/06/2016    Lenda Kelp, PT 09/27/2020, 9:10 AM  [facility] MAIN [facility] SERVICES [address] [place], [place], [address] Phone: [phone]   Fax:  [phone]  Name: DIORA BELLIZZI MRN: [id number] Date of Birth: September 11, 1940

## 2020-09-28 ENCOUNTER — Ambulatory Visit: Payer: Medicare Other

## 2020-09-28 ENCOUNTER — Encounter: Payer: Medicare Other | Admitting: Speech Pathology

## 2020-09-28 ENCOUNTER — Other Ambulatory Visit: Payer: Self-pay

## 2020-09-28 DIAGNOSIS — R2681 Unsteadiness on feet: Secondary | ICD-10-CM

## 2020-09-28 DIAGNOSIS — M6281 Muscle weakness (generalized): Secondary | ICD-10-CM

## 2020-09-28 DIAGNOSIS — R2689 Other abnormalities of gait and mobility: Secondary | ICD-10-CM

## 2020-09-28 DIAGNOSIS — R4701 Aphasia: Secondary | ICD-10-CM | POA: Diagnosis not present

## 2020-09-28 NOTE — Therapy (Signed)
[facility] MAIN [facility] SERVICES [address] [place], [place], [address] Phone: [phone]   Fax:  [phone]  Physical Therapy Treatment  Patient Details  Name: Kara Pennington MRN: [id number] Date of Birth: 11-27-1940 Referring Provider (PT): Dr. Lanny Cramp   Encounter Date: 09/28/2020   PT End of Session - 09/28/20 1613    Visit Number 11    Number of Visits 25    Date for PT Re-Evaluation 11/08/20    Authorization Type eval: 02/08    PT Start Time 1509    PT Stop Time 1559    PT Time Calculation (min) 50 min    Equipment Utilized During Treatment Gait belt    Activity Tolerance Patient tolerated treatment well;Patient limited by fatigue    Behavior During Therapy [facility] for tasks assessed/performed           Past Medical History:  Diagnosis Date  . Anxiety   . Depression   . GERD (gastroesophageal reflux disease)   . Hypertension     Past Surgical History:  Procedure Laterality Date  . APPENDECTOMY    . PARATHYROIDECTOMY      There were no vitals filed for this visit.    Subjective Assessment - 09/28/20 1610    Subjective Pt reports no pain. Pt has upcoming appointment with neurologist on April 26th. Pt reports chronic R LE ankle/foot swelling, but says she has not informed her doctor about it yet.    Patient is accompained by: Family member    Pertinent History 80 y.o. female presents to skilled physical therapy with a history of COVID-19 infection 1 year ago, hypothyroidism, and fall on 07/15/2020 with resultant nondisplaced right temporal bone fracture with associated minimal left parietal subarachnoid hemorrhage and a resolved right frontotemporal subdural hematoma who presented to [facility] hospital on 07/23/20 with intermitten but worsening word finding difficulties and expressive aphasia. While in the hospital, she had approximately 24 hours of speaking "in gibberish" and unable to speak. After about 1 day, it was much  improved and she was transferred to [facility]. She normally lives in an independent living apartment. During the week, daughter stated that she was overall doing better though she still would mix up words. On the day of admit, the daughter spoke to her at around noon and she said they had a "normal" conversation. She arrived at [facility] to visit with her at 2:15 and immediately felt that her mother was not well. She was back to speaking non-sensically. Daughter requested return to [facility]. Upon arrival she was able to follow commands and answer some questions accurately, however, she was having considerable word finding difficulties and seemed somewhat frustrated. 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    Limitations Standing;Walking    How long can you sit comfortably? unlimited time    How long can you stand comfortably? less than 5 mins    How long can you walk comfortably? less than 5 mins    Patient Stated Goals "I want to be able to get back to church and rejoin the knitting club"    Currently in Pain? No/denies          TREATMENT  Pt requires frequent rest breaks throughout session  Vitals monitored with nustep, Modified Borg used for RPE, closely supervised:  Baseline: Borg RPE 0.5, SPO2 97%, HR 58 bpm Nustep L2 x 3 min LE seat 6 -Instructed in maintaining > 50 SPM  At 3 min cuing to increase SPM >60s, RPE 1/10; pt with difficulty increasing SPM above 50s, SPO2 95%, HR 58 bpm; pt maintained SPM 40s-50s  At min 6.5, RPE 3/10, SPO2% 94, HR 58 bpm, SPM >60s At minute 8 RPE 4-5/10, SPO2 96, HR 56 bpm, SPM 40s-60s  Seated and standing therex: demonstration/VC/TC for all exercises Hip march- 3x12 Hip abd- 3x12 Borg RPE 4/10 Hip ext- 2x12 reps BLE Borge RPE 5/10 Standing Ankle Rorckers dorsiflexion/plantarflexion- 10 reps BLE Seated Ankle Rorckers dorsiflexion/plantarflexion- 10 reps BLE Minisquats- x10 reps    Examination of BLEs due to reports of chronic R foot/ankle swelling. R LE is swollen up to distal knee and down to dorsum of R foot. No swelling noted on LLE. BLEs are warm to touch, skin dry and flaking, no hair on BLEs where pt reports she "just stopped growing hair on my legs and in my armpits."  Noted bruising just superior to 3rd and 4th toe of R foot, but pt reports no injury. Pt is TTP along both R and L calf and TTP around inferior border of R patella medially and laterally.  PT instructed pt to report chronic RLE swelling to her PCP, and to her neurologist at upcoming appointment.   Pt educated throughout session about proper posture and technique with exercises. Improved exercise technique, movement at target joints, use of target muscles after min to mod verbal, visual, tactile cues.     PT Short Term Goals - 09/26/20 1610      PT SHORT TERM GOAL #1   Title Patient will be independent in home exercise program to improve strength/mobility for better functional independence with ADLs.    Baseline 09/26/2020- Patient requires cues for Breathing techniques with walking and reminders to use the BORG RPE scale. She is knowledgeable of seated LE strengthening but will continue to benefit from progression of HEP to more progressive exercises.    Time 6    Period Weeks    Status On-going    Target Date 09/27/20             PT Long Term Goals - 09/26/20 1612      PT LONG TERM GOAL #1   Title Patient will increase FOTO score to equal to or greater than to 61 demonstrate statistically significant improvement in mobility and quality of life.    Baseline 02/08: 45    Time 12    Period Weeks    Status New    Target Date 11/08/20      PT LONG TERM GOAL #2   Title Patient (> 85 years old) will complete five times sit to stand test in < 15 seconds indicating an increased LE strength and improved balance.    Baseline 02/08: 19s without BUE support. 09/26/2020= 16.32 sec without UE support.     Time 12    Period Weeks    Status On-going    Target Date 11/08/20      PT LONG TERM GOAL #3   Title Patient will increase 10 meter walk test to >1.46m/s as to improve gait speed for better community ambulation and to reduce fall risk.    Baseline 02/08:0.37 m/s. 09/26/2020= 0.59 m/s using 4WW.    Time 12    Period Weeks    Status On-going    Target Date 11/08/20      PT LONG TERM GOAL #4   Title Patient will reduce timed up and go to <11 seconds to reduce fall risk  and demonstrate improved transfer/gait ability.    Baseline 02/08: 33.43 with 2WW. 09/26/2020= 22.18 sec with 4WW    Time 12    Period Weeks    Status On-going    Target Date 11/08/20      PT LONG TERM GOAL #5   Title Patient will increase ABC scale score >80% to demonstrate better functional mobility and better confidence with ADLs.    Baseline 02/08: 48.25%    Time 12    Period Weeks    Status New      Additional Long Term Goals   Additional Long Term Goals Yes      PT LONG TERM GOAL #6   Title Patient will demonstrate improved 6 min walk test from only able to complete 100 feet in  prior to stoppage from fatigue to completing test at 550 feet or more without rest break, reporting some difficulty or less to improve walking tolerance with community ambulation including walking to dinining hall at independent living facility, grocery shopping, going to church,etc.    Baseline 09/26/2020= 100 feet in 1 min using 4WW- stopped due to fatigue.    Time 6    Status New    Target Date 11/08/20                 Plan - 09/28/20 1614    Clinical Impression Statement Pt presents with swelling of R foot, ankle and calf which she reports is chronic in nature and is TTP. Pt is TTP along LLE but does not present with LLE swelling. PT instructed pt to report findings of examination of LEs to her PCP and neurologist (see note for details). Pt and her daughter verbalized understanding. Pt able to perform multiple reps  of therex this session where her RPE ranged from 0.5-5/10. However, pt still requires frequent rest breaks between sets. Pt will benefit from further skilled therapy to improve BLE strength, endurance and activity tolerance to improve ease with ADLs and QOL.    Personal Factors and Comorbidities Age;Comorbidity 3+    Comorbidities TIA, aphasia, acute bronchitis    Examination-Activity Limitations Stairs;Squat;Bend;Stand;Transfers;Lift    Examination-Participation Restrictions Church    Stability/Clinical Decision Making Evolving/Moderate complexity    Rehab Potential Fair    PT Frequency 2x / week    PT Duration 12 weeks    PT Treatment/Interventions ADLs/Self Care Home Management;Canalith Repostioning;Moist Heat;Traction;Electrical Stimulation;Gait training;Stair training;Functional mobility training;Therapeutic activities;Therapeutic exercise;Balance training;Neuromuscular re-education;Patient/family education;Passive range of motion;Energy conservation    PT Next Visit Plan Review and progress strengthening and balance exercises, progress cardiovascular endurance training; progress standing therex    PT Home Exercise Plan Issued standing  HEP For strengthening today.    Consulted and Agree with Plan of Care Patient;Family member/caregiver    Family Member Consulted daughter           Patient will benefit from skilled therapeutic intervention in order to improve the following deficits and impairments:  Abnormal gait,Decreased endurance,Cardiopulmonary status limiting activity,Decreased activity tolerance,Decreased strength,Decreased balance,Decreased mobility,Difficulty walking,Improper body mechanics,Decreased safety awareness  Visit Diagnosis: Other abnormalities of gait and mobility  Muscle weakness (generalized)  Unsteadiness on feet     Problem List Patient Active Problem List   Diagnosis Date Noted  . TIA (transient ischemic attack) 02/03/2020  . Aphasia   . Acute  bronchitis 10/06/2016   Temple Pacini PT, DPT 09/28/2020, 4:22 PM  [place] MAIN [facility] SERVICES [address] [place], [place], [address] Phone: [phone]  Fax:  [phone]  Name: Kara Pennington MRN: [id number] Date of Birth: 1940/09/14

## 2020-10-03 ENCOUNTER — Ambulatory Visit: Payer: Medicare Other

## 2020-10-03 ENCOUNTER — Other Ambulatory Visit: Payer: Self-pay

## 2020-10-03 ENCOUNTER — Encounter: Payer: Medicare Other | Admitting: Speech Pathology

## 2020-10-03 ENCOUNTER — Ambulatory Visit: Payer: Medicare Other | Admitting: Occupational Therapy

## 2020-10-03 DIAGNOSIS — M6281 Muscle weakness (generalized): Secondary | ICD-10-CM

## 2020-10-03 DIAGNOSIS — R2681 Unsteadiness on feet: Secondary | ICD-10-CM

## 2020-10-03 DIAGNOSIS — R278 Other lack of coordination: Secondary | ICD-10-CM

## 2020-10-03 DIAGNOSIS — R4701 Aphasia: Secondary | ICD-10-CM | POA: Diagnosis not present

## 2020-10-03 DIAGNOSIS — R269 Unspecified abnormalities of gait and mobility: Secondary | ICD-10-CM

## 2020-10-03 DIAGNOSIS — R2689 Other abnormalities of gait and mobility: Secondary | ICD-10-CM

## 2020-10-03 DIAGNOSIS — R296 Repeated falls: Secondary | ICD-10-CM

## 2020-10-03 NOTE — Therapy (Signed)
[facility] MAIN [facility] SERVICES [address] [place], [place], [address] Phone: [phone]   Fax:  [phone]  Physical Therapy Treatment  Patient Details  Name: Kara Pennington MRN: [id number] Date of Birth: 1940/10/03 Referring Provider (PT): Dr. Lanny Cramp   Encounter Date: 10/03/2020   PT End of Session - 10/03/20 1609    Visit Number 12    Number of Visits 25    Date for PT Re-Evaluation 11/08/20    Authorization Type eval: 02/08    PT Start Time 1605    PT Stop Time 1645    PT Time Calculation (min) 40 min    Equipment Utilized During Treatment Gait belt    Activity Tolerance Patient tolerated treatment well;Patient limited by fatigue    Behavior During Therapy [facility] for tasks assessed/performed           Past Medical History:  Diagnosis Date  . Anxiety   . Depression   . GERD (gastroesophageal reflux disease)   . Hypertension     Past Surgical History:  Procedure Laterality Date  . APPENDECTOMY    . PARATHYROIDECTOMY      There were no vitals filed for this visit.   Subjective Assessment - 10/03/20 1607    Subjective Patient reports that she sent her daughter home for the weekend to see how she could manage without assist and reports she did well except for reaching overhead in her kitchen. Reports that she was able to walk to dinning hall without resting with taxing effort. "It was not easy but I did it."    Patient is accompained by: Family member    Pertinent History 80 y.o. female presents to skilled physical therapy with a history of COVID-19 infection 1 year ago, hypothyroidism, and fall on 07/15/2020 with resultant nondisplaced right temporal bone fracture with associated minimal left parietal subarachnoid hemorrhage and a resolved right frontotemporal subdural hematoma who presented to [facility] hospital on 07/23/20 with intermitten but worsening word finding difficulties and expressive aphasia. While in the  hospital, she had approximately 24 hours of speaking "in gibberish" and unable to speak. After about 1 day, it was much improved and she was transferred to [facility]. She normally lives in an independent living apartment. During the week, daughter stated that she was overall doing better though she still would mix up words. On the day of admit, the daughter spoke to her at around noon and she said they had a "normal" conversation. She arrived at [facility] to visit with her at 2:15 and immediately felt that her mother was not well. She was back to speaking non-sensically. Daughter requested return to [facility]. Upon arrival she was able to follow commands and answer some questions accurately, however, she was having considerable word finding difficulties and seemed somewhat frustrated. 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    Limitations Standing;Walking    How long can you sit comfortably? unlimited time    How long can you stand comfortably? less than 5 mins    How long can you walk comfortably? less than 5 mins    Patient Stated Goals "I want to be able to get back to church and rejoin the knitting club"    Currently in Pain? No/denies           Interventions:  6 min walk test- for exercise- 2 min 20 sec completed and 200 feet- O2 sat= 95% and HR=72 bpm.  Patient reports 7/10 on Modified BORG RPE scale.   2 min step test for exercise- 07 sec completed 20 steps on BLE.   Sit to stand x 10 reps- Without UE support and cues to scoot to edge of seat.  Side step over hurdles x 10 reps BLE-  Patient presented with increased  difficulty yet able to complete. O2 sat= 96%. HR= 68 bpm  Patient required extensive rest breaks between each exercises.   Clinical Impression: Patient able to demo progress with functional endurance versus last week and able to progress overall with standing endurance. She is still limited by shortness of  breath and debility and will benefit from The patient continues to benefit from additional skilled PT services to improve symptoms, progress LE strengthening, gait and balance for improved quality of life.                       PT Education - 10/03/20 1716    Education Details Review of BORG scale and exercise technqiue    Person(s) Educated Patient    Methods Explanation;Demonstration;Tactile cues;Verbal cues;Handout    Comprehension Verbalized understanding;Returned demonstration;Verbal cues required;Tactile cues required;Need further instruction            PT Short Term Goals - 09/26/20 2725      PT SHORT TERM GOAL #1   Title Patient will be independent in home exercise program to improve strength/mobility for better functional independence with ADLs.    Baseline 09/26/2020- Patient requires cues for Breathing techniques with walking and reminders to use the BORG RPE scale. She is knowledgeable of seated LE strengthening but will continue to benefit from progression of HEP to more progressive exercises.    Time 6    Period Weeks    Status On-going    Target Date 09/27/20             PT Long Term Goals - 09/26/20 1612      PT LONG TERM GOAL #1   Title Patient will increase FOTO score to equal to or greater than to 61 demonstrate statistically significant improvement in mobility and quality of life.    Baseline 02/08: 45    Time 12    Period Weeks    Status New    Target Date 11/08/20      PT LONG TERM GOAL #2   Title Patient (> 37 years old) will complete five times sit to stand test in < 15 seconds indicating an increased LE strength and improved balance.    Baseline 02/08: 19s without BUE support. 09/26/2020= 16.32 sec without UE support.    Time 12    Period Weeks    Status On-going    Target Date 11/08/20      PT LONG TERM GOAL #3   Title Patient will increase 10 meter walk test to >1.40m/s as to improve gait speed for better community ambulation  and to reduce fall risk.    Baseline 02/08:0.37 m/s. 09/26/2020= 0.59 m/s using 4WW.    Time 12    Period Weeks    Status On-going    Target Date 11/08/20      PT LONG TERM GOAL #4   Title Patient will reduce timed up and go to <11 seconds to reduce fall risk and demonstrate improved transfer/gait ability.    Baseline 02/08: 33.43 with 2WW. 09/26/2020= 22.18 sec with 4WW    Time 12    Period Weeks    Status On-going  Target Date 11/08/20      PT LONG TERM GOAL #5   Title Patient will increase ABC scale score >80% to demonstrate better functional mobility and better confidence with ADLs.    Baseline 02/08: 48.25%    Time 12    Period Weeks    Status New      Additional Long Term Goals   Additional Long Term Goals Yes      PT LONG TERM GOAL #6   Title Patient will demonstrate improved 6 min walk test from only able to complete 100 feet in  prior to stoppage from fatigue to completing test at 550 feet or more without rest break, reporting some difficulty or less to improve walking tolerance with community ambulation including walking to dinining hall at independent living facility, grocery shopping, going to church,etc.    Baseline 09/26/2020= 100 feet in 1 min using 4WW- stopped due to fatigue.    Time 6    Status New    Target Date 11/08/20                 Plan - 10/03/20 1610    Clinical Impression Statement Patient able to demo progress with functional endurance versus last week and able to progress overall with standing endurance. She is still limited by shortness of breath and debility and will benefit from The patient continues to benefit from additional skilled PT services to improve symptoms, progress LE strengthening, gait and balance for improved quality of life.    Personal Factors and Comorbidities Age;Comorbidity 3+    Comorbidities TIA, aphasia, acute bronchitis    Examination-Activity Limitations Stairs;Squat;Bend;Stand;Transfers;Lift     Examination-Participation Restrictions Church    Stability/Clinical Decision Making Evolving/Moderate complexity    Rehab Potential Fair    PT Frequency 2x / week    PT Duration 12 weeks    PT Treatment/Interventions ADLs/Self Care Home Management;Canalith Repostioning;Moist Heat;Traction;Electrical Stimulation;Gait training;Stair training;Functional mobility training;Therapeutic activities;Therapeutic exercise;Balance training;Neuromuscular re-education;Patient/family education;Passive range of motion;Energy conservation    PT Next Visit Plan Review and progress strengthening and balance exercises, progress cardiovascular endurance training; progress standing therex    PT Home Exercise Plan Issued standing  HEP For strengthening today.    Consulted and Agree with Plan of Care Patient;Family member/caregiver    Family Member Consulted daughter           Patient will benefit from skilled therapeutic intervention in order to improve the following deficits and impairments:  Abnormal gait,Decreased endurance,Cardiopulmonary status limiting activity,Decreased activity tolerance,Decreased strength,Decreased balance,Decreased mobility,Difficulty walking,Improper body mechanics,Decreased safety awareness  Visit Diagnosis: Other abnormalities of gait and mobility  Muscle weakness (generalized)  Unsteadiness on feet  Other lack of coordination  Repeated falls  Gait abnormality     Problem List Patient Active Problem List   Diagnosis Date Noted  . TIA (transient ischemic attack) 02/03/2020  . Aphasia   . Acute bronchitis 10/06/2016    Lenda Kelp, PT 10/03/2020, 5:18 PM  [facility] MAIN [facility] SERVICES [address] [place], [place], [address] Phone: [phone]   Fax:  [phone]  Name: Kara Pennington MRN: [id number] Date of Birth: Nov 07, 1940

## 2020-10-04 ENCOUNTER — Encounter: Payer: Self-pay | Admitting: Occupational Therapy

## 2020-10-04 NOTE — Therapy (Signed)
[place] [facility] MAIN [facility] SERVICES [address] [place], [place], [address] Phone: [phone]   Fax:  [phone]  Occupational Therapy Treatment  Patient Details  Name: Kara Pennington MRN: [id number] Date of Birth: 07-07-1941 No data recorded  Encounter Date: 10/03/2020   OT End of Session - 10/04/20 2054    Visit Number 7    Number of Visits 24    Date for OT Re-Evaluation 11/11/20    OT Start Time 1506    OT Stop Time 1545    OT Time Calculation (min) 39 min    Activity Tolerance Patient tolerated treatment well    Behavior During Therapy [facility] for tasks assessed/performed           Past Medical History:  Diagnosis Date  . Anxiety   . Depression   . GERD (gastroesophageal reflux disease)   . Hypertension     Past Surgical History:  Procedure Laterality Date  . APPENDECTOMY    . PARATHYROIDECTOMY      There were no vitals filed for this visit.   Subjective Assessment - 10/04/20 2052    Subjective  Patient reports she is doing well, her daughter went home over the weekend and she did well managing for herself.  She still has difficulty with mobility with getting to and from the dining areas.    Pertinent History Pt is an 80 year old female with a history of COVID-19 infection 1 year ago, hypothyroidism, and recent fall downstairs on 07/15/2020. Pt with resultant Trace L parietal SAH, Trace R parietal, L frontal SAH and R frontoparietal SDH. Pt discharged from Inpatient rehab on Thursday August 11, 2020.    Patient Stated Goals Pt would like to get back to driving, be as independent as she was before.    Currently in Pain? No/denies    Multiple Pain Sites No           Therapeutic Exercise: UB strengthening with use of UBE forwards/backwards, alternating levels of resistance from 1.0 to 2.0 for 8 mins total, therapist in constant attendance to ensure grip and to adjust settings.    2# dowel exercises for shoulder flexion, ABD,  ADD, chest press, forwards and backwards circles for 10 reps for 2 sets, therapist demonstration and cues for proper form and technique.    Patient discussing her daughter will need to go home and will be moving, patient feels she can stay alone now at the assisted living facility.  Has been doing her self care with modified independence.  The only thing she continues to struggle with is getting to and from the dining area, she may be hiring someone to do her laundry.  Some difficulty with opening packages, containers. She had a hard time getting an item off the shelf in the closet yesterday, used reacher to assist.  Goals updated to reflect current progress.   Response to tx: Patient has made good progress in all areas, still working on strength and activity tolerance for mobility skills to get to dining area at assisted living.  She did have some difficulty with reaching and getting items from cabinets.  Will plan to address next session with reaching into closet, cabinets and use of reacher as needed and perform in standing.  Continue OT to increase independence in necessary daily tasks.                    OT Education - 10/04/20 2054    Education Details HEP  Person(s) Educated Patient    Methods Explanation;Demonstration;Handout    Comprehension Verbalized understanding;Returned demonstration               OT Long Term Goals - 10/03/20 1544      OT LONG TERM GOAL #1   Title Pt will perform home exercise program with modified independence.    Baseline eval: no current program    Time 12    Period Weeks    Status On-going    Target Date 11/11/20      OT LONG TERM GOAL #2   Title Pt will complete dressing skills with modified independence with no rest breaks and in 20 mins or less.    Baseline eval:  increased time allowed, distant supervision    Time 6    Period Weeks    Status Achieved      OT LONG TERM GOAL #3   Title Pt will complete light homemaking  skills with modified independence.    Baseline Eval:  daughter helping currently    Time 55    Period Weeks    Status Achieved      OT LONG TERM GOAL #4   Title Pt will complete laundry with modified independence including tranporting items to and from laundry room.    Baseline Eval: Daughter performing    Time 12    Period Weeks    Status On-going      OT LONG TERM GOAL #5   Title Pt will improve grip strength bilaterally by 10# to assist with opening jars and containers.    Baseline Eval:  decreased grip    Time 6    Period Weeks    Status On-going    Target Date 11/11/20      OT LONG TERM GOAL #6   Title Pt will improve ROM and strength to perform hair styling with modified independence    Baseline Eval:  Requires assist    Time 6    Period Weeks    Status Achieved      OT LONG TERM GOAL #7   Title Pt will obtain clothing from closet and drawers in preparation for self care/dressing tasks.    Baseline Eval:  Dtr performing    Time 6    Period Weeks    Status Achieved                 Plan - 10/04/20 2055    Clinical Impression Statement Patient has made good progress in all areas, still working on strength and activity tolerance for mobility skills to get to dining area at assisted living.  She did have some difficulty with reaching and getting items from cabinets.  Will plan to address next session with reaching into closet, cabinets and use of reacher as needed and perform in standing.  Continue OT to increase independence in necessary daily tasks.    OT Occupational Profile and History Detailed Assessment- Review of Records and additional review of physical, cognitive, psychosocial history related to current functional performance    Occupational performance deficits (Please refer to evaluation for details): ADL's;IADL's;Leisure;Social Participation    Body Structure / Function / Physical Skills ADL;Dexterity;ROM;Strength;Balance;Coordination;FMC;IADL;UE  functional use;GMC;Mobility    Engineer, materials Environmental  Adaptations;Habits;Routines and Behaviors    Rehab Potential Good    Clinical Decision Making Several treatment options, min-mod task modification necessary    Comorbidities Affecting Occupational Performance: May have comorbidities impacting occupational performance    Modification  or Assistance to Complete Evaluation  No modification of tasks or assist necessary to complete eval    OT Frequency 2x / week    OT Duration 12 weeks    OT Treatment/Interventions Self-care/ADL training;Cryotherapy;Therapeutic exercise;DME and/or AE instruction;Functional Mobility Training;Cognitive remediation/compensation;Balance training;Neuromuscular education;Manual Therapy;Moist Heat;Contrast Bath;Passive range of motion;Therapeutic activities;Patient/family education    Consulted and Agree with Plan of Care Patient           Patient will benefit from skilled therapeutic intervention in order to improve the following deficits and impairments:   Body Structure / Function / Physical Skills: ADL,Dexterity,ROM,Strength,Balance,Coordination,FMC,IADL,UE functional use,GMC,Mobility Cognitive Skills: Memory Psychosocial Skills: Environmental  Adaptations,Habits,Routines and Behaviors   Visit Diagnosis: Muscle weakness (generalized)  Other lack of coordination  Unsteadiness on feet    Problem List Patient Active Problem List   Diagnosis Date Noted  . TIA (transient ischemic attack) 02/03/2020  . Aphasia   . Acute bronchitis 10/06/2016   Daishia Fetterly T Arne Cleveland, OTR/L, CLT  Mande Auvil 10/04/2020, 9:05 PM  [place] [facility] MAIN [facility] SERVICES [address] [place], [place], [address] Phone: [phone]   Fax:  [phone]  Name: Kara Pennington MRN: [id number] Date of Birth: 1940-07-12

## 2020-10-05 ENCOUNTER — Other Ambulatory Visit: Payer: Self-pay

## 2020-10-05 ENCOUNTER — Ambulatory Visit: Payer: Medicare Other | Admitting: Occupational Therapy

## 2020-10-05 ENCOUNTER — Encounter: Payer: Self-pay | Admitting: Occupational Therapy

## 2020-10-05 ENCOUNTER — Encounter: Payer: Medicare Other | Admitting: Speech Pathology

## 2020-10-05 ENCOUNTER — Ambulatory Visit: Payer: Medicare Other

## 2020-10-05 DIAGNOSIS — R4701 Aphasia: Secondary | ICD-10-CM | POA: Diagnosis not present

## 2020-10-05 DIAGNOSIS — M6281 Muscle weakness (generalized): Secondary | ICD-10-CM

## 2020-10-05 DIAGNOSIS — R278 Other lack of coordination: Secondary | ICD-10-CM

## 2020-10-05 DIAGNOSIS — R2681 Unsteadiness on feet: Secondary | ICD-10-CM

## 2020-10-05 DIAGNOSIS — R2689 Other abnormalities of gait and mobility: Secondary | ICD-10-CM

## 2020-10-05 NOTE — Therapy (Signed)
[place] [facility] MAIN [facility] SERVICES [address] [place], [place], [address] Phone: [phone]   Fax:  [phone]  Physical Therapy Treatment  Patient Details  Name: Kara Pennington MRN: [id number] Date of Birth: 1941/01/02 Referring Provider (PT): Dr. Lanny Cramp   Encounter Date: 10/05/2020   PT End of Session - 10/05/20 1710    Visit Number 13    Number of Visits 25    Date for PT Re-Evaluation 11/08/20    Authorization Type eval: 02/08    PT Start Time 1606    PT Stop Time 1645    PT Time Calculation (min) 39 min    Equipment Utilized During Treatment Gait belt    Activity Tolerance Patient tolerated treatment well;Patient limited by fatigue    Behavior During Therapy [facility] for tasks assessed/performed           Past Medical History:  Diagnosis Date  . Anxiety   . Depression   . GERD (gastroesophageal reflux disease)   . Hypertension     Past Surgical History:  Procedure Laterality Date  . APPENDECTOMY    . PARATHYROIDECTOMY      There were no vitals filed for this visit.   Subjective Assessment - 10/05/20 1623    Patient is accompained by: Family member    Pertinent History 80 y.o. female presents to skilled physical therapy with a history of COVID-19 infection 1 year ago, hypothyroidism, and fall on 07/15/2020 with resultant nondisplaced right temporal bone fracture with associated minimal left parietal subarachnoid hemorrhage and a resolved right frontotemporal subdural hematoma who presented to [facility] hospital on 07/23/20 with intermitten but worsening word finding difficulties and expressive aphasia. While in the hospital, she had approximately 24 hours of speaking "in gibberish" and unable to speak. After about 1 day, it was much improved and she was transferred to [facility]. She normally lives in an independent living apartment. During the week, daughter stated that she was overall doing better though she  still would mix up words. On the day of admit, the daughter spoke to her at around noon and she said they had a "normal" conversation. She arrived at [facility] to visit with her at 2:15 and immediately felt that her mother was not well. She was back to speaking non-sensically. Daughter requested return to [facility]. Upon arrival she was able to follow commands and answer some questions accurately, however, she was having considerable word finding difficulties and seemed somewhat frustrated. 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    Limitations Standing;Walking    How long can you sit comfortably? unlimited time    How long can you stand comfortably? less than 5 mins    How long can you walk comfortably? less than 5 mins    Patient Stated Goals "I want to be able to get back to church and rejoin the knitting club"              Interventions:  Ambulation in clinic using 4WW: 1) 1 min 30 sec approx 150 feet- O2 sat= 95% and HR=66  bpm. Patient reports 4/10 on Modified BORG RPE scale.   2) 2 min 49 sec at approx 300 feet - 1st part- patient ambulated with hands on handles of walker but 2nd lap she requested to position more forearm on armrest and able to continue and reported 6/10 upon completion.   2 min step test for exercise- 15  sec  completed 20 steps on BLE. (Patient stopped secondary to fatigue- rated at 5/10)  Sit to stand x 10 reps- Without UE support from seat of 4WW.   Side step over hurdles x 7 reps BLE-  Patient presented with increased  difficulty yet able to complete. O2 sat= 95%. HR= 65 bpm     Clinical Impression: Patient able to progress her functional endurance as seen by increased overall total distance walked today. She was able to use the BORG scale appropriately and will benefit from additional skilled PT services to improve stamina, , progress LE strengthening, gait and balance for improved quality of  life.                            PT Short Term Goals - 09/26/20 6213      PT SHORT TERM GOAL #1   Title Patient will be independent in home exercise program to improve strength/mobility for better functional independence with ADLs.    Baseline 09/26/2020- Patient requires cues for Breathing techniques with walking and reminders to use the BORG RPE scale. She is knowledgeable of seated LE strengthening but will continue to benefit from progression of HEP to more progressive exercises.    Time 6    Period Weeks    Status On-going    Target Date 09/27/20             PT Long Term Goals - 09/26/20 1612      PT LONG TERM GOAL #1   Title Patient will increase FOTO score to equal to or greater than to 61 demonstrate statistically significant improvement in mobility and quality of life.    Baseline 02/08: 45    Time 12    Period Weeks    Status New    Target Date 11/08/20      PT LONG TERM GOAL #2   Title Patient (> 30 years old) will complete five times sit to stand test in < 15 seconds indicating an increased LE strength and improved balance.    Baseline 02/08: 19s without BUE support. 09/26/2020= 16.32 sec without UE support.    Time 12    Period Weeks    Status On-going    Target Date 11/08/20      PT LONG TERM GOAL #3   Title Patient will increase 10 meter walk test to >1.70m/s as to improve gait speed for better community ambulation and to reduce fall risk.    Baseline 02/08:0.37 m/s. 09/26/2020= 0.59 m/s using 4WW.    Time 12    Period Weeks    Status On-going    Target Date 11/08/20      PT LONG TERM GOAL #4   Title Patient will reduce timed up and go to <11 seconds to reduce fall risk and demonstrate improved transfer/gait ability.    Baseline 02/08: 33.43 with 2WW. 09/26/2020= 22.18 sec with 4WW    Time 12    Period Weeks    Status On-going    Target Date 11/08/20      PT LONG TERM GOAL #5   Title Patient will increase ABC scale score >80% to  demonstrate better functional mobility and better confidence with ADLs.    Baseline 02/08: 48.25%    Time 12    Period Weeks    Status New      Additional Long Term Goals   Additional Long Term Goals Yes      PT LONG  TERM GOAL #6   Title Patient will demonstrate improved 6 min walk test from only able to complete 100 feet in  prior to stoppage from fatigue to completing test at 550 feet or more without rest break, reporting some difficulty or less to improve walking tolerance with community ambulation including walking to dinining hall at independent living facility, grocery shopping, going to church,etc.    Baseline 09/26/2020= 100 feet in 1 min using 4WW- stopped due to fatigue.    Time 6    Status New    Target Date 11/08/20                 Plan - 10/05/20 1709    Clinical Impression Statement Patient able to progress her functional endurance as seen by increased overall total distance walked today. She was able to use the BORG scale appropriately and will benefit from additional skilled PT services to improve stamina, , progress LE strengthening, gait and balance for improved quality of life.    Personal Factors and Comorbidities Age;Comorbidity 3+    Comorbidities TIA, aphasia, acute bronchitis    Examination-Activity Limitations Stairs;Squat;Bend;Stand;Transfers;Lift    Examination-Participation Restrictions Church    Stability/Clinical Decision Making Evolving/Moderate complexity    Rehab Potential Fair    PT Frequency 2x / week    PT Duration 12 weeks    PT Treatment/Interventions ADLs/Self Care Home Management;Canalith Repostioning;Moist Heat;Traction;Electrical Stimulation;Gait training;Stair training;Functional mobility training;Therapeutic activities;Therapeutic exercise;Balance training;Neuromuscular re-education;Patient/family education;Passive range of motion;Energy conservation    PT Next Visit Plan progress strengthening and balance exercises, progress  cardiovascular endurance training; progress standing therex    PT Home Exercise Plan --    Consulted and Agree with Plan of Care Patient;Family member/caregiver    Family Member Consulted daughter           Patient will benefit from skilled therapeutic intervention in order to improve the following deficits and impairments:  Abnormal gait,Decreased endurance,Cardiopulmonary status limiting activity,Decreased activity tolerance,Decreased strength,Decreased balance,Decreased mobility,Difficulty walking,Improper body mechanics,Decreased safety awareness  Visit Diagnosis: Muscle weakness (generalized)  Other lack of coordination  Unsteadiness on feet  Other abnormalities of gait and mobility     Problem List Patient Active Problem List   Diagnosis Date Noted  . TIA (transient ischemic attack) 02/03/2020  . Aphasia   . Acute bronchitis 10/06/2016    Lenda Kelp, PT 10/05/2020, 5:12 PM  [place] [facility] MAIN [facility] SERVICES [address] [place], [place], [address] Phone: [phone]   Fax:  [phone]  Name: Kara Pennington MRN: [id number] Date of Birth: [birth year]

## 2020-10-05 NOTE — Therapy (Signed)
[place] [facility] MAIN [facility] SERVICES [address] [place], [place], [address] Phone: [phone]   Fax:  [phone]  Occupational Therapy Treatment  Patient Details  Name: Kara Pennington MRN: [id number] Date of Birth: 03/19/1941 No data recorded  Encounter Date: 10/05/2020   OT End of Session - 10/05/20 1801    Visit Number 8    Number of Visits 24    Date for OT Re-Evaluation 11/11/20    OT Start Time 1506    OT Stop Time 1545    OT Time Calculation (min) 39 min    Activity Tolerance Patient tolerated treatment well    Behavior During Therapy [facility] for tasks assessed/performed           Past Medical History:  Diagnosis Date  . Anxiety   . Depression   . GERD (gastroesophageal reflux disease)   . Hypertension     Past Surgical History:  Procedure Laterality Date  . APPENDECTOMY    . PARATHYROIDECTOMY      There were no vitals filed for this visit.   Subjective Assessment - 10/05/20 1759    Subjective  Pt reports she trialed living independently this weekend and it went well. Plan for her daughter to return home this weekend and pt will be on her own. Plan for pt to record any difficulties with I/ADLs over the weekend for next session. Will assess pt for discharge next session.    Patient is accompanied by: Family member    Pertinent History Pt is an 80 year old female with a history of COVID-19 infection 1 year ago, hypothyroidism, and recent fall downstairs on 07/15/2020. Pt with resultant Trace L parietal SAH, Trace R parietal, L frontal SAH and R frontoparietal SDH. Pt discharged from Inpatient rehab on Thursday August 11, 2020.    Patient Stated Goals Pt would like to get back to driving, be as independent as she was before.            Self Care Pt demonstrated use of reacher to remove clothes on hanger in closet and reach cones on head high cabinet shelf from seated position. Pt demonstrated safe ability to stand from chair and  reach cones on shoulder height, head height, knee height, and above head height shelves. Pt required cues for positioning body close to shelf and to stabilize with LUE when reaching. Extensive falls education discussed and AE education - all questions answered. Pt plans to research buying long handled sponge and bidet. Home setup discussed to improve maneuverability of wheelchair and accessing bathroom - pt plans to remove x2 chairs. Daughter to assist with reorganizing food/pantry to place often used items in reach.          OT Education - 10/05/20 1801    Education Details HEP, AE education    Person(s) Educated Patient;Child(ren)    Methods Explanation;Demonstration;Handout    Comprehension Verbalized understanding;Returned demonstration               OT Long Term Goals - 10/03/20 1544      OT LONG TERM GOAL #1   Title Pt will perform home exercise program with modified independence.    Baseline eval: no current program    Time 12    Period Weeks    Status On-going    Target Date 11/11/20      OT LONG TERM GOAL #2   Title Pt will complete dressing skills with modified independence with no rest breaks and in 20 mins  or less.    Baseline eval:  increased time allowed, distant supervision    Time 6    Period Weeks    Status Achieved      OT LONG TERM GOAL #3   Title Pt will complete light homemaking skills with modified independence.    Baseline Eval:  daughter helping currently    Time 1    Period Weeks    Status Achieved      OT LONG TERM GOAL #4   Title Pt will complete laundry with modified independence including tranporting items to and from laundry room.    Baseline Eval: Daughter performing    Time 12    Period Weeks    Status On-going      OT LONG TERM GOAL #5   Title Pt will improve grip strength bilaterally by 10# to assist with opening jars and containers.    Baseline Eval:  decreased grip    Time 6    Period Weeks    Status On-going    Target  Date 11/11/20      OT LONG TERM GOAL #6   Title Pt will improve ROM and strength to perform hair styling with modified independence    Baseline Eval:  Requires assist    Time 6    Period Weeks    Status Achieved      OT LONG TERM GOAL #7   Title Pt will obtain clothing from closet and drawers in preparation for self care/dressing tasks.    Baseline Eval:  Dtr performing    Time 6    Period Weeks    Status Achieved                 Plan - 10/05/20 1801    Clinical Impression Statement Patient has made good progress in all areas, plan to assess for discharge next session. All AE questions answered and long handled sponge/bidet discussed this date. Pt safely demonstrated reaching into closet and cabinets from sitting/standing and with use of reacher, required MIN cues for body positioning. Continue OT to increase independence in necessary daily tasks.   OT Occupational Profile and History Detailed Assessment- Review of Records and additional review of physical, cognitive, psychosocial history related to current functional performance    Occupational performance deficits (Please refer to evaluation for details): ADL's;IADL's;Leisure;Social Participation    Body Structure / Function / Physical Skills ADL;Dexterity;ROM;Strength;Balance;Coordination;FMC;IADL;UE functional use;GMC;Mobility    Engineer, materials Environmental  Adaptations;Habits;Routines and Behaviors    Rehab Potential Good    Clinical Decision Making Several treatment options, min-mod task modification necessary    Comorbidities Affecting Occupational Performance: May have comorbidities impacting occupational performance    Modification or Assistance to Complete Evaluation  No modification of tasks or assist necessary to complete eval    OT Frequency 2x / week    OT Duration 12 weeks    OT Treatment/Interventions Self-care/ADL training;Cryotherapy;Therapeutic exercise;DME and/or AE  instruction;Functional Mobility Training;Cognitive remediation/compensation;Balance training;Neuromuscular education;Manual Therapy;Moist Heat;Contrast Bath;Passive range of motion;Therapeutic activities;Patient/family education    Consulted and Agree with Plan of Care Patient           Patient will benefit from skilled therapeutic intervention in order to improve the following deficits and impairments:   Body Structure / Function / Physical Skills: ADL,Dexterity,ROM,Strength,Balance,Coordination,FMC,IADL,UE functional use,GMC,Mobility Cognitive Skills: Memory Psychosocial Skills: Environmental  Adaptations,Habits,Routines and Behaviors   Visit Diagnosis: Muscle weakness (generalized)  Other lack of coordination    Problem List Patient  Active Problem List   Diagnosis Date Noted  . TIA (transient ischemic attack) 02/03/2020  . Aphasia   . Acute bronchitis 10/06/2016    Kathie Dike, M.S. OTR/L  10/05/20, 6:11 PM  ascom [phone]  [facility] MAIN [facility] SERVICES [address] [place], [place], [address] Phone: [phone]   Fax:  [phone]  Name: Kara Pennington MRN: [id number] Date of Birth: 09-Dec-1940

## 2020-10-10 ENCOUNTER — Other Ambulatory Visit: Payer: Self-pay

## 2020-10-10 ENCOUNTER — Ambulatory Visit: Payer: Medicare Other | Attending: Physical Medicine and Rehabilitation

## 2020-10-10 ENCOUNTER — Ambulatory Visit: Payer: Medicare Other | Admitting: Occupational Therapy

## 2020-10-10 DIAGNOSIS — R2681 Unsteadiness on feet: Secondary | ICD-10-CM | POA: Insufficient documentation

## 2020-10-10 DIAGNOSIS — R296 Repeated falls: Secondary | ICD-10-CM | POA: Insufficient documentation

## 2020-10-10 DIAGNOSIS — R278 Other lack of coordination: Secondary | ICD-10-CM | POA: Insufficient documentation

## 2020-10-10 DIAGNOSIS — R2689 Other abnormalities of gait and mobility: Secondary | ICD-10-CM | POA: Diagnosis present

## 2020-10-10 DIAGNOSIS — M6281 Muscle weakness (generalized): Secondary | ICD-10-CM | POA: Insufficient documentation

## 2020-10-10 DIAGNOSIS — R269 Unspecified abnormalities of gait and mobility: Secondary | ICD-10-CM | POA: Insufficient documentation

## 2020-10-10 DIAGNOSIS — R262 Difficulty in walking, not elsewhere classified: Secondary | ICD-10-CM | POA: Insufficient documentation

## 2020-10-10 NOTE — Therapy (Signed)
[place] [facility] MAIN [facility] SERVICES [address] [place], [place], [address] Phone: [phone]   Fax:  [phone]  Physical Therapy Treatment  Patient Details  Name: Kara Pennington MRN: [id number] Date of Birth: 1941-05-17 Referring Provider (PT): Dr. Lanny Cramp   Encounter Date: 10/10/2020   PT End of Session - 10/10/20 1334    Visit Number 14    Number of Visits 25    Date for PT Re-Evaluation 11/08/20    Authorization Type eval: 02/08    PT Start Time 1353    PT Stop Time 1430    PT Time Calculation (min) 37 min    Equipment Utilized During Treatment Gait belt    Activity Tolerance Patient tolerated treatment well;Patient limited by fatigue    Behavior During Therapy [facility] for tasks assessed/performed           Past Medical History:  Diagnosis Date  . Anxiety   . Depression   . GERD (gastroesophageal reflux disease)   . Hypertension     Past Surgical History:  Procedure Laterality Date  . APPENDECTOMY    . PARATHYROIDECTOMY      There were no vitals filed for this visit.   Subjective Assessment - 10/10/20 1351    Subjective Pt denies pain today. Pt reports her daughter went home so she is doing more of her own ADLs, but is says it is harder. Pt reports she is using her chair when she's tired and uses her LEs to help propel herself in her chair.    Patient is accompained by: Family member    Pertinent History 80 y.o. female presents to skilled physical therapy with a history of COVID-19 infection 1 year ago, hypothyroidism, and fall on 07/15/2020 with resultant nondisplaced right temporal bone fracture with associated minimal left parietal subarachnoid hemorrhage and a resolved right frontotemporal subdural hematoma who presented to [facility] on 07/23/20 with intermitten but worsening word finding difficulties and expressive aphasia. While in the hospital, she had approximately 24 hours of speaking "in gibberish" and  unable to speak. After about 1 day, it was much improved and she was transferred to [facility]. She normally lives in an independent living apartment. During the week, daughter stated that she was overall doing better though she still would mix up words. On the day of admit, the daughter spoke to her at around noon and she said they had a "normal" conversation. She arrived at [facility] to visit with her at 2:15 and immediately felt that her mother was not well. She was back to speaking non-sensically. Daughter requested return to [facility]. Upon arrival she was able to follow commands and answer some questions accurately, however, she was having considerable word finding difficulties and seemed somewhat frustrated. 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    Limitations Standing;Walking    How long can you sit comfortably? unlimited time    How long can you stand comfortably? less than 5 mins    How long can you walk comfortably? less than 5 mins    Patient Stated Goals "I want to be able to get back to church and rejoin the knitting club"    Currently in Pain? No/denies          TREATMENT   Ambulation in clinic using 4WW: 2x138 ft with 4WW, WC follow, HR 69-70, SPO2% 99%, BORG RPE 5/10 2x138 ft, 4WW, WC follow: HR 58-61, SPO2% 91-96%, BORG  RPE 4/10; seated rest break about half way through second lap.  Stairs - reciprocal pattern ascending, step-to descending, B handrails, CGA 2x, BORG RPE 4/10, HR 60s-70s, SPO2% 91-97%   Hip march- 3x15; pt rates exercise "medium" difficulty  Seated LAQ x 30 reps; VC/demo for technique.  Education provided throughout session in the form of VC/TC and demo to facilitate movement at target joints and improved/correct muscle activation with exercises.    Assessment: Pt session limited today secondary to late start to appointment and pt reporting frustration/feeling upset at start of session, so  beginning of session focused on reviewing pt progress and concerns and goals for PT. Pt expressed and demonstrated motivation after conversation for remainder of session with therex and stair training. The pt requires frequent rest breaks with all therex, where BORG RPE ranged from 4-5/10 with ambulation and pt SPO2% ranged from 91-99%. The pt will benefit from further skilled therapy to improve LE strength and to improve endurance/functional capacity to increase QOL.     PT Education - 10/10/20 1704    Education Details Pt educated on technique with stair negotiation/safe technique    Person(s) Educated Patient    Methods Explanation;Verbal cues;Tactile cues    Comprehension Verbalized understanding;Returned demonstration            PT Short Term Goals - 09/26/20 6553      PT SHORT TERM GOAL #1   Title Patient will be independent in home exercise program to improve strength/mobility for better functional independence with ADLs.    Baseline 09/26/2020- Patient requires cues for Breathing techniques with walking and reminders to use the BORG RPE scale. She is knowledgeable of seated LE strengthening but will continue to benefit from progression of HEP to more progressive exercises.    Time 6    Period Weeks    Status On-going    Target Date 09/27/20             PT Long Term Goals - 09/26/20 1612      PT LONG TERM GOAL #1   Title Patient will increase FOTO score to equal to or greater than to 61 demonstrate statistically significant improvement in mobility and quality of life.    Baseline 02/08: 45    Time 12    Period Weeks    Status New    Target Date 11/08/20      PT LONG TERM GOAL #2   Title Patient (> 12 years old) will complete five times sit to stand test in < 15 seconds indicating an increased LE strength and improved balance.    Baseline 02/08: 19s without BUE support. 09/26/2020= 16.32 sec without UE support.    Time 12    Period Weeks    Status On-going    Target  Date 11/08/20      PT LONG TERM GOAL #3   Title Patient will increase 10 meter walk test to >1.48m/s as to improve gait speed for better community ambulation and to reduce fall risk.    Baseline 02/08:0.37 m/s. 09/26/2020= 0.59 m/s using 4WW.    Time 12    Period Weeks    Status On-going    Target Date 11/08/20      PT LONG TERM GOAL #4   Title Patient will reduce timed up and go to <11 seconds to reduce fall risk and demonstrate improved transfer/gait ability.    Baseline 02/08: 33.43 with 2WW. 09/26/2020= 22.18 sec with 4WW    Time 12  Period Weeks    Status On-going    Target Date 11/08/20      PT LONG TERM GOAL #5   Title Patient will increase ABC scale score >80% to demonstrate better functional mobility and better confidence with ADLs.    Baseline 02/08: 48.25%    Time 12    Period Weeks    Status New      Additional Long Term Goals   Additional Long Term Goals Yes      PT LONG TERM GOAL #6   Title Patient will demonstrate improved 6 min walk test from only able to complete 100 feet in  prior to stoppage from fatigue to completing test at 550 feet or more without rest break, reporting some difficulty or less to improve walking tolerance with community ambulation including walking to dinining hall at independent living facility, grocery shopping, going to church,etc.    Baseline 09/26/2020= 100 feet in 1 min using 4WW- stopped due to fatigue.    Time 6    Status New    Target Date 11/08/20                 Plan - 10/10/20 1706    Clinical Impression Statement Pt session limited today secondary to late start to appointment and pt reporting frustration/feeling upset at start of session, so beginning of session focused on reviewing pt progress and concerns and goals for PT. Pt expressed and demonstrated motivation after conversation for remainder of session with therex and stair training. The pt requires frequent rest breaks with all therex, where BORG RPE ranged  from 4-5/10 with ambulation and pt SPO2% ranged from 91-99%. The pt will benefit from further skilled therapy to improve LE strength and to improve endurance/functional capacity to increase QOL.    Personal Factors and Comorbidities Age;Comorbidity 3+    Comorbidities TIA, aphasia, acute bronchitis    Examination-Activity Limitations Stairs;Squat;Bend;Stand;Transfers;Lift    Examination-Participation Restrictions [employer]    Stability/Clinical Decision Making Evolving/Moderate complexity    Rehab Potential Fair    PT Frequency 2x / week    PT Duration 12 weeks    PT Treatment/Interventions ADLs/Self Care Home Management;Canalith Repostioning;Moist Heat;Traction;Electrical Stimulation;Gait training;Stair training;Functional mobility training;Therapeutic activities;Therapeutic exercise;Balance training;Neuromuscular re-education;Patient/family education;Passive range of motion;Energy conservation    PT Next Visit Plan progress strengthening and balance exercises, progress cardiovascular endurance training; progress standing therex, progress volume of distance amb.    Consulted and Agree with Plan of Care Patient;Family member/caregiver    Family Member Consulted daughter           Patient will benefit from skilled therapeutic intervention in order to improve the following deficits and impairments:  Abnormal gait,Decreased endurance,Cardiopulmonary status limiting activity,Decreased activity tolerance,Decreased strength,Decreased balance,Decreased mobility,Difficulty walking,Improper body mechanics,Decreased safety awareness  Visit Diagnosis: Muscle weakness (generalized)  Other abnormalities of gait and mobility  Unsteadiness on feet     Problem List Patient Active Problem List   Diagnosis Date Noted  . TIA (transient ischemic attack) 02/03/2020  . Aphasia   . Acute bronchitis 10/06/2016   Temple Pacini PT, DPT 10/10/2020, 5:10 PM  [place] [facility] MAIN  [facility] SERVICES [address] [place], [place], [address] Phone: [phone]   Fax:  [phone]  Name: Kara Pennington MRN: [id number] Date of Birth: Feb 16, 1941

## 2020-10-11 ENCOUNTER — Encounter: Payer: Self-pay | Admitting: Occupational Therapy

## 2020-10-11 NOTE — Therapy (Signed)
[place] MAIN [facility] SERVICES [address] [place], [place], [address] Phone: [phone]   Fax:  [phone]  Occupational Therapy Treatment  Patient Details  Name: Kara Pennington MRN: [id number] Date of Birth: Aug 13, 1940 No data recorded  Encounter Date: 10/10/2020   OT End of Session - 10/11/20 1936    Visit Number 9    Number of Visits 24    Date for OT Re-Evaluation 11/11/20    OT Start Time 1303    OT Stop Time 1345    OT Time Calculation (min) 42 min    Activity Tolerance Patient tolerated treatment well    Behavior During Therapy [facility] for tasks assessed/performed           Past Medical History:  Diagnosis Date  . Anxiety   . Depression   . GERD (gastroesophageal reflux disease)   . Hypertension     Past Surgical History:  Procedure Laterality Date  . APPENDECTOMY    . PARATHYROIDECTOMY      There were no vitals filed for this visit.   Subjective Assessment - 10/11/20 1934    Subjective  Pt reports her daughter went home, another daughter brought her for therapy today, they report she did well over the weekend by herself.  Reports independence with basic self care tasks but did have some difficulty with using the sock aid today.  Also still has difficulty with carrying items such as a coffee cup from one place to the next.    Pertinent History Pt is an 80 year old female with a history of COVID-19 infection 1 year ago, hypothyroidism, and recent fall downstairs on 07/15/2020. Pt with resultant Trace L parietal SAH, Trace R parietal, L frontal SAH and R frontoparietal SDH. Pt discharged from Inpatient rehab on Thursday August 11, 2020.    Patient Stated Goals Pt would like to get back to driving, be as independent as she was before.    Currently in Pain? No/denies    Multiple Pain Sites No            Discussed with patient and daughter difficulties patient still has with select ADL and IADL tasks at home that are barriers  to being independent.    Pt reported difficulty with use of Sock aid this date to don her socks.  Reinstructed on use of sock aid in the clinic this date with a sock aid identical to the one she has at home.  Pt instructed on tips for use and was able to demonstrate with modified independence after several trials.  She does demonstrate some difficulty with placing the sock onto the aid and benefitted from instruction with use of rubber gloves to help hold and stabilize the sock while pulling down onto the aid, issued a pair for home use or she can consider a set of playtex gloves for home for long term.   Pt instructed on use of Walker tray in the clinic, how to place onto walker, size and fit, use for being able to transport items from one area of her room to the other.  Issued written information and ordering info from [employer] for purchase.   Pt also reports difficulty with carrying Mug for coffee, instructed on use for walker tray but also recommended lidded coffee mug that will work with her keurig coffee maker.    Discussed Discharge from OT, goals, HEP and follow up with adaptive equipment.  She demonstrates understanding.    Response to tx: Pt  now modified independent with basic self care tasks, she has difficulty with select IADL tasks and now has recommendations and instruction regarding improving tasks to be able to complete with modified independence.  Pt reports she will plan to continue with PT for balance and mobility skills.  Pt will continue to have assist with her laundry to get clothes to and from the laundry area, able to fold and hang once they are laundered. Goals met and no further OT needs at this time.  Discharge from OT services.                       OT Education - 10/11/20 1936    Education Details use of sock aid, walker tray and a lidded coffee mug    Person(s) Educated Patient;Child(ren)    Methods Explanation;Demonstration;Handout    Comprehension  Verbalized understanding;Returned demonstration               OT Long Term Goals - 10/12/20 1913      OT LONG TERM GOAL #1   Title Pt will perform home exercise program with modified independence.    Baseline eval: no current program    Time 12    Period Weeks    Status Achieved      OT LONG TERM GOAL #2   Title Pt will complete dressing skills with modified independence with no rest breaks and in 20 mins or less.    Baseline eval:  increased time allowed, distant supervision    Time 6    Period Weeks    Status Achieved      OT LONG TERM GOAL #3   Title Pt will complete light homemaking skills with modified independence.    Baseline Eval:  daughter helping currently    Time 62    Period Weeks    Status Achieved      OT LONG TERM GOAL #4   Title Pt will complete laundry with modified independence including tranporting items to and from laundry room.    Baseline Eval: Daughter performing    Time 12    Period Weeks    Status Partially Met      OT LONG TERM GOAL #5   Title Pt will improve grip strength bilaterally by 10# to assist with opening jars and containers.    Baseline Eval:  decreased grip    Time 6    Period Weeks    Status Achieved      OT LONG TERM GOAL #6   Title Pt will improve ROM and strength to perform hair styling with modified independence    Baseline Eval:  Requires assist    Time 6    Period Weeks    Status Achieved      OT LONG TERM GOAL #7   Title Pt will obtain clothing from closet and drawers in preparation for self care/dressing tasks.    Baseline Eval:  Dtr performing    Time 6    Period Weeks    Status Achieved                 Plan - 10/11/20 1937    Clinical Impression Statement Pt now modified independent with basic self care tasks, she has difficulty with select IADL tasks and now has recommendations and instruction regarding improving tasks to be able to complete with modified independence.  Pt reports she will plan to  continue with PT for balance and mobility skills.  Pt will  continue to have assist with her laundry to get clothes to and from the laundry area, able to fold and hang once they are laundered. Goals met and no further OT needs at this time.  Discharge from OT services.    OT Occupational Profile and History Detailed Assessment- Review of Records and additional review of physical, cognitive, psychosocial history related to current functional performance    Occupational performance deficits (Please refer to evaluation for details): ADL's;IADL's;Leisure;Social Participation    Body Structure / Function / Physical Skills ADL;Dexterity;ROM;Strength;Balance;Coordination;FMC;IADL;UE functional use;GMC;Mobility    Multimedia programmer Environmental  Adaptations;Habits;Routines and Behaviors    Rehab Potential Good    Clinical Decision Making Several treatment options, min-mod task modification necessary    Comorbidities Affecting Occupational Performance: May have comorbidities impacting occupational performance    Modification or Assistance to Complete Evaluation  No modification of tasks or assist necessary to complete eval    OT Frequency 2x / week    OT Duration 12 weeks    OT Treatment/Interventions Self-care/ADL training;Cryotherapy;Therapeutic exercise;DME and/or AE instruction;Functional Mobility Training;Cognitive remediation/compensation;Balance training;Neuromuscular education;Manual Therapy;Moist Heat;Contrast Bath;Passive range of motion;Therapeutic activities;Patient/family education    Consulted and Agree with Plan of Care Patient           Patient will benefit from skilled therapeutic intervention in order to improve the following deficits and impairments:   Body Structure / Function / Physical Skills: ADL,Dexterity,ROM,Strength,Balance,Coordination,FMC,IADL,UE functional use,GMC,Mobility Cognitive Skills: Memory Psychosocial Skills: Environmental   Adaptations,Habits,Routines and Behaviors   Visit Diagnosis: Muscle weakness (generalized)  Other lack of coordination  Unsteadiness on feet    Problem List Patient Active Problem List   Diagnosis Date Noted  . TIA (transient ischemic attack) 02/03/2020  . Aphasia   . Acute bronchitis 10/06/2016   Nataline Basara T Tomasita Morrow, OTR/L, CLT  Kazi Montoro 10/12/2020, 7:15 PM  [place] MAIN [facility] SERVICES [address] [place], [place], [address] Phone: [phone]   Fax:  [phone]  Name: Kara Pennington MRN: [id number] Date of Birth: 1940/10/26

## 2020-10-12 ENCOUNTER — Ambulatory Visit: Payer: Medicare Other | Admitting: Occupational Therapy

## 2020-10-12 ENCOUNTER — Ambulatory Visit: Payer: Medicare Other

## 2020-10-17 ENCOUNTER — Other Ambulatory Visit: Payer: Self-pay

## 2020-10-17 ENCOUNTER — Ambulatory Visit: Payer: Medicare Other

## 2020-10-17 DIAGNOSIS — M6281 Muscle weakness (generalized): Secondary | ICD-10-CM | POA: Diagnosis not present

## 2020-10-17 DIAGNOSIS — R2681 Unsteadiness on feet: Secondary | ICD-10-CM

## 2020-10-17 DIAGNOSIS — R296 Repeated falls: Secondary | ICD-10-CM

## 2020-10-17 DIAGNOSIS — R2689 Other abnormalities of gait and mobility: Secondary | ICD-10-CM

## 2020-10-17 DIAGNOSIS — R278 Other lack of coordination: Secondary | ICD-10-CM

## 2020-10-17 NOTE — Therapy (Signed)
[place] [facility] MAIN [facility] SERVICES [address] [place], [place], [address] Phone: [phone]   Fax:  [phone]  Physical Therapy Treatment  Patient Details  Name: Kara Pennington MRN: [id number] Date of Birth: 13-Aug-1940 Referring Provider (PT): Dr. Lanny Cramp   Encounter Date: 10/17/2020   PT End of Session - 10/17/20 1609    Visit Number 15    Number of Visits 25    Date for PT Re-Evaluation 11/08/20    Authorization Type eval: 02/08    PT Start Time 1601    PT Stop Time 1646    PT Time Calculation (min) 45 min    Equipment Utilized During Treatment Gait belt    Activity Tolerance Patient tolerated treatment well;Patient limited by fatigue    Behavior During Therapy [facility] for tasks assessed/performed           Past Medical History:  Diagnosis Date  . Anxiety   . Depression   . GERD (gastroesophageal reflux disease)   . Hypertension     Past Surgical History:  Procedure Laterality Date  . APPENDECTOMY    . PARATHYROIDECTOMY      There were no vitals filed for this visit.   Subjective Assessment - 10/17/20 1607    Subjective Patient reports she realized she was more dependent on her daughters than she realized. Reports she is walking to dining room. Reports she saw her neurologist who is recommending an MRI.    Patient is accompained by: Family member    Pertinent History 80 y.o. female presents to skilled physical therapy with a history of COVID-19 infection 1 year ago, hypothyroidism, and fall on 07/15/2020 with resultant nondisplaced right temporal bone fracture with associated minimal left parietal subarachnoid hemorrhage and a resolved right frontotemporal subdural hematoma who presented to [facility] hospital on 07/23/20 with intermitten but worsening word finding difficulties and expressive aphasia. While in the hospital, she had approximately 24 hours of speaking "in gibberish" and unable to speak. After about 1 day, it was  much improved and she was transferred to [facility] and Rehab. She normally lives in an independent living apartment. During the week, daughter stated that she was overall doing better though she still would mix up words. On the day of admit, the daughter spoke to her at around noon and she said they had a "normal" conversation. She arrived at [facility] to visit with her at 2:15 and immediately felt that her mother was not well. She was back to speaking non-sensically. Daughter requested return to [facility]. Upon arrival she was able to follow commands and answer some questions accurately, however, she was having considerable word finding difficulties and seemed somewhat frustrated. 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    Limitations Standing;Walking    How long can you sit comfortably? unlimited time    How long can you stand comfortably? less than 5 mins    How long can you walk comfortably? less than 5 mins    Patient Stated Goals "I want to be able to get back to church and rejoin the knitting club"    Currently in Pain? No/denies           O2 sat= 97% on room air at rest HR= 54 bpm.   Ambulation in clinic using 4WW: 286 ft with 4WW, WC follow, HR 69 bpm, SPO2%=98%, BORG RPE 5/10- "challenging"  2 min step test for exercise-  completed 29  Steps and  able to complete test alt  LE. Patient rated at "6/10"  Precor leg press- 25lb. 2 sets of 10 reps BLE- patient rates as "Hard"  Ambulation: 90 feet ambulation after leg press- using 4WW = patient stopped due to undo fatigue- Reports as "hard' O2 sat= 98% BORG= 6/10  Sit to stand without UE support from seat of 4WW (2 sets of 10 reps)- "easier than it has been but still a 5/10"   Education provided throughout session in the form of VC/TC and demo to facilitate movement at target joints and improved/correct muscle activation with exercises.     Clinical Impression: Patient continues  to respond favorably with more functional endurance based activities. She was able to progress her gait endurance and add some resistive training including leg press without significant concerns including no pain or SOB. She is able to use BORG scale with minimal VC. The pt will benefit from further skilled therapy to improve LE strength and to improve endurance/functional capacity to increase QOL.                           PT Education - 10/17/20 1717    Education Details Review of BORG scale; exercise techniques.    Person(s) Educated Patient    Methods Explanation;Demonstration;Verbal cues    Comprehension Verbalized understanding;Verbal cues required;Need further instruction;Returned demonstration            PT Short Term Goals - 09/26/20 0852      PT SHORT TERM GOAL #1   Title Patient will be independent in home exercise program to improve strength/mobility for better functional independence with ADLs.    Baseline 09/26/2020- Patient requires cues for Breathing techniques with walking and reminders to use the BORG RPE scale. She is knowledgeable of seated LE strengthening but will continue to benefit from progression of HEP to more progressive exercises.    Time 6    Period Weeks    Status On-going    Target Date 09/27/20             PT Long Term Goals - 09/26/20 1612      PT LONG TERM GOAL #1   Title Patient will increase FOTO score to equal to or greater than to 61 demonstrate statistically significant improvement in mobility and quality of life.    Baseline 02/08: 45    Time 12    Period Weeks    Status New    Target Date 11/08/20      PT LONG TERM GOAL #2   Title Patient (> 81 years old) will complete five times sit to stand test in < 15 seconds indicating an increased LE strength and improved balance.    Baseline 02/08: 19s without BUE support. 09/26/2020= 16.32 sec without UE support.    Time 12    Period Weeks    Status On-going    Target  Date 11/08/20      PT LONG TERM GOAL #3   Title Patient will increase 10 meter walk test to >1.63m/s as to improve gait speed for better community ambulation and to reduce fall risk.    Baseline 02/08:0.37 m/s. 09/26/2020= 0.59 m/s using 4WW.    Time 12    Period Weeks    Status On-going    Target Date 11/08/20      PT LONG TERM GOAL #4   Title Patient will reduce timed up and go to <11 seconds to reduce fall risk  and demonstrate improved transfer/gait ability.    Baseline 02/08: 33.43 with 2WW. 09/26/2020= 22.18 sec with 4WW    Time 12    Period Weeks    Status On-going    Target Date 11/08/20      PT LONG TERM GOAL #5   Title Patient will increase ABC scale score >80% to demonstrate better functional mobility and better confidence with ADLs.    Baseline 02/08: 48.25%    Time 12    Period Weeks    Status New      Additional Long Term Goals   Additional Long Term Goals Yes      PT LONG TERM GOAL #6   Title Patient will demonstrate improved 6 min walk test from only able to complete 100 feet in  prior to stoppage from fatigue to completing test at 550 feet or more without rest break, reporting some difficulty or less to improve walking tolerance with community ambulation including walking to dinining hall at independent living facility, grocery shopping, going to church,etc.    Baseline 09/26/2020= 100 feet in 1 min using 4WW- stopped due to fatigue.    Time 6    Status New    Target Date 11/08/20                 Plan - 10/17/20 1609    Clinical Impression Statement Patient continues to respond favorably with more functional endurance based activities. She was able to progress her gait endurance and add some resistive training including leg press without significant concerns including no pain or SOB. She is able to use BORG scale with minimal VC. The pt will benefit from further skilled therapy to improve LE strength and to improve endurance/functional capacity to  increase QOL    Personal Factors and Comorbidities Age;Comorbidity 3+    Comorbidities TIA, aphasia, acute bronchitis    Examination-Activity Limitations Stairs;Squat;Bend;Stand;Transfers;Lift    Examination-Participation Restrictions Church    Stability/Clinical Decision Making Evolving/Moderate complexity    Rehab Potential Fair    PT Frequency 2x / week    PT Duration 12 weeks    PT Treatment/Interventions ADLs/Self Care Home Management;Canalith Repostioning;Moist Heat;Traction;Electrical Stimulation;Gait training;Stair training;Functional mobility training;Therapeutic activities;Therapeutic exercise;Balance training;Neuromuscular re-education;Patient/family education;Passive range of motion;Energy conservation    PT Next Visit Plan progress strengthening and balance exercises, progress cardiovascular endurance training; progress standing therex, progress volume of distance amb.    Consulted and Agree with Plan of Care Patient;Family member/caregiver    Family Member Consulted daughter           Patient will benefit from skilled therapeutic intervention in order to improve the following deficits and impairments:  Abnormal gait,Decreased endurance,Cardiopulmonary status limiting activity,Decreased activity tolerance,Decreased strength,Decreased balance,Decreased mobility,Difficulty walking,Improper body mechanics,Decreased safety awareness  Visit Diagnosis: Muscle weakness (generalized)  Other lack of coordination  Unsteadiness on feet  Other abnormalities of gait and mobility  Repeated falls     Problem List Patient Active Problem List   Diagnosis Date Noted  . TIA (transient ischemic attack) 02/03/2020  . Aphasia   . Acute bronchitis 10/06/2016    Lenda Kelp, PT 10/17/2020, 5:25 PM  [place] [facility] MAIN [facility] SERVICES [address] [place], [place], [address] Phone: [phone]   Fax:  [phone]  Name: ANYAE GRIFFITH MRN: [id number] Date of Birth: 08/31/1940

## 2020-10-19 ENCOUNTER — Other Ambulatory Visit: Payer: Self-pay

## 2020-10-19 ENCOUNTER — Encounter: Payer: Medicare Other | Admitting: Occupational Therapy

## 2020-10-19 ENCOUNTER — Ambulatory Visit: Payer: Medicare Other

## 2020-10-19 DIAGNOSIS — R262 Difficulty in walking, not elsewhere classified: Secondary | ICD-10-CM

## 2020-10-19 DIAGNOSIS — M6281 Muscle weakness (generalized): Secondary | ICD-10-CM | POA: Diagnosis not present

## 2020-10-19 DIAGNOSIS — R2689 Other abnormalities of gait and mobility: Secondary | ICD-10-CM

## 2020-10-19 DIAGNOSIS — R278 Other lack of coordination: Secondary | ICD-10-CM

## 2020-10-19 DIAGNOSIS — R269 Unspecified abnormalities of gait and mobility: Secondary | ICD-10-CM

## 2020-10-19 NOTE — Therapy (Signed)
[facility] MAIN [facility] SERVICES [address] [place], [place], [address] Phone: [phone]   Fax:  [phone]  Physical Therapy Treatment  Patient Details  Name: Kara Pennington MRN: [id number] Date of Birth: Jun 16, 1941 Referring Provider (PT): Dr. Lanny Cramp   Encounter Date: 10/19/2020   PT End of Session - 10/19/20 1522    Visit Number 16    Number of Visits 25    Date for PT Re-Evaluation 11/08/20    Authorization Type eval: 02/08    PT Start Time 1515    PT Stop Time 1559    PT Time Calculation (min) 44 min    Equipment Utilized During Treatment Gait belt    Activity Tolerance Patient tolerated treatment well;Patient limited by fatigue    Behavior During Therapy [facility] for tasks assessed/performed           Past Medical History:  Diagnosis Date  . Anxiety   . Depression   . GERD (gastroesophageal reflux disease)   . Hypertension     Past Surgical History:  Procedure Laterality Date  . APPENDECTOMY    . PARATHYROIDECTOMY      There were no vitals filed for this visit.   Subjective Assessment - 10/19/20 1521    Subjective Patient reports being tired today but agreeable to session.    Patient is accompained by: Family member    Pertinent History 80 y.o. female presents to skilled physical therapy with a history of COVID-19 infection 1 year ago, hypothyroidism, and fall on 07/15/2020 with resultant nondisplaced right temporal bone fracture with associated minimal left parietal subarachnoid hemorrhage and a resolved right frontotemporal subdural hematoma who presented to [facility] hospital on 07/23/20 with intermitten but worsening word finding difficulties and expressive aphasia. While in the hospital, she had approximately 24 hours of speaking "in gibberish" and unable to speak. After about 1 day, it was much improved and she was transferred to [facility] and Rehab. She normally lives in an independent living apartment.  During the week, daughter stated that she was overall doing better though she still would mix up words. On the day of admit, the daughter spoke to her at around noon and she said they had a "normal" conversation. She arrived at [facility] to visit with her at 2:15 and immediately felt that her mother was not well. She was back to speaking non-sensically. Daughter requested return to [facility]. Upon arrival she was able to follow commands and answer some questions accurately, however, she was having considerable word finding difficulties and seemed somewhat frustrated. 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    Limitations Standing;Walking    How long can you sit comfortably? unlimited time    How long can you stand comfortably? less than 5 mins    How long can you walk comfortably? less than 5 mins    Patient Stated Goals "I want to be able to get back to church and rejoin the knitting club"    Currently in Pain? No/denies                O2 sat= 97% on room air at rest HR= 56 bpm.  Nustep L3 LE only x 5 min -VC to maintain > 60 spm  Ambulation in clinic using 4WW: 290 ft with 4WW,  HR 67 bpm, SPO2%=98%, BORG RPE 5/10- "challenging" Patient begins amb with hands on handle of walker but on 2nd lap she utilizes more weight bearing  through her forearms with more forward flexed posture.   2 min step test for exercise-  completed 32  Steps and able to complete test alt  LE. Patient rated at "6/10"  Precor leg press- 25lb. 2 sets of 10 reps BLE- patient rates as "Hard"- 7/10 with mild difficulty obtaining position.    Sit to stand without UE support from seat of 4WW (2 sets of 10 reps)-  5/10"   Education provided throughout session in the form of VC/TC and demo to facilitate movement at target joints and improved/correct muscle activation with exercises.     Clinical Impression:  Patient continues to exhibit improving functional endurance  with activities and responds well to cues and use of BORG scale. The pt will benefit from further skilled therapy to improve LE strength and to improve endurance/functional capacity to increase QOL.                              PT Education - 10/20/20 2001    Education Details specific exercise form    Person(s) Educated Patient    Methods Explanation;Demonstration;Verbal cues    Comprehension Verbalized understanding;Verbal cues required;Need further instruction;Returned demonstration            PT Short Term Goals - 09/26/20 0852      PT SHORT TERM GOAL #1   Title Patient will be independent in home exercise program to improve strength/mobility for better functional independence with ADLs.    Baseline 09/26/2020- Patient requires cues for Breathing techniques with walking and reminders to use the BORG RPE scale. She is knowledgeable of seated LE strengthening but will continue to benefit from progression of HEP to more progressive exercises.    Time 6    Period Weeks    Status On-going    Target Date 09/27/20             PT Long Term Goals - 09/26/20 1612      PT LONG TERM GOAL #1   Title Patient will increase FOTO score to equal to or greater than to 61 demonstrate statistically significant improvement in mobility and quality of life.    Baseline 02/08: 45    Time 12    Period Weeks    Status New    Target Date 11/08/20      PT LONG TERM GOAL #2   Title Patient (> 9 years old) will complete five times sit to stand test in < 15 seconds indicating an increased LE strength and improved balance.    Baseline 02/08: 19s without BUE support. 09/26/2020= 16.32 sec without UE support.    Time 12    Period Weeks    Status On-going    Target Date 11/08/20      PT LONG TERM GOAL #3   Title Patient will increase 10 meter walk test to >1.44m/s as to improve gait speed for better community ambulation and to reduce fall risk.    Baseline 02/08:0.37 m/s.  09/26/2020= 0.59 m/s using 4WW.    Time 12    Period Weeks    Status On-going    Target Date 11/08/20      PT LONG TERM GOAL #4   Title Patient will reduce timed up and go to <11 seconds to reduce fall risk and demonstrate improved transfer/gait ability.    Baseline 02/08: 33.43 with 2WW. 09/26/2020= 22.18 sec with 4WW    Time 12    Period  Weeks    Status On-going    Target Date 11/08/20      PT LONG TERM GOAL #5   Title Patient will increase ABC scale score >80% to demonstrate better functional mobility and better confidence with ADLs.    Baseline 02/08: 48.25%    Time 12    Period Weeks    Status New      Additional Long Term Goals   Additional Long Term Goals Yes      PT LONG TERM GOAL #6   Title Patient will demonstrate improved 6 min walk test from only able to complete 100 feet in  prior to stoppage from fatigue to completing test at 550 feet or more without rest break, reporting some difficulty or less to improve walking tolerance with community ambulation including walking to dinining hall at independent living facility, grocery shopping, going to church,etc.    Baseline 09/26/2020= 100 feet in 1 min using 4WW- stopped due to fatigue.    Time 6    Status New    Target Date 11/08/20                 Plan - 10/19/20 1522    Clinical Impression Statement Patient continues to exhibit improving functional endurance with activities and responds well to cues and use of BORG scale. The pt will benefit from further skilled therapy to improve LE strength and to improve endurance/functional capacity to increase QOL.    Personal Factors and Comorbidities Age;Comorbidity 3+    Comorbidities TIA, aphasia, acute bronchitis    Examination-Activity Limitations Stairs;Squat;Bend;Stand;Transfers;Lift    Examination-Participation Restrictions Church    Stability/Clinical Decision Making Evolving/Moderate complexity    Rehab Potential Fair    PT Frequency 2x / week    PT  Duration 12 weeks    PT Treatment/Interventions ADLs/Self Care Home Management;Canalith Repostioning;Moist Heat;Traction;Electrical Stimulation;Gait training;Stair training;Functional mobility training;Therapeutic activities;Therapeutic exercise;Balance training;Neuromuscular re-education;Patient/family education;Passive range of motion;Energy conservation    PT Next Visit Plan progress strengthening and balance exercises, progress cardiovascular endurance training; progress standing therex, progress volume of distance amb.    Consulted and Agree with Plan of Care Patient;Family member/caregiver    Family Member Consulted daughter           Patient will benefit from skilled therapeutic intervention in order to improve the following deficits and impairments:  Abnormal gait,Decreased endurance,Cardiopulmonary status limiting activity,Decreased activity tolerance,Decreased strength,Decreased balance,Decreased mobility,Difficulty walking,Improper body mechanics,Decreased safety awareness  Visit Diagnosis: Abnormality of gait and mobility  Difficulty in walking, not elsewhere classified  Muscle weakness (generalized)  Other abnormalities of gait and mobility  Other lack of coordination     Problem List Patient Active Problem List   Diagnosis Date Noted  . TIA (transient ischemic attack) 02/03/2020  . Aphasia   . Acute bronchitis 10/06/2016    Lenda Kelp, PT 10/20/2020, 9:22 PM  [place] [facility] MAIN [facility] SERVICES [address] [place], [place], [address] Phone: [phone]   Fax:  [phone]  Name: Kara Pennington MRN: [id number] Date of Birth: 02/12/1941

## 2020-10-25 ENCOUNTER — Encounter: Payer: Medicare Other | Admitting: Occupational Therapy

## 2020-10-25 ENCOUNTER — Other Ambulatory Visit: Payer: Self-pay

## 2020-10-25 ENCOUNTER — Ambulatory Visit: Payer: Medicare Other

## 2020-10-25 DIAGNOSIS — R278 Other lack of coordination: Secondary | ICD-10-CM

## 2020-10-25 DIAGNOSIS — R269 Unspecified abnormalities of gait and mobility: Secondary | ICD-10-CM

## 2020-10-25 DIAGNOSIS — R262 Difficulty in walking, not elsewhere classified: Secondary | ICD-10-CM

## 2020-10-25 DIAGNOSIS — M6281 Muscle weakness (generalized): Secondary | ICD-10-CM | POA: Diagnosis not present

## 2020-10-25 NOTE — Therapy (Signed)
[place] [facility] MAIN [facility] SERVICES [address] [place], [place], [address] Phone: [phone]   Fax:  [phone]  Physical Therapy Treatment  Patient Details  Name: Kara Pennington MRN: [id number] Date of Birth: 1940-09-17 Referring Provider (PT): Dr. Lanny Cramp   Encounter Date: 10/25/2020   PT End of Session - 10/25/20 1452    Visit Number 17    Number of Visits 25    Date for PT Re-Evaluation 11/08/20    Authorization Type eval: 02/08    PT Start Time 1445    PT Stop Time 1528    PT Time Calculation (min) 43 min    Equipment Utilized During Treatment Gait belt    Activity Tolerance Patient tolerated treatment well;Patient limited by fatigue    Behavior During Therapy [facility] for tasks assessed/performed           Past Medical History:  Diagnosis Date  . Anxiety   . Depression   . GERD (gastroesophageal reflux disease)   . Hypertension     Past Surgical History:  Procedure Laterality Date  . APPENDECTOMY    . PARATHYROIDECTOMY      There were no vitals filed for this visit.   Subjective Assessment - 10/25/20 1450    Subjective Patient to report "tired" as main complaint. Patient reports she is now consistently walking down to dining room but not always walking back. Reports today is the first day she has taken the [facility] for transport.    Patient is accompained by: Family member    Pertinent History 80 y.o. female presents to skilled physical therapy with a history of COVID-19 infection 1 year ago, hypothyroidism, and fall on 07/15/2020 with resultant nondisplaced right temporal bone fracture with associated minimal left parietal subarachnoid hemorrhage and a resolved right frontotemporal subdural hematoma who presented to [facility] hospital on 07/23/20 with intermitten but worsening word finding difficulties and expressive aphasia. While in the hospital, she had approximately 24 hours of speaking "in gibberish" and unable  to speak. After about 1 day, it was much improved and she was transferred to [facility]. She normally lives in an independent living apartment. During the week, daughter stated that she was overall doing better though she still would mix up words. On the day of admit, the daughter spoke to her at around noon and she said they had a "normal" conversation. She arrived at [facility] to visit with her at 2:15 and immediately felt that her mother was not well. She was back to speaking non-sensically. Daughter requested return to [facility]. Upon arrival she was able to follow commands and answer some questions accurately, however, she was having considerable word finding difficulties and seemed somewhat frustrated. 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    Limitations Standing;Walking    How long can you sit comfortably? unlimited time    How long can you stand comfortably? less than 5 mins    How long can you walk comfortably? less than 5 mins    Patient Stated Goals "I want to be able to get back to church and rejoin the knitting club"    Currently in Pain? No/denies           Nustep LE only x 5 min -VC to maintain > 60 spm (3 min at Level 3 and 2 min at level 0 as patient reported increased fatigue.  O2 sat= 97% on room air at restHR= 59  bpm.   Ambulation in clinic using 4WW: 275 ft with 4WW,  HR 72 bpm, SPO2%=94%, BORG RPE 5/10- "challenging" Patient begins amb with hands on handle of walker but on 2nd lap she utilizes more weight bearing through her forearms with more forward flexed posture.  2nd trial = 200 feet using 4WW- only able to stand uright and push through her hands for first 100 feet then resorts to more forearm support.   Patient performed seated hip flex/abd over cone x 12 reps each LE- rated as 6/10 on BORG  Patient reported increased fatigue after performing above therex and ended with the following:  Review pursed lip  breathing with patient today.  Seated LAQ- BLE x 15 reps - rates at 4/10 on Borg   Education provided throughout session in the form of VC/TC and demo to facilitate movement at target joints and improved/correct muscle activation with exercises.     Clinical Impression:  Patient fatigued overall today as she continues to report tired as main complaint. She did improve overall with gait distance for improved functional endurance yet was more limited after resulting in more seated exercises. She will benefit from further skilled therapy to improve LE strength and to improve endurance/functional capacity to increase QOL.                                          PT Education - 10/26/20 1248    Education Details specific exercise form    Person(s) Educated Patient    Methods Explanation;Demonstration;Tactile cues;Verbal cues    Comprehension Verbalized understanding;Verbal cues required;Need further instruction;Returned demonstration;Tactile cues required            PT Short Term Goals - 09/26/20 1740      PT SHORT TERM GOAL #1   Title Patient will be independent in home exercise program to improve strength/mobility for better functional independence with ADLs.    Baseline 09/26/2020- Patient requires cues for Breathing techniques with walking and reminders to use the BORG RPE scale. She is knowledgeable of seated LE strengthening but will continue to benefit from progression of HEP to more progressive exercises.    Time 6    Period Weeks    Status On-going    Target Date 09/27/20             PT Long Term Goals - 09/26/20 1612      PT LONG TERM GOAL #1   Title Patient will increase FOTO score to equal to or greater than to 61 demonstrate statistically significant improvement in mobility and quality of life.    Baseline 02/08: 45    Time 12    Period Weeks    Status New    Target Date 11/08/20      PT LONG TERM GOAL #2   Title Patient (> 87  years old) will complete five times sit to stand test in < 15 seconds indicating an increased LE strength and improved balance.    Baseline 02/08: 19s without BUE support. 09/26/2020= 16.32 sec without UE support.    Time 12    Period Weeks    Status On-going    Target Date 11/08/20      PT LONG TERM GOAL #3   Title Patient will increase 10 meter walk test to >1.5m/s as to improve gait speed for better community ambulation and to reduce fall risk.  Baseline 02/08:0.37 m/s. 09/26/2020= 0.59 m/s using 4WW.    Time 12    Period Weeks    Status On-going    Target Date 11/08/20      PT LONG TERM GOAL #4   Title Patient will reduce timed up and go to <11 seconds to reduce fall risk and demonstrate improved transfer/gait ability.    Baseline 02/08: 33.43 with 2WW. 09/26/2020= 22.18 sec with 4WW    Time 12    Period Weeks    Status On-going    Target Date 11/08/20      PT LONG TERM GOAL #5   Title Patient will increase ABC scale score >80% to demonstrate better functional mobility and better confidence with ADLs.    Baseline 02/08: 48.25%    Time 12    Period Weeks    Status New      Additional Long Term Goals   Additional Long Term Goals Yes      PT LONG TERM GOAL #6   Title Patient will demonstrate improved 6 min walk test from only able to complete 100 feet in  prior to stoppage from fatigue to completing test at 550 feet or more without rest break, reporting some difficulty or less to improve walking tolerance with community ambulation including walking to dinining hall at independent living facility, grocery shopping, going to church,etc.    Baseline 09/26/2020= 100 feet in 1 min using 4WW- stopped due to fatigue.    Time 6    Status New    Target Date 11/08/20                 Plan - 10/25/20 1453    Clinical Impression Statement Patient fatigued overall today as she continues to report tired as main complaint. She did improve overall with gait distance for improved  functional endurance yet was more limited after resulting in more seated exercises. She will benefit from further skilled therapy to improve LE strength and to improve endurance/functional capacity to increase QOL.    Personal Factors and Comorbidities Age;Comorbidity 3+    Comorbidities TIA, aphasia, acute bronchitis    Examination-Activity Limitations Stairs;Squat;Bend;Stand;Transfers;Lift    Examination-Participation Restrictions Church    Stability/Clinical Decision Making Evolving/Moderate complexity    Rehab Potential Fair    PT Frequency 2x / week    PT Duration 12 weeks    PT Treatment/Interventions ADLs/Self Care Home Management;Canalith Repostioning;Moist Heat;Traction;Electrical Stimulation;Gait training;Stair training;Functional mobility training;Therapeutic activities;Therapeutic exercise;Balance training;Neuromuscular re-education;Patient/family education;Passive range of motion;Energy conservation    PT Next Visit Plan progress strengthening and balance exercises, progress cardiovascular endurance training; progress standing therex, progress volume of distance amb.    Consulted and Agree with Plan of Care Patient;Family member/caregiver    Family Member Consulted daughter           Patient will benefit from skilled therapeutic intervention in order to improve the following deficits and impairments:  Abnormal gait,Decreased endurance,Cardiopulmonary status limiting activity,Decreased activity tolerance,Decreased strength,Decreased balance,Decreased mobility,Difficulty walking,Improper body mechanics,Decreased safety awareness  Visit Diagnosis: Abnormality of gait and mobility  Difficulty in walking, not elsewhere classified  Muscle weakness (generalized)  Other lack of coordination     Problem List Patient Active Problem List   Diagnosis Date Noted  . TIA (transient ischemic attack) 02/03/2020  . Aphasia   . Acute bronchitis 10/06/2016    Lenda Kelp,  PT 10/26/2020, 12:59 PM  [place] [facility] MAIN [facility] SERVICES [address] [place], [place], [address] Phone:  [phone]   Fax:  [phone]  Name: Kara Pennington MRN: [id number] Date of Birth: February 14, 1941

## 2020-10-27 ENCOUNTER — Ambulatory Visit: Payer: Medicare Other

## 2020-10-27 ENCOUNTER — Encounter: Payer: Medicare Other | Admitting: Occupational Therapy

## 2020-10-27 ENCOUNTER — Other Ambulatory Visit: Payer: Self-pay

## 2020-10-27 DIAGNOSIS — R269 Unspecified abnormalities of gait and mobility: Secondary | ICD-10-CM

## 2020-10-27 DIAGNOSIS — R278 Other lack of coordination: Secondary | ICD-10-CM

## 2020-10-27 DIAGNOSIS — R262 Difficulty in walking, not elsewhere classified: Secondary | ICD-10-CM

## 2020-10-27 DIAGNOSIS — M6281 Muscle weakness (generalized): Secondary | ICD-10-CM

## 2020-10-27 NOTE — Therapy (Signed)
[place] [facility] MAIN [facility] SERVICES [address] [place], [place], [address] Phone: [phone]   Fax:  [phone]  Physical Therapy Treatment  Patient Details  Name: Kara Pennington MRN: [id number] Date of Birth: March 28, 1941 Referring Provider (PT): Dr. Lanny Cramp   Encounter Date: 10/27/2020   PT End of Session - 10/27/20 0921    Visit Number 18    Number of Visits 25    Date for PT Re-Evaluation 11/08/20    Authorization Type eval: 02/08    PT Start Time 0921    PT Stop Time 1010    PT Time Calculation (min) 49 min    Equipment Utilized During Treatment Gait belt    Activity Tolerance Patient tolerated treatment well;Patient limited by fatigue    Behavior During Therapy [facility] for tasks assessed/performed           Past Medical History:  Diagnosis Date  . Anxiety   . Depression   . GERD (gastroesophageal reflux disease)   . Hypertension     Past Surgical History:  Procedure Laterality Date  . APPENDECTOMY    . PARATHYROIDECTOMY      There were no vitals filed for this visit.   Subjective Assessment - 10/27/20 0921    Subjective Patient without complaints today. States despite being early - she is feeling well.    Patient is accompained by: Family member    Pertinent History 80 y.o. female presents to skilled physical therapy with a history of COVID-19 infection 1 year ago, hypothyroidism, and fall on 07/15/2020 with resultant nondisplaced right temporal bone fracture with associated minimal left parietal subarachnoid hemorrhage and a resolved right frontotemporal subdural hematoma who presented to [facility] hospital on 07/23/20 with intermitten but worsening word finding difficulties and expressive aphasia. While in the hospital, she had approximately 24 hours of speaking "in gibberish" and unable to speak. After about 1 day, it was much improved and she was transferred to [facility]. She normally lives in an  independent living apartment. During the week, daughter stated that she was overall doing better though she still would mix up words. On the day of admit, the daughter spoke to her at around noon and she said they had a "normal" conversation. She arrived at [facility] to visit with her at 2:15 and immediately felt that her mother was not well. She was back to speaking non-sensically. Daughter requested return to [facility]. Upon arrival she was able to follow commands and answer some questions accurately, however, she was having considerable word finding difficulties and seemed somewhat frustrated. 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    Limitations Standing;Walking    How long can you sit comfortably? unlimited time    How long can you stand comfortably? less than 5 mins    How long can you walk comfortably? less than 5 mins    Patient Stated Goals "I want to be able to get back to church and rejoin the knitting club"    Currently in Pain? No/denies           Nustep LE only x 5 min -VC to maintain > 60 spm (3 min at Level 3 and 1 min at level 2 and 1 min at level 1)  O2 sat= 99% on room air HR= 59 bpm. Patient reports 4/10 on BORG  2 min step test= 28 steps (counted right LE) - at side of TM- step tapping onto side  of TM) -completed 2 min - BORG= 5/10 and O2 sat=99% and HR=63 bpm  Standing LE strengthening:   Hip abd and Hip ext- BLE 2# AW x 10 reps (total time in standing for both exercises combined= 30 sec) - BORG=4.5  Standing partial squats and ham curls with 2# AW x 10 reps each Standing calf raises and toe raises x 10 reps each with 2# AW - BORG=4/10.    Ambulation in clinic using 4WW: 220 ft with 4WW,  HR 74 bpm, SPO2%=96%, BORG RPE 5/10- "challenging"    Education provided throughout session in the form of VC/TC and demo to facilitate movement at target joints and improved/correct muscle activation with exercises.      Clinical Impression:   Patient was able to perform more standing activities overall yet slightly decreased ambulation distance and step count- most likely attributed to addition of standing therex.  She will benefit from further skilled therapy to improve LE strength and to improve endurance/functional capacity to increase QOL.                                PT Education - 10/27/20 1556    Education Details Exercise technique    Person(s) Educated Patient    Methods Explanation;Demonstration;Tactile cues;Verbal cues;Handout    Comprehension Verbalized understanding;Returned demonstration;Verbal cues required;Tactile cues required;Need further instruction            PT Short Term Goals - 09/26/20 5329      PT SHORT TERM GOAL #1   Title Patient will be independent in home exercise program to improve strength/mobility for better functional independence with ADLs.    Baseline 09/26/2020- Patient requires cues for Breathing techniques with walking and reminders to use the BORG RPE scale. She is knowledgeable of seated LE strengthening but will continue to benefit from progression of HEP to more progressive exercises.    Time 6    Period Weeks    Status On-going    Target Date 09/27/20             PT Long Term Goals - 09/26/20 1612      PT LONG TERM GOAL #1   Title Patient will increase FOTO score to equal to or greater than to 61 demonstrate statistically significant improvement in mobility and quality of life.    Baseline 02/08: 45    Time 12    Period Weeks    Status New    Target Date 11/08/20      PT LONG TERM GOAL #2   Title Patient (> 51 years old) will complete five times sit to stand test in < 15 seconds indicating an increased LE strength and improved balance.    Baseline 02/08: 19s without BUE support. 09/26/2020= 16.32 sec without UE support.    Time 12    Period Weeks    Status On-going    Target Date 11/08/20      PT LONG TERM GOAL  #3   Title Patient will increase 10 meter walk test to >1.76m/s as to improve gait speed for better community ambulation and to reduce fall risk.    Baseline 02/08:0.37 m/s. 09/26/2020= 0.59 m/s using 4WW.    Time 12    Period Weeks    Status On-going    Target Date 11/08/20      PT LONG TERM GOAL #4   Title Patient will reduce timed up and go to <11  seconds to reduce fall risk and demonstrate improved transfer/gait ability.    Baseline 02/08: 33.43 with 2WW. 09/26/2020= 22.18 sec with 4WW    Time 12    Period Weeks    Status On-going    Target Date 11/08/20      PT LONG TERM GOAL #5   Title Patient will increase ABC scale score >80% to demonstrate better functional mobility and better confidence with ADLs.    Baseline 02/08: 48.25%    Time 12    Period Weeks    Status New      Additional Long Term Goals   Additional Long Term Goals Yes      PT LONG TERM GOAL #6   Title Patient will demonstrate improved 6 min walk test from only able to complete 100 feet in  prior to stoppage from fatigue to completing test at 550 feet or more without rest break, reporting some difficulty or less to improve walking tolerance with community ambulation including walking to dinining hall at independent living facility, grocery shopping, going to church,etc.    Baseline 09/26/2020= 100 feet in 1 min using 4WW- stopped due to fatigue.    Time 6    Status New    Target Date 11/08/20                 Plan - 10/27/20 7989    Clinical Impression Statement Patient was able to perform more standing activities overall yet slightly decreased ambulation distance and step count- most likely attributed to addition of standing therex.  She will benefit from further skilled therapy to improve LE strength and to improve endurance/functional capacity to increase QOL.    Personal Factors and Comorbidities Age;Comorbidity 3+    Comorbidities TIA, aphasia, acute bronchitis    Examination-Activity Limitations  Stairs;Squat;Bend;Stand;Transfers;Lift    Examination-Participation Restrictions Church    Stability/Clinical Decision Making Evolving/Moderate complexity    Rehab Potential Fair    PT Frequency 2x / week    PT Duration 12 weeks    PT Treatment/Interventions ADLs/Self Care Home Management;Canalith Repostioning;Moist Heat;Traction;Electrical Stimulation;Gait training;Stair training;Functional mobility training;Therapeutic activities;Therapeutic exercise;Balance training;Neuromuscular re-education;Patient/family education;Passive range of motion;Energy conservation    PT Next Visit Plan progress strengthening and balance exercises, progress cardiovascular endurance training; progress standing therex, progress volume of distance amb.    Consulted and Agree with Plan of Care Patient;Family member/caregiver    Family Member Consulted daughter           Patient will benefit from skilled therapeutic intervention in order to improve the following deficits and impairments:  Abnormal gait,Decreased endurance,Cardiopulmonary status limiting activity,Decreased activity tolerance,Decreased strength,Decreased balance,Decreased mobility,Difficulty walking,Improper body mechanics,Decreased safety awareness  Visit Diagnosis: Abnormality of gait and mobility  Difficulty in walking, not elsewhere classified  Muscle weakness (generalized)  Other lack of coordination     Problem List Patient Active Problem List   Diagnosis Date Noted  . TIA (transient ischemic attack) 02/03/2020  . Aphasia   . Acute bronchitis 10/06/2016    Lenda Kelp, PT 10/27/2020, 4:08 PM  [place] [facility] MAIN [facility] SERVICES [address] [place], [place], [address] Phone: [phone]   Fax:  [phone]  Name: Kara Pennington MRN: [id number] Date of Birth: [birth year]

## 2020-11-01 ENCOUNTER — Ambulatory Visit: Payer: Medicare Other | Admitting: Physical Therapy

## 2020-11-03 ENCOUNTER — Other Ambulatory Visit: Payer: Self-pay

## 2020-11-03 ENCOUNTER — Ambulatory Visit: Payer: Medicare Other

## 2020-11-03 ENCOUNTER — Encounter: Payer: Medicare Other | Admitting: Occupational Therapy

## 2020-11-03 DIAGNOSIS — R269 Unspecified abnormalities of gait and mobility: Secondary | ICD-10-CM

## 2020-11-03 DIAGNOSIS — M6281 Muscle weakness (generalized): Secondary | ICD-10-CM

## 2020-11-03 DIAGNOSIS — R278 Other lack of coordination: Secondary | ICD-10-CM

## 2020-11-03 DIAGNOSIS — R262 Difficulty in walking, not elsewhere classified: Secondary | ICD-10-CM

## 2020-11-03 NOTE — Therapy (Signed)
[facility] MAIN [facility] SERVICES [address] [place], [place], [address] Phone: [phone]   Fax:  [phone]  Physical Therapy Treatment  Patient Details  Name: Kara Pennington MRN: [id number] Date of Birth: [birth year] Referring Provider (PT): Dr. Lanny Cramp   Encounter Date: 11/03/2020   PT End of Session - 11/03/20 1606    Visit Number 19    Number of Visits 25    Date for PT Re-Evaluation 11/08/20    Authorization Type eval: 02/08    PT Start Time 1600    PT Stop Time 1646    PT Time Calculation (min) 46 min    Equipment Utilized During Treatment Gait belt    Activity Tolerance Patient tolerated treatment well;Patient limited by fatigue    Behavior During Therapy [facility] for tasks assessed/performed           Past Medical History:  Diagnosis Date  . Anxiety   . Depression   . GERD (gastroesophageal reflux disease)   . Hypertension     Past Surgical History:  Procedure Laterality Date  . APPENDECTOMY    . PARATHYROIDECTOMY      There were no vitals filed for this visit.   Subjective Assessment - 11/03/20 1605    Subjective Patient reports she has been down all week due to loss of close friend.    Patient is accompained by: Family member    Pertinent History 80 y.o. female presents to skilled physical therapy with a history of COVID-19 infection 1 year ago, hypothyroidism, and fall on 07/15/2020 with resultant nondisplaced right temporal bone fracture with associated minimal left parietal subarachnoid hemorrhage and a resolved right frontotemporal subdural hematoma who presented to [facility] hospital on 07/23/20 with intermitten but worsening word finding difficulties and expressive aphasia. While in the hospital, she had approximately 24 hours of speaking "in gibberish" and unable to speak. After about 1 day, it was much improved and she was transferred to [facility]. She normally lives in an independent living  apartment. During the week, daughter stated that she was overall doing better though she still would mix up words. On the day of admit, the daughter spoke to her at around noon and she said they had a "normal" conversation. She arrived at [facility] to visit with her at 2:15 and immediately felt that her mother was not well. She was back to speaking non-sensically. Daughter requested return to [facility]. Upon arrival she was able to follow commands and answer some questions accurately, however, she was having considerable word finding difficulties and seemed somewhat frustrated. 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    Limitations Standing;Walking    How long can you sit comfortably? unlimited time    How long can you stand comfortably? less than 5 mins    How long can you walk comfortably? less than 5 mins    Patient Stated Goals "I want to be able to get back to church and rejoin the knitting club"    Currently in Pain? No/denies              Nustep LE only x 5 min -VC to maintain > 60 spm (5 min at Level 2)  O2 sat= 98% on room air HR= 59 bpm. Patient reports 5/10 on BORG  Static stand in // bars- on purple pad without UE support x 2 trials- 1) 35 sec and 2) 1 min 5 sec BORG= 5/10 and  O2 sat=97% and HR=63 bpm No Loss of balance.   Seated hamstring curls using 2.5# on Matrix cable system= 2 sets x 12 reps with VC to perform slower.      Ambulation in clinic using 4WW: 450 ft with 4WW,  HR 67 bpm, SPO2%=98%, BORG RPE 5/10- "challenging"  Total time = 4 min 25 sec   Education provided throughout session in the form of VC/TC and demo to facilitate movement at target joints and improved/correct muscle activation with exercises.     Clinical Impression:   Patient exhibited her best performance with gait endurance today since beginning PT services. She was also able to static stand well today without UE (as she ususally is dependent on  forearms for support).  She will benefit from further skilled therapy to improve LE strength and to improve endurance/functional capacity to increase QOL.                           PT Education - 11/04/20 1146    Education Details specific exercise form    Person(s) Educated Patient    Methods Explanation;Demonstration;Tactile cues;Verbal cues    Comprehension Verbalized understanding;Verbal cues required;Need further instruction;Returned demonstration;Tactile cues required            PT Short Term Goals - 09/26/20 5003      PT SHORT TERM GOAL #1   Title Patient will be independent in home exercise program to improve strength/mobility for better functional independence with ADLs.    Baseline 09/26/2020- Patient requires cues for Breathing techniques with walking and reminders to use the BORG RPE scale. She is knowledgeable of seated LE strengthening but will continue to benefit from progression of HEP to more progressive exercises.    Time 6    Period Weeks    Status On-going    Target Date 09/27/20             PT Long Term Goals - 09/26/20 1612      PT LONG TERM GOAL #1   Title Patient will increase FOTO score to equal to or greater than to 61 demonstrate statistically significant improvement in mobility and quality of life.    Baseline 02/08: 45    Time 12    Period Weeks    Status New    Target Date 11/08/20      PT LONG TERM GOAL #2   Title Patient (> 76 years old) will complete five times sit to stand test in < 15 seconds indicating an increased LE strength and improved balance.    Baseline 02/08: 19s without BUE support. 09/26/2020= 16.32 sec without UE support.    Time 12    Period Weeks    Status On-going    Target Date 11/08/20      PT LONG TERM GOAL #3   Title Patient will increase 10 meter walk test to >1.20m/s as to improve gait speed for better community ambulation and to reduce fall risk.    Baseline 02/08:0.37 m/s. 09/26/2020= 0.59  m/s using 4WW.    Time 12    Period Weeks    Status On-going    Target Date 11/08/20      PT LONG TERM GOAL #4   Title Patient will reduce timed up and go to <11 seconds to reduce fall risk and demonstrate improved transfer/gait ability.    Baseline 02/08: 33.43 with 2WW. 09/26/2020= 22.18 sec with 4WW    Time 12  Period Weeks    Status On-going    Target Date 11/08/20      PT LONG TERM GOAL #5   Title Patient will increase ABC scale score >80% to demonstrate better functional mobility and better confidence with ADLs.    Baseline 02/08: 48.25%    Time 12    Period Weeks    Status New      Additional Long Term Goals   Additional Long Term Goals Yes      PT LONG TERM GOAL #6   Title Patient will demonstrate improved 6 min walk test from only able to complete 100 feet in  prior to stoppage from fatigue to completing test at 550 feet or more without rest break, reporting some difficulty or less to improve walking tolerance with community ambulation including walking to dinining hall at independent living facility, grocery shopping, going to church,etc.    Baseline 09/26/2020= 100 feet in 1 min using 4WW- stopped due to fatigue.    Time 6    Status New    Target Date 11/08/20                 Plan - 11/03/20 1606    Clinical Impression Statement Patient exhibited her best performance with gait endurance today since beginning PT services. She was also able to static stand well today without UE (as she ususally is dependent on forearms for support).  She will benefit from further skilled therapy to improve LE strength and to improve endurance/functional capacity to increase QOL.    Personal Factors and Comorbidities Age;Comorbidity 3+    Comorbidities TIA, aphasia, acute bronchitis    Examination-Activity Limitations Stairs;Squat;Bend;Stand;Transfers;Lift    Examination-Participation Restrictions Church    Stability/Clinical Decision Making Evolving/Moderate complexity     Rehab Potential Fair    PT Frequency 2x / week    PT Duration 12 weeks    PT Treatment/Interventions ADLs/Self Care Home Management;Canalith Repostioning;Moist Heat;Traction;Electrical Stimulation;Gait training;Stair training;Functional mobility training;Therapeutic activities;Therapeutic exercise;Balance training;Neuromuscular re-education;Patient/family education;Passive range of motion;Energy conservation    PT Next Visit Plan progress strengthening and balance exercises, progress cardiovascular endurance training; progress standing therex, progress volume of distance amb.    Consulted and Agree with Plan of Care Patient;Family member/caregiver    Family Member Consulted daughter           Patient will benefit from skilled therapeutic intervention in order to improve the following deficits and impairments:  Abnormal gait,Decreased endurance,Cardiopulmonary status limiting activity,Decreased activity tolerance,Decreased strength,Decreased balance,Decreased mobility,Difficulty walking,Improper body mechanics,Decreased safety awareness  Visit Diagnosis: Abnormality of gait and mobility  Difficulty in walking, not elsewhere classified  Muscle weakness (generalized)  Other lack of coordination     Problem List Patient Active Problem List   Diagnosis Date Noted  . TIA (transient ischemic attack) 02/03/2020  . Aphasia   . Acute bronchitis 10/06/2016    Lenda Kelp, PT 11/04/2020, 11:52 AM  [place] MAIN [facility] SERVICES [address] [place], [place], [address] Phone: [phone]   Fax:  [phone]  Name: Kara Pennington MRN: [id number] Date of Birth: [birth year]

## 2020-11-08 ENCOUNTER — Ambulatory Visit: Payer: Medicare Other

## 2020-11-08 ENCOUNTER — Encounter: Payer: Medicare Other | Admitting: Occupational Therapy

## 2020-11-10 ENCOUNTER — Encounter: Payer: Medicare Other | Admitting: Occupational Therapy

## 2020-11-10 ENCOUNTER — Other Ambulatory Visit: Payer: Self-pay

## 2020-11-10 ENCOUNTER — Ambulatory Visit: Payer: Medicare Other | Attending: Physical Medicine and Rehabilitation

## 2020-11-10 DIAGNOSIS — R269 Unspecified abnormalities of gait and mobility: Secondary | ICD-10-CM | POA: Insufficient documentation

## 2020-11-10 DIAGNOSIS — M6281 Muscle weakness (generalized): Secondary | ICD-10-CM | POA: Insufficient documentation

## 2020-11-10 DIAGNOSIS — R278 Other lack of coordination: Secondary | ICD-10-CM

## 2020-11-10 DIAGNOSIS — R262 Difficulty in walking, not elsewhere classified: Secondary | ICD-10-CM | POA: Diagnosis present

## 2020-11-10 NOTE — Therapy (Signed)
[place] MAIN [facility] SERVICES [place], [place], [address] Phone: [phone]   Fax:  [phone]  Physical Therapy Treatment/Recertification for dates 11/10/2020 through 02/02/2021; Physical Therapy Progress Note   Dates of reporting period  09/26/2020   to   11/10/2020  Patient Details  Name: Kara Pennington MRN: [id number] Date of Birth: Feb 03, 1941 Referring Provider (PT): Dr. Doran Clay   Encounter Date: 11/10/2020     11/10/20 0607  PT Visits / Re-Eval  Visit Number 20  Number of Visits 25  Date for PT Re-Evaluation 02/02/21  Authorization  Authorization Type eval: 13/24; PN and Recert on 4/0/1027- 2/53/6644  PT Time Calculation  PT Start Time 1146  PT Stop Time 1230  PT Time Calculation (min) 44 min  PT - End of Session  Equipment Utilized During Treatment Gait belt  Activity Tolerance Patient tolerated treatment well;Patient limited by fatigue  Behavior During Therapy [facility] for tasks assessed/performed    Past Medical History:  Diagnosis Date  . Anxiety   . Depression   . GERD (gastroesophageal reflux disease)   . Hypertension     Past Surgical History:  Procedure Laterality Date  . APPENDECTOMY    . PARATHYROIDECTOMY      11/10/20 1153  Symptoms/Limitations  Subjective Patient reports doing well today.  Patient is accompained by: Family member  Pertinent History 80 y.o. female presents to skilled physical therapy with a history of COVID-19 infection 1 year ago, hypothyroidism, and fall on 07/15/2020 with resultant nondisplaced right temporal bone fracture with associated minimal left parietal subarachnoid hemorrhage and a resolved right frontotemporal subdural hematoma who presented to [facility] on 07/23/20 with intermitten but worsening word finding difficulties and expressive aphasia. While in the hospital, she had approximately 24 hours of speaking "in gibberish" and unable to speak. After about 1  day, it was much improved and she was transferred to [facility]. She normally lives in an independent living apartment. During the week, daughter stated that she was overall doing better though she still would mix up words. On the day of admit, the daughter spoke to her at around noon and she said they had a "normal" conversation. She arrived at [facility] to visit with her at 2:15 and immediately felt that her mother was not well. She was back to speaking non-sensically. Daughter requested return to [facility]. Upon arrival she was able to follow commands and answer some questions accurately, however, she was having considerable word finding difficulties and seemed somewhat frustrated. 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  Limitations Standing;Walking  How long can you sit comfortably? unlimited time  How long can you stand comfortably? less than 5 mins  How long can you walk comfortably? less than 5 mins  Patient Stated Goals "I want to be able to get back to church and rejoin the knitting club"  Pain Assessment  Currently in Pain? No/denies    11/10/20 1156  PT Education  Education Details Education on functional outcome measures and goals  Person(s) Educated Patient  Methods Explanation  Comprehension Verbalized understanding    There were no vitals filed for this visit.   Reassess all STG and LTGs today - see goal section for all details.         Clinical Impression: Patient reassessed today for progress note and recertification. She had made some significant progress in all goals since beginning PT services. At this time she has  met her short term goal and knowledgeable of a HEP for strengthening and functional endurance. She has progressed in functional outcome measures including FOTO, 5x STS, TUG, 10 meter walk test and the 6 min walk test- all improved from scores from original evaluation. While she has made  significant progress she still remains below normative data and continues to exhibit fatigue with increased distance and prolonged activities. Patient's condition has the potential to improve in response to therapy. Maximum improvement is yet to be obtained. The anticipated improvement is attainable and reasonable in a generally predictable time.                      PT Short Term Goals - 11/10/20 0609      PT SHORT TERM GOAL #1   Title Patient will be independent in home exercise program to improve strength/mobility for better functional independence with ADLs.    Baseline 09/26/2020- Patient requires cues for Breathing techniques with walking and reminders to use the BORG RPE scale. She is knowledgeable of seated LE strengthening but will continue to benefit from progression of HEP to more progressive exercises. 11/10/2020- Patient able to verbalize and demo good working knowledge of breathing technqiues, BORG Scale, and seated/standing therex along with walking program with no questions or concerns at this time.    Time 6    Period Weeks    Status Achieved    Target Date 09/27/20             PT Long Term Goals - 11/10/20 1157      PT LONG TERM GOAL #1   Title Patient will increase FOTO score to equal to or greater than to 61 demonstrate statistically significant improvement in mobility and quality of life.    Baseline 02/08: 45; 11/10/2020= 49    Time 12    Period Weeks    Status On-going    Target Date 02/02/21      PT LONG TERM GOAL #2   Title Patient (> 70 years old) will complete five times sit to stand test in < 15 seconds indicating an increased LE strength and improved balance.    Baseline 02/08: 19s without BUE support. 09/26/2020= 16.32 sec without UE support. 11/10/2020= 17.03 sec without UE support.    Time 12    Period Weeks    Status On-going    Target Date 02/02/21      PT LONG TERM GOAL #3   Title Patient will increase 10 meter walk test to >1.74ms as  to improve gait speed for better community ambulation and to reduce fall risk.    Baseline 02/08:0.37 m/s. 09/26/2020= 0.59 m/s using 4WW. 11/10/2020= 0.75 m/s using 4WW    Time 12    Period Weeks    Status On-going    Target Date 02/02/21      PT LONG TERM GOAL #4   Title Patient will reduce timed up and go to <11 seconds to reduce fall risk and demonstrate improved transfer/gait ability.    Baseline 02/08: 33.43 with 2WW. 09/26/2020= 22.18 sec with 4WW; 11/10/2020=19.33 with 4WW    Time 12    Period Weeks    Status On-going    Target Date 02/02/21      PT LONG TERM GOAL #5   Title Patient will increase ABC scale score >80% to demonstrate better functional mobility and better confidence with ADLs.    Baseline 02/08: 48.25%; 11/10/2020= 53.13    Time 12  Period Weeks    Status On-going    Target Date 02/02/21      PT LONG TERM GOAL #6   Title Patient will demonstrate improved 6 min walk test from only able to complete 100 feet in  61mn prior to stoppage from fatigue to completing test at 550 feet or more without rest break, reporting some difficulty or less to improve walking tolerance with community ambulation including walking to dinining hall at independent living facility, grocery shopping, going to church,etc.    Baseline 09/26/2020= 100 feet in 1 min using 4WW- stopped due to fatigue. 11/10/2020= 555 feet with 4WW in 4 min 28 sec.    Time 12    Period Weeks    Status On-going    Target Date 02/02/21             11/10/20 0608  Plan  Clinical Impression Statement Patient reassessed today for progress note and recertification. She had made some significant progress in all goals since beginning PT services. At this time she has met her short term goal and knowledgeable of a HEP for strengthening and functional endurance. She has progressed in functional outcome measures including FOTO, 5x STS, TUG, 10 meter walk test and the 6 min walk test- all improved from scores from original  evaluation. While she has made significant progress she still remains below normative data and continues to exhibit fatigue with increased distance and prolonged activities. Patient's condition has the potential to improve in response to therapy. Maximum improvement is yet to be obtained. The anticipated improvement is attainable and reasonable in a generally predictable time.  Personal Factors and Comorbidities Age;Comorbidity 3+  Comorbidities TIA, aphasia, acute bronchitis  Examination-Activity Limitations Stairs;Squat;Bend;Stand;Transfers;Lift  Examination-Participation Restrictions Church  Pt will benefit from skilled therapeutic intervention in order to improve on the following deficits Abnormal gait;Decreased endurance;Cardiopulmonary status limiting activity;Decreased activity tolerance;Decreased strength;Decreased balance;Decreased mobility;Difficulty walking;Improper body mechanics;Decreased safety awareness  Stability/Clinical Decision Making Evolving/Moderate complexity  Rehab Potential Fair  PT Frequency 2x / week  PT Duration 12 weeks  PT Treatment/Interventions ADLs/Self Care Home Management;Canalith Repostioning;Moist Heat;Traction;Electrical Stimulation;Gait training;Stair training;Functional mobility training;Therapeutic activities;Therapeutic exercise;Balance training;Neuromuscular re-education;Patient/family education;Passive range of motion;Energy conservation  PT Next Visit Plan progress strengthening and balance exercises, progress cardiovascular endurance training; progress standing therex, progress volume of distance amb.  Consulted and Agree with Plan of Care Patient          Patient will benefit from skilled therapeutic intervention in order to improve the following deficits and impairments:  Abnormal gait,Decreased endurance,Cardiopulmonary status limiting activity,Decreased activity tolerance,Decreased strength,Decreased balance,Decreased mobility,Difficulty  walking,Improper body mechanics,Decreased safety awareness  Visit Diagnosis: Abnormality of gait and mobility  Difficulty in walking, not elsewhere classified  Muscle weakness (generalized)  Other lack of coordination     Problem List Patient Active Problem List   Diagnosis Date Noted  . TIA (transient ischemic attack) 02/03/2020  . Aphasia   . Acute bronchitis 10/06/2016    JLewis Moccasin PT 11/14/2020, 3:03 PM  [facility] [address] [address]Phone: [phone]  Fax:  [phone] Name: Kara STOWELLMRN: [id number] of Birth: 707-27-1942

## 2020-11-15 ENCOUNTER — Encounter: Payer: Medicare Other | Admitting: Occupational Therapy

## 2020-11-17 ENCOUNTER — Ambulatory Visit: Payer: Medicare Other

## 2020-11-17 ENCOUNTER — Other Ambulatory Visit: Payer: Self-pay

## 2020-11-17 DIAGNOSIS — R278 Other lack of coordination: Secondary | ICD-10-CM

## 2020-11-17 DIAGNOSIS — R269 Unspecified abnormalities of gait and mobility: Secondary | ICD-10-CM | POA: Diagnosis not present

## 2020-11-17 DIAGNOSIS — M6281 Muscle weakness (generalized): Secondary | ICD-10-CM

## 2020-11-17 DIAGNOSIS — R262 Difficulty in walking, not elsewhere classified: Secondary | ICD-10-CM

## 2020-11-17 NOTE — Therapy (Signed)
[facility] MAIN [facility] [address] [place], [place], [address] Phone: [phone]   Fax:  [phone]  Physical Therapy Treatment  Patient Details  Name: Kara Pennington MRN: [id number] Date of Birth: August 26, 1940 Referring Provider (PT): Dr. Lanny CrampPatrick O'Brien   Encounter Date: 11/17/2020   PT End of Session - 11/17/20 1157    Visit Number 21    Number of Visits 25    Date for PT Re-Evaluation 02/02/21    Authorization Type eval: 02/08; PN and Recert on 11/10/2020- 02/02/2021    PT Start Time 1525    PT Stop Time 1559    PT Time Calculation (min) 34 min    Equipment Utilized During Treatment Gait belt    Activity Tolerance Patient tolerated treatment well;Patient limited by fatigue    Behavior During Therapy [facility] for tasks assessed/performed           Past Medical History:  Diagnosis Date  . Anxiety   . Depression   . GERD (gastroesophageal reflux disease)   . Hypertension     Past Surgical History:  Procedure Laterality Date  . APPENDECTOMY    . PARATHYROIDECTOMY      There were no vitals filed for this visit.   Subjective Assessment - 11/17/20 1156    Subjective Patient reports doing okay but more fatigued today. She did report some good news from her MD and she had a clear work up from her Subarachnoid hemorrhage.    Patient is accompained by: Family member    Pertinent History 80 y.o. female presents to skilled physical therapy with a history of COVID-19 infection 1 year ago, hypothyroidism, and fall on 07/15/2020 with resultant nondisplaced right temporal bone fracture with associated minimal left parietal subarachnoid hemorrhage and a resolved right frontotemporal subdural hematoma who [facility] with intermitten but worsening word finding difficulties and expressive aphasia. While in the hospital, she had approximately 24 hours of speaking "in gibberish" and unable to speak. After about 1 day,  it was much improved and she was transferred [facility]. She normally lives in an independent living apartment. During the week, daughter stated that she was overall doing better though she still would mix up words. On the day of admit, the daughter spoke to her at around noon and she said they had a "normal" conversation. [facility] her at 2:15 and immediately felt that her mother was not well. She was back to speaking non-sensically. D[facility] Upon arrival she was able to follow commands and answer some questions accurately, however, she was having considerable word finding difficulties and seemed somewhat frustrated. 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    Limitations Standing;Walking    How long can you sit comfortably? unlimited time    How long can you stand comfortably? less than 5 mins    How long can you walk comfortably? less than 5 mins    Patient Stated Goals "I want to be able to get back to church and rejoin the knitting club"    Currently in Pain? No/denies            Interventions:   Nustep: HIIT workout for total of 10 min 45 sec at 0.25 mi- intervals from resistance level 0-2 for 291min-2 min followed by level 7 for 30 sec and repeat.  *Patient reported activity as Hard- completed 0.25  yet required VC to try to maintain > 30 SPM. Patient presented with increased fatigue upon completion- O2 sat=97% and HR=60 bpm.  Step up - 6" step in //bars with BUE support. - Patient began struggling after 4 reps and stopped after 5 reps for brief rest break .  Patient then performed 5 more reps bilaterally- rating at a 6/10 on BORG- O2 sat=95% HR=62 bpm   wall posture stretch. Patient performed UE flex with PVC attempting to perform with static standing with more LE weightbearing and improved posture- Patient performed 10 reps but reports "exhausted" upon completion.    Scap retract: 7.5 lb - 2 sets of 12 reps- Patient stopped after completing and reported 3/10 on BORG- Education provided throughout session via VC/TC and demonstration to facilitate movement at target joints and correct muscle activation for all testing and exercises performed.  Clinical Impression: Patient exhibited decreased overall functional endurance - appeared more fatigued with all activities today. She was able to complete all tasks with increased effort today. She will benefit from further skilled therapy to improve LE strength and to improve endurance/functional capacity to increase quality of life.                        PT Education - 11/18/20 1157    Education Details specific HIIT education    Person(s) Educated Patient    Methods Explanation;Demonstration;Tactile cues;Verbal cues    Comprehension Verbalized understanding;Verbal cues required;Need further instruction;Returned demonstration;Tactile cues required            PT Short Term Goals - 11/10/20 0609      PT SHORT TERM GOAL #1   Title Patient will be independent in home exercise program to improve strength/mobility for better functional independence with ADLs.    Baseline 09/26/2020- Patient requires cues for Breathing techniques with walking and reminders to use the BORG RPE scale. She is knowledgeable of seated LE strengthening but will continue to benefit from progression of HEP to more progressive exercises. 11/10/2020- Patient able to verbalize and demo good working knowledge of breathing technqiues, BORG Scale, and seated/standing therex along with walking program with no questions or concerns at this time.    Time 6    Period Weeks    Status Achieved    Target Date 09/27/20             PT Long Term Goals - 11/10/20 1157      PT LONG TERM GOAL #1   Title Patient will increase FOTO score to equal to or greater than to 61 demonstrate statistically significant improvement in mobility and  quality of life.    Baseline 02/08: 45; 11/10/2020= 49    Time 12    Period Weeks    Status On-going    Target Date 02/02/21      PT LONG TERM GOAL #2   Title Patient (> 72 years old) will complete five times sit to stand test in < 15 seconds indicating an increased LE strength and improved balance.    Baseline 02/08: 19s without BUE support. 09/26/2020= 16.32 sec without UE support. 11/10/2020= 17.03 sec without UE support.    Time 12    Period Weeks    Status On-going    Target Date 02/02/21      PT LONG TERM GOAL #3   Title Patient will increase 10 meter walk test to >1.41m/s as to improve gait speed for better community ambulation and to reduce fall risk.  Baseline 02/08:0.37 m/s. 09/26/2020= 0.59 m/s using 4WW. 11/10/2020= 0.75 m/s using 4WW    Time 12    Period Weeks    Status On-going    Target Date 02/02/21      PT LONG TERM GOAL #4   Title Patient will reduce timed up and go to <11 seconds to reduce fall risk and demonstrate improved transfer/gait ability.    Baseline 02/08: 33.43 with 2WW. 09/26/2020= 22.18 sec with 4WW; 11/10/2020=19.33 with 4WW    Time 12    Period Weeks    Status On-going    Target Date 02/02/21      PT LONG TERM GOAL #5   Title Patient will increase ABC scale score >80% to demonstrate better functional mobility and better confidence with ADLs.    Baseline 02/08: 48.25%; 11/10/2020= 53.13    Time 12    Period Weeks    Status On-going    Target Date 02/02/21      PT LONG TERM GOAL #6   Title Patient will demonstrate improved 6 min walk test from only able to complete 100 feet in  prior to stoppage from fatigue to completing test at 550 feet or more without rest break, reporting some difficulty or less to improve walking tolerance with community ambulation including walking to dinining hall at independent living facility, grocery shopping, going to church,etc.    Baseline 09/26/2020= 100 feet in 1 min using 4WW- stopped due to fatigue. 11/10/2020= 555 feet  with 4WW in 4 min 28 sec.    Time 12    Period Weeks    Status On-going    Target Date 02/02/21                 Plan - 11/17/20 1158    Clinical Impression Statement Patient exhibited decreased overall functional endurance - appeared more fatigued with all activities today. She was able to complete all tasks with increased effort today. She will benefit from further skilled therapy to improve LE strength and to improve endurance/functional capacity to increase quality of life.    Personal Factors and Comorbidities Age;Comorbidity 3+    Comorbidities TIA, aphasia, acute bronchitis    Examination-Activity Limitations Stairs;Squat;Bend;Stand;Transfers;Lift    Examination-Participation Restrictions Church    Stability/Clinical Decision Making Evolving/Moderate complexity    Rehab Potential Fair    PT Frequency 2x / week    PT Duration 12 weeks    PT Treatment/Interventions ADLs/Self Care Home Management;Canalith Repostioning;Moist Heat;Traction;Electrical Stimulation;Gait training;Stair training;Functional mobility training;Therapeutic activities;Therapeutic exercise;Balance training;Neuromuscular re-education;Patient/family education;Passive range of motion;Energy conservation    PT Next Visit Plan progress strengthening and balance exercises, progress cardiovascular endurance training; progress standing therex, progress volume of distance amb.    Consulted and Agree with Plan of Care Patient           Patient will benefit from skilled therapeutic intervention in order to improve the following deficits and impairments:  Abnormal gait,Decreased endurance,Cardiopulmonary status limiting activity,Decreased activity tolerance,Decreased strength,Decreased balance,Decreased mobility,Difficulty walking,Improper body mechanics,Decreased safety awareness  Visit Diagnosis: Abnormality of gait and mobility  Difficulty in walking, not elsewhere classified  Muscle weakness  (generalized)  Other lack of coordination     Problem List Patient Active Problem List   Diagnosis Date Noted  . TIA (transient ischemic attack) 02/03/2020  . Aphasia   . Acute bronchitis 10/06/2016    Kara Pennington, PT 11/18/2020, 12:04 PM  [facility] MAIN [facility] SERVICES [address] [place], [place], [address] Phone:  [phone]   Fax:  [phone]  Name: Kara Pennington MRN: [id number] Date of Birth: February 14, 1941

## 2020-11-22 ENCOUNTER — Ambulatory Visit: Payer: Medicare Other

## 2020-11-22 ENCOUNTER — Other Ambulatory Visit: Payer: Self-pay

## 2020-11-22 ENCOUNTER — Encounter: Payer: Medicare Other | Admitting: Occupational Therapy

## 2020-11-22 DIAGNOSIS — R269 Unspecified abnormalities of gait and mobility: Secondary | ICD-10-CM | POA: Diagnosis not present

## 2020-11-22 DIAGNOSIS — M6281 Muscle weakness (generalized): Secondary | ICD-10-CM

## 2020-11-22 DIAGNOSIS — R278 Other lack of coordination: Secondary | ICD-10-CM

## 2020-11-22 NOTE — Therapy (Signed)
[facility] MAIN [facility] SERVICES [address] [place], [place], [address] Phone: [phone]   Fax:  [phone]  Physical Therapy Treatment  Patient Details  Name: Kara Pennington MRN: [id number] Date of Birth: 23-Sep-1940 Referring Provider (PT): Dr. Lanny Cramp   Encounter Date: 11/22/2020   PT End of Session - 11/22/20 1115    Visit Number 22    Number of Visits 25    Date for PT Re-Evaluation 02/02/21    Authorization Type eval: 02/08; PN and Recert on 11/10/2020- 02/02/2021    PT Start Time 1103    PT Stop Time 1144    PT Time Calculation (min) 41 min    Equipment Utilized During Treatment Gait belt    Activity Tolerance Patient tolerated treatment well;Patient limited by fatigue    Behavior During Therapy [facility] for tasks assessed/performed           Past Medical History:  Diagnosis Date  . Anxiety   . Depression   . GERD (gastroesophageal reflux disease)   . Hypertension     Past Surgical History:  Procedure Laterality Date  . APPENDECTOMY    . PARATHYROIDECTOMY      There were no vitals filed for this visit.   Subjective Assessment - 11/22/20 1111    Subjective Patient reports feeling okay  - no new complaints and no pain today.    Patient is accompained by: Family member    Pertinent History 80 y.o. female presents to skilled physical therapy with a history of COVID-19 infection 1 year ago, hypothyroidism, and fall on 07/15/2020 with resultant nondisplaced right temporal bone fracture with associated minimal left parietal subarachnoid hemorrhage and a resolved right frontotemporal subdural hematoma who presented to [facility] hospital on 07/23/20 with intermitten but worsening word finding difficulties and expressive aphasia. While in the hospital, she had approximately 24 hours of speaking "in gibberish" and unable to speak. After about 1 day, it was much improved and she was transferred to [facility]. She  normally lives in an independent living apartment. During the week, daughter stated that she was overall doing better though she still would mix up words. On the day of admit, the daughter spoke to her at around noon and she said they had a "normal" conversation. She arrived at [facility] to visit with her at 2:15 and immediately felt that her mother was not well. She was back to speaking non-sensically. Daughter requested return to [facility]. Upon arrival she was able to follow commands and answer some questions accurately, however, she was having considerable word finding difficulties and seemed somewhat frustrated. 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    Limitations Standing;Walking    How long can you sit comfortably? unlimited time    How long can you stand comfortably? less than 5 mins    How long can you walk comfortably? less than 5 mins    Patient Stated Goals "I want to be able to get back to church and rejoin the knitting club"    Currently in Pain? No/denies                  Interventions:   Nustep: HIIT workout for total of 10 min  sec at 0.23 mi- intervals from resistance level 0-2 for 75min-2 min followed by level 5-6 for 30 sec and repeat.  *Patient reported activity as Hard- Patient presented with increased fatigue upon completion- O2 sat=97% and HR=62  bpm. Patient reported 6/10 on Modified BORG RPE.    wall posture stretch. Patient performed static stand at wall with more LE weightbearing and improved posture- Patient performed 1 min 16 sec prior to requesting to sit due to fatigue  Sit to stand from edge of mat with min BUE support x 10 reps - Patient rates as easy for the 1st 5 reps and then medium for the last 5 reps . Patient required rest break due to fatigue upon completion.   Scap retract with GTB 2 sets of 12 reps- patient  reported 3/10 on BORG RPE  Seated hip flex/abd up and over Green therastep with 1/2 white  foam roll on top - 2 sets fo 10 reps each LE. Patient reports very fatigue upon completion.   Education provided throughout session via VC/TC and demonstration to facilitate movement at target joints and correct muscle activation for all testing and exercises performed.  Clinical Impression:  Patient continues to exhibit undo fatigue with activities- difficulty performing postural exercises due to weakness. She performed well with overall cues for postural strengthening today and she will benefit from further skilled therapy to improve LE strength and to improve endurance/functional capacity to increase quality of life.                                   PT Education - 11/22/20 1113    Education Details Endurance education  and LE strengthening exercise form    Person(s) Educated Patient    Methods Explanation;Demonstration;Tactile cues;Verbal cues    Comprehension Verbalized understanding;Returned demonstration;Verbal cues required;Tactile cues required;Need further instruction            PT Short Term Goals - 11/10/20 3235      PT SHORT TERM GOAL #1   Title Patient will be independent in home exercise program to improve strength/mobility for better functional independence with ADLs.    Baseline 09/26/2020- Patient requires cues for Breathing techniques with walking and reminders to use the BORG RPE scale. She is knowledgeable of seated LE strengthening but will continue to benefit from progression of HEP to more progressive exercises. 11/10/2020- Patient able to verbalize and demo good working knowledge of breathing technqiues, BORG Scale, and seated/standing therex along with walking program with no questions or concerns at this time.    Time 6    Period Weeks    Status Achieved    Target Date 09/27/20             PT Long Term Goals - 11/10/20 1157      PT LONG TERM GOAL #1   Title Patient will increase FOTO score to equal to or greater than to 61  demonstrate statistically significant improvement in mobility and quality of life.    Baseline 02/08: 45; 11/10/2020= 49    Time 12    Period Weeks    Status On-going    Target Date 02/02/21      PT LONG TERM GOAL #2   Title Patient (> 33 years old) will complete five times sit to stand test in < 15 seconds indicating an increased LE strength and improved balance.    Baseline 02/08: 19s without BUE support. 09/26/2020= 16.32 sec without UE support. 11/10/2020= 17.03 sec without UE support.    Time 12    Period Weeks    Status On-going    Target Date 02/02/21      PT LONG  TERM GOAL #3   Title Patient will increase 10 meter walk test to >1.6370m/s as to improve gait speed for better community ambulation and to reduce fall risk.    Baseline 02/08:0.37 m/s. 09/26/2020= 0.59 m/s using 4WW. 11/10/2020= 0.75 m/s using 4WW    Time 12    Period Weeks    Status On-going    Target Date 02/02/21      PT LONG TERM GOAL #4   Title Patient will reduce timed up and go to <11 seconds to reduce fall risk and demonstrate improved transfer/gait ability.    Baseline 02/08: 33.43 with 2WW. 09/26/2020= 22.18 sec with 4WW; 11/10/2020=19.33 with 4WW    Time 12    Period Weeks    Status On-going    Target Date 02/02/21      PT LONG TERM GOAL #5   Title Patient will increase ABC scale score >80% to demonstrate better functional mobility and better confidence with ADLs.    Baseline 02/08: 48.25%; 11/10/2020= 53.13    Time 12    Period Weeks    Status On-going    Target Date 02/02/21      PT LONG TERM GOAL #6   Title Patient will demonstrate improved 6 min walk test from only able to complete 100 feet in  1min prior to stoppage from fatigue to completing test at 550 feet or more without rest break, reporting some difficulty or less to improve walking tolerance with community ambulation including walking to dinining hall at independent living facility, grocery shopping, going to church,etc.    Baseline 09/26/2020= 100  feet in 1 min using 4WW- stopped due to fatigue. 11/10/2020= 555 feet with 4WW in 4 min 28 sec.    Time 12    Period Weeks    Status On-going    Target Date 02/02/21                 Plan - 11/22/20 1116    Clinical Impression Statement Patient continues to exhibit undo fatigue with activities- difficulty performing postural exercises due to weakness. She performed well with overall cues for postural strengthening today and she will benefit from further skilled therapy to improve LE strength and to improve endurance/functional capacity to increase quality of life.    Personal Factors and Comorbidities Age;Comorbidity 3+    Comorbidities TIA, aphasia, acute bronchitis    Examination-Activity Limitations Stairs;Squat;Bend;Stand;Transfers;Lift    Examination-Participation Restrictions Church    Stability/Clinical Decision Making Evolving/Moderate complexity    Rehab Potential Fair    PT Frequency 2x / week    PT Duration 12 weeks    PT Treatment/Interventions ADLs/Self Care Home Management;Canalith Repostioning;Moist Heat;Traction;Electrical Stimulation;Gait training;Stair training;Functional mobility training;Therapeutic activities;Therapeutic exercise;Balance training;Neuromuscular re-education;Patient/family education;Passive range of motion;Energy conservation    PT Next Visit Plan progress strengthening and balance exercises, progress cardiovascular endurance training; progress standing therex, progress volume of distance amb.    Consulted and Agree with Plan of Care Patient           Patient will benefit from skilled therapeutic intervention in order to improve the following deficits and impairments:  Abnormal gait,Decreased endurance,Cardiopulmonary status limiting activity,Decreased activity tolerance,Decreased strength,Decreased balance,Decreased mobility,Difficulty walking,Improper body mechanics,Decreased safety awareness  Visit Diagnosis: Abnormality of gait and  mobility  Muscle weakness (generalized)  Other lack of coordination     Problem List Patient Active Problem List   Diagnosis Date Noted  . TIA (transient ischemic attack) 02/03/2020  . Aphasia   . Acute bronchitis 10/06/2016  Lenda Kelp 11/23/2020, 2:34 PM  [facility] MAIN [facility] SERVICES [address] [place], [place], [address] Phone: [phone]   Fax:  [phone]  Name: ANGELEA PENNY MRN: [id number] Date of Birth: 1941-02-14

## 2020-11-24 ENCOUNTER — Encounter: Payer: Medicare Other | Admitting: Occupational Therapy

## 2020-11-29 ENCOUNTER — Encounter: Payer: Medicare Other | Admitting: Occupational Therapy

## 2020-11-29 ENCOUNTER — Ambulatory Visit: Payer: Medicare Other

## 2020-11-29 ENCOUNTER — Other Ambulatory Visit: Payer: Self-pay

## 2020-11-29 DIAGNOSIS — R278 Other lack of coordination: Secondary | ICD-10-CM

## 2020-11-29 DIAGNOSIS — R262 Difficulty in walking, not elsewhere classified: Secondary | ICD-10-CM

## 2020-11-29 DIAGNOSIS — M6281 Muscle weakness (generalized): Secondary | ICD-10-CM

## 2020-11-29 DIAGNOSIS — R269 Unspecified abnormalities of gait and mobility: Secondary | ICD-10-CM

## 2020-11-29 NOTE — Therapy (Signed)
[facility] MAIN [facility] SERVICES [address] [place], [place], [address] Phone: [phone]   Fax:  [phone]  Physical Therapy Treatment  Patient Details  Name: Kara Pennington MRN: [id number] Date of Birth: 07-01-1941 Referring Provider (PT): Dr. Lanny Cramp   Encounter Date: 11/29/2020   PT End of Session - 11/29/20 1108    Visit Number 23    Number of Visits 50    Date for PT Re-Evaluation 02/02/21    Authorization Type eval: 02/08; PN and Recert on 11/10/2020- 02/02/2021    PT Start Time 1102    PT Stop Time 1143    PT Time Calculation (min) 41 min    Equipment Utilized During Treatment Gait belt    Activity Tolerance Patient tolerated treatment well;Patient limited by fatigue    Behavior During Therapy [facility] for tasks assessed/performed           Past Medical History:  Diagnosis Date  . Anxiety   . Depression   . GERD (gastroesophageal reflux disease)   . Hypertension     Past Surgical History:  Procedure Laterality Date  . APPENDECTOMY    . PARATHYROIDECTOMY      There were no vitals filed for this visit.   Subjective Assessment - 11/29/20 1106    Subjective Patient reports feeling okay  - "Today was the first day that I was able to walk in here and not use the chair. "    Patient is accompained by: Family member    Pertinent History 80 y.o. female presents to skilled physical therapy with a history of COVID-19 infection 1 year ago, hypothyroidism, and fall on 07/15/2020 with resultant nondisplaced right temporal bone fracture with associated minimal left parietal subarachnoid hemorrhage and a resolved right frontotemporal subdural hematoma who presented to [facility] hospital on 07/23/20 with intermitten but worsening word finding difficulties and expressive aphasia. While in the hospital, she had approximately 24 hours of speaking "in gibberish" and unable to speak. After about 1 day, it was much improved and she was  transferred to [facility]. She normally lives in an independent living apartment. During the week, daughter stated that she was overall doing better though she still would mix up words. On the day of admit, the daughter spoke to her at around noon and she said they had a "normal" conversation. She arrived at [facility] to visit with her at 2:15 and immediately felt that her mother was not well. She was back to speaking non-sensically. Daughter requested return to [facility]. Upon arrival she was able to follow commands and answer some questions accurately, however, she was having considerable word finding difficulties and seemed somewhat frustrated. 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    Limitations Standing;Walking    How long can you sit comfortably? unlimited time    How long can you stand comfortably? less than 5 mins    How long can you walk comfortably? less than 5 mins    Patient Stated Goals "I want to be able to get back to church and rejoin the knitting club"    Currently in Pain? No/denies               Interventions:  Nutstep: L2 LE only x 5 min at 0.15 mi- patient rates as medium.   Step ups - BUE support of //Bars - onto blue airex pad x 10 reps BLE - patient reports at 5/10 on BORG  RPE scale.    Side stepping - length of // bars with 3lb AW  X 2 trials  Side step up/over hedgehog (initially with 3lb. AW yet only able to perform 3 reps each then removed AW and patient able to perform total of 7 reps each. Patient stopped secondary to increased fatigue.  Sit to stand from seat of 4WW without UE support x 10 reps with CGA- VC for no UE support and patient performed well- struggled with last 2 reps yet able to complete.   Precor Leg press: 25lb. BLE 2 sets of 10 reps. Min assist for form. Patient reported increased fatigue requiring increased rest break.  Gait for functional endurance. Patient ambulated approx  175 feet today using 4WW, Supervision exhibiting forward trunk posture with weight on forearms due to fatigue and reports no pain if leaning forward in back.   Clinical Impression: Patient able to endorse improved standing tolerance with activities today. She continues to fatigue quickly yet able to perform more activities with less overall rest or difficulty today. Patient is progressing and walked down to PT session vs. Riding in w/c for the first time as well as walked with PT back up to main level of hospital. she will benefit from further skilled therapy to improve LE strength and to improve endurance/functional capacity to increase quality of life.                       PT Short Term Goals - 11/10/20 0609      PT SHORT TERM GOAL #1   Title Patient will be independent in home exercise program to improve strength/mobility for better functional independence with ADLs.    Baseline 09/26/2020- Patient requires cues for Breathing techniques with walking and reminders to use the BORG RPE scale. She is knowledgeable of seated LE strengthening but will continue to benefit from progression of HEP to more progressive exercises. 11/10/2020- Patient able to verbalize and demo good working knowledge of breathing technqiues, BORG Scale, and seated/standing therex along with walking program with no questions or concerns at this time.    Time 6    Period Weeks    Status Achieved    Target Date 09/27/20             PT Long Term Goals - 11/10/20 1157      PT LONG TERM GOAL #1   Title Patient will increase FOTO score to equal to or greater than to 61 demonstrate statistically significant improvement in mobility and quality of life.    Baseline 02/08: 45; 11/10/2020= 49    Time 12    Period Weeks    Status On-going    Target Date 02/02/21      PT LONG TERM GOAL #2   Title Patient (> 333 years old) will complete five times sit to stand test in < 15 seconds indicating an increased LE  strength and improved balance.    Baseline 02/08: 19s without BUE support. 09/26/2020= 16.32 sec without UE support. 11/10/2020= 17.03 sec without UE support.    Time 12    Period Weeks    Status On-going    Target Date 02/02/21      PT LONG TERM GOAL #3   Title Patient will increase 10 meter walk test to >1.1868m/s as to improve gait speed for better community ambulation and to reduce fall risk.    Baseline 02/08:0.37 m/s. 09/26/2020= 0.59 m/s using 4WW. 11/10/2020= 0.75 m/s using 1OX4WW  Time 12    Period Weeks    Status On-going    Target Date 02/02/21      PT LONG TERM GOAL #4   Title Patient will reduce timed up and go to <11 seconds to reduce fall risk and demonstrate improved transfer/gait ability.    Baseline 02/08: 33.43 with 2WW. 09/26/2020= 22.18 sec with 4WW; 11/10/2020=19.33 with 4WW    Time 12    Period Weeks    Status On-going    Target Date 02/02/21      PT LONG TERM GOAL #5   Title Patient will increase ABC scale score >80% to demonstrate better functional mobility and better confidence with ADLs.    Baseline 02/08: 48.25%; 11/10/2020= 53.13    Time 12    Period Weeks    Status On-going    Target Date 02/02/21      PT LONG TERM GOAL #6   Title Patient will demonstrate improved 6 min walk test from only able to complete 100 feet in  prior to stoppage from fatigue to completing test at 550 feet or more without rest break, reporting some difficulty or less to improve walking tolerance with community ambulation including walking to dinining hall at independent living facility, grocery shopping, going to church,etc.    Baseline 09/26/2020= 100 feet in 1 min using 4WW- stopped due to fatigue. 11/10/2020= 555 feet with 4WW in 4 min 28 sec.    Time 12    Period Weeks    Status On-going    Target Date 02/02/21                 Plan - 11/29/20 1109    Clinical Impression Statement Patient able to endorse improved standing tolerance with activities today. She continues to  fatigue quickly yet able to perform more activities with less overall rest or difficulty today. Patient is progressing and walked down to PT session vs. Riding in w/c for the first time as well as walked with PT back up to main level of hospital. she will benefit from further skilled therapy to improve LE strength and to improve endurance/functional capacity to increase quality of life.    Personal Factors and Comorbidities Age;Comorbidity 3+    Comorbidities TIA, aphasia, acute bronchitis    Examination-Activity Limitations Stairs;Squat;Bend;Stand;Transfers;Lift    Examination-Participation Restrictions Church    Stability/Clinical Decision Making Evolving/Moderate complexity    Rehab Potential Fair    PT Frequency 2x / week    PT Duration 12 weeks    PT Treatment/Interventions ADLs/Self Care Home Management;Canalith Repostioning;Moist Heat;Traction;Electrical Stimulation;Gait training;Stair training;Functional mobility training;Therapeutic activities;Therapeutic exercise;Balance training;Neuromuscular re-education;Patient/family education;Passive range of motion;Energy conservation    PT Next Visit Plan progress strengthening and balance exercises, progress cardiovascular endurance training; progress standing therex, progress volume of distance amb.    Consulted and Agree with Plan of Care Patient           Patient will benefit from skilled therapeutic intervention in order to improve the following deficits and impairments:  Abnormal gait,Decreased endurance,Cardiopulmonary status limiting activity,Decreased activity tolerance,Decreased strength,Decreased balance,Decreased mobility,Difficulty walking,Improper body mechanics,Decreased safety awareness  Visit Diagnosis: Abnormality of gait and mobility  Difficulty in walking, not elsewhere classified  Muscle weakness (generalized)  Other lack of coordination     Problem List Patient Active Problem List   Diagnosis Date Noted  . TIA  (transient ischemic attack) 02/03/2020  . Aphasia   . Acute bronchitis 10/06/2016    Lenda Kelp, PT 11/29/2020, 5:28 PM  [facility] MAIN [facility] SERVICES [address] [place], [place], [address] Phone: [phone]   Fax:  [phone]  Name: JAMESINA GAUGH MRN: [id number] Date of Birth: [birth year]

## 2020-12-01 ENCOUNTER — Encounter: Payer: Medicare Other | Admitting: Occupational Therapy

## 2020-12-01 ENCOUNTER — Ambulatory Visit: Payer: Medicare Other

## 2020-12-06 ENCOUNTER — Ambulatory Visit: Payer: Medicare Other

## 2020-12-06 ENCOUNTER — Encounter: Payer: Medicare Other | Admitting: Occupational Therapy

## 2020-12-06 ENCOUNTER — Other Ambulatory Visit: Payer: Self-pay

## 2020-12-06 DIAGNOSIS — R269 Unspecified abnormalities of gait and mobility: Secondary | ICD-10-CM | POA: Diagnosis not present

## 2020-12-06 DIAGNOSIS — M6281 Muscle weakness (generalized): Secondary | ICD-10-CM

## 2020-12-06 DIAGNOSIS — R262 Difficulty in walking, not elsewhere classified: Secondary | ICD-10-CM

## 2020-12-06 DIAGNOSIS — R278 Other lack of coordination: Secondary | ICD-10-CM

## 2020-12-06 NOTE — Therapy (Signed)
[facility] MAIN [facility] SERVICES [address] [place], [place], [address] Phone: [phone]   Fax:  [phone]  Physical Therapy Treatment  Patient Details  Name: Kara Pennington MRN: [id number] Date of Birth: 21-May-1941 Referring Provider (PT): Dr. Lanny Cramp   Encounter Date: 12/06/2020   PT End of Session - 12/06/20 1109    Visit Number 24    Number of Visits 50    Date for PT Re-Evaluation 02/02/21    Authorization Type eval: 02/08; PN and Recert on 11/10/2020- 02/02/2021    PT Start Time 1101    PT Stop Time 1145    PT Time Calculation (min) 44 min    Equipment Utilized During Treatment Gait belt    Activity Tolerance Patient tolerated treatment well;Patient limited by fatigue    Behavior During Therapy [facility] for tasks assessed/performed           Past Medical History:  Diagnosis Date  . Anxiety   . Depression   . GERD (gastroesophageal reflux disease)   . Hypertension     Past Surgical History:  Procedure Laterality Date  . APPENDECTOMY    . PARATHYROIDECTOMY      There were no vitals filed for this visit.   Subjective Assessment - 12/06/20 1106    Subjective Patient reports having a good day yesterday- celebrating Memorial[facility] day at her facility. She reports also that she is sad as her brother passed away over the weekend.    Patient is accompained by: Family member    Pertinent History 80 y.o. female presents to skilled physical therapy with a history of COVID-19 infection 1 year ago, hypothyroidism, and fall on 07/15/2020 with resultant nondisplaced right temporal bone fracture with associated minimal left parietal subarachnoid hemorrhage and a resolved right frontotemporal subdural hematoma who presented to [facility] hospital on 07/23/20 with intermitten but worsening word finding difficulties and expressive aphasia. While in the hospital, she had approximately 24 hours of speaking "in gibberish" and unable to speak. After  about 1 day, it was much improved and she was transferred to [facility]. She normally lives in an independent living apartment. During the week, daughter stated that she was overall doing better though she still would mix up words. On the day of admit, the daughter spoke to her at around noon and she said they had a "normal" conversation. She arrived at [facility] to visit with her at 2:15 and immediately felt that her mother was not well. She was back to speaking non-sensically. Daughter requested return to [facility]. Upon arrival she was able to follow commands and answer some questions accurately, however, she was having considerable word finding difficulties and seemed somewhat frustrated. 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    Limitations Standing;Walking    How long can you sit comfortably? unlimited time    How long can you stand comfortably? less than 5 mins    How long can you walk comfortably? less than 5 mins    Patient Stated Goals "I want to be able to get back to church and rejoin the knitting club"    Currently in Pain? No/denies             Warm up - Nustep L0 UE/LE x 5 min and total= 0.19 mi  Seated UE and LE strengthening- Alternating extremities-  - march 3lb. X 12 reps - shoulder press 3lb. X 12 reps - LAQ 3lb. X 12  reps - Hip flex/abd 3lb x 12 reps - Chest press x 3lb x 12 reps - D2 PNF shoulder flex x 12 reps.  Step work in // bars= using BUE support- forward - up and over orange hurdle then 6" box, then up/over red hedgehog- turn around and repeat - down and back x length of bars.  Side step in // bars- over orange hurdle then 6" box then red hedgehog x length of bars x 2 trips  - High marches in //bars using YTB instruction to raise knee up toward height of bar- x 10 reps- Patient reported as "hard." Education provided throughout session via VC/TC and demonstration to facilitate movement at target  joints and correct muscle activation for all testing and exercises performed.   Patient later ambulated from gym back up to lobby using 4WW- requiring approx 4 min (including standing waiting on elevator) - Patient able to complete walk > 300 feet with forward flexed posture- leaning onto forearms - Preference per patient. No other significant gait abnormalities.    Clinical Impression: Patient continues to demo improved functional endurance with decreased rest breaks and able to tolerate progression of weight today without significant difficulty. She was able to follow verbal cues and visual demonstration for UE/LE exercises and complete all prescribed exercises well today. She will benefit from further skilled therapy to improve LE strength and to improve endurance/functional capacity to increase quality of life                        PT Education - 12/06/20 1108    Education Details specific exercise form    Person(s) Educated Patient    Methods Explanation;Demonstration;Tactile cues;Verbal cues    Comprehension Verbalized understanding;Returned demonstration;Verbal cues required;Tactile cues required;Need further instruction            PT Short Term Goals - 11/10/20 9379      PT SHORT TERM GOAL #1   Title Patient will be independent in home exercise program to improve strength/mobility for better functional independence with ADLs.    Baseline 09/26/2020- Patient requires cues for Breathing techniques with walking and reminders to use the BORG RPE scale. She is knowledgeable of seated LE strengthening but will continue to benefit from progression of HEP to more progressive exercises. 11/10/2020- Patient able to verbalize and demo good working knowledge of breathing technqiues, BORG Scale, and seated/standing therex along with walking program with no questions or concerns at this time.    Time 6    Period Weeks    Status Achieved    Target Date 09/27/20              PT Long Term Goals - 11/10/20 1157      PT LONG TERM GOAL #1   Title Patient will increase FOTO score to equal to or greater than to 61 demonstrate statistically significant improvement in mobility and quality of life.    Baseline 02/08: 45; 11/10/2020= 49    Time 12    Period Weeks    Status On-going    Target Date 02/02/21      PT LONG TERM GOAL #2   Title Patient (> 79 years old) will complete five times sit to stand test in < 15 seconds indicating an increased LE strength and improved balance.    Baseline 02/08: 19s without BUE support. 09/26/2020= 16.32 sec without UE support. 11/10/2020= 17.03 sec without UE support.    Time 12    Period  Weeks    Status On-going    Target Date 02/02/21      PT LONG TERM GOAL #3   Title Patient will increase 10 meter walk test to >1.44m/s as to improve gait speed for better community ambulation and to reduce fall risk.    Baseline 02/08:0.37 m/s. 09/26/2020= 0.59 m/s using 4WW. 11/10/2020= 0.75 m/s using 4WW    Time 12    Period Weeks    Status On-going    Target Date 02/02/21      PT LONG TERM GOAL #4   Title Patient will reduce timed up and go to <11 seconds to reduce fall risk and demonstrate improved transfer/gait ability.    Baseline 02/08: 33.43 with 2WW. 09/26/2020= 22.18 sec with 4WW; 11/10/2020=19.33 with 4WW    Time 12    Period Weeks    Status On-going    Target Date 02/02/21      PT LONG TERM GOAL #5   Title Patient will increase ABC scale score >80% to demonstrate better functional mobility and better confidence with ADLs.    Baseline 02/08: 48.25%; 11/10/2020= 53.13    Time 12    Period Weeks    Status On-going    Target Date 02/02/21      PT LONG TERM GOAL #6   Title Patient will demonstrate improved 6 min walk test from only able to complete 100 feet in  prior to stoppage from fatigue to completing test at 550 feet or more without rest break, reporting some difficulty or less to improve walking tolerance with community  ambulation including walking to dinining hall at independent living facility, grocery shopping, going to church,etc.    Baseline 09/26/2020= 100 feet in 1 min using 4WW- stopped due to fatigue. 11/10/2020= 555 feet with 4WW in 4 min 28 sec.    Time 12    Period Weeks    Status On-going    Target Date 02/02/21                 Plan - 12/06/20 1110    Clinical Impression Statement Patient continues to demo improved functional endurance with decreased rest breaks and able to tolerate progression of weight today without significant difficulty. She was able to follow verbal cues and visual demonstration for UE/LE exercises and complete all prescribed exercises well today. She will benefit from further skilled therapy to improve LE strength and to improve endurance/functional capacity to increase quality of life    Personal Factors and Comorbidities Age;Comorbidity 3+    Comorbidities TIA, aphasia, acute bronchitis    Examination-Activity Limitations Stairs;Squat;Bend;Stand;Transfers;Lift    Examination-Participation Restrictions Church    Stability/Clinical Decision Making Evolving/Moderate complexity    Rehab Potential Fair    PT Frequency 2x / week    PT Duration 12 weeks    PT Treatment/Interventions ADLs/Self Care Home Management;Canalith Repostioning;Moist Heat;Traction;Electrical Stimulation;Gait training;Stair training;Functional mobility training;Therapeutic activities;Therapeutic exercise;Balance training;Neuromuscular re-education;Patient/family education;Passive range of motion;Energy conservation    PT Next Visit Plan progress strengthening and balance exercises, progress cardiovascular endurance training; progress standing therex, progress volume of distance amb.    Consulted and Agree with Plan of Care Patient           Patient will benefit from skilled therapeutic intervention in order to improve the following deficits and impairments:  Abnormal gait,Decreased  endurance,Cardiopulmonary status limiting activity,Decreased activity tolerance,Decreased strength,Decreased balance,Decreased mobility,Difficulty walking,Improper body mechanics,Decreased safety awareness  Visit Diagnosis: Abnormality of gait and mobility  Difficulty in walking, not elsewhere classified  Muscle weakness (generalized)  Other lack of coordination     Problem List Patient Active Problem List   Diagnosis Date Noted  . TIA (transient ischemic attack) 02/03/2020  . Aphasia   . Acute bronchitis 10/06/2016    Lenda KelpJeffrey N Keva Darty, PT 12/06/2020, 12:03 PM  [facility] [address] [place], [place], [address] Phone: [phone]   Fax:  [phone]  Name: Kara Pennington MRN: [id number] Date of Birth: [birth year]

## 2020-12-08 ENCOUNTER — Ambulatory Visit: Payer: Medicare Other | Attending: Physical Medicine and Rehabilitation

## 2020-12-08 ENCOUNTER — Other Ambulatory Visit: Payer: Self-pay

## 2020-12-08 DIAGNOSIS — R278 Other lack of coordination: Secondary | ICD-10-CM | POA: Diagnosis present

## 2020-12-08 DIAGNOSIS — R262 Difficulty in walking, not elsewhere classified: Secondary | ICD-10-CM | POA: Diagnosis present

## 2020-12-08 DIAGNOSIS — M6281 Muscle weakness (generalized): Secondary | ICD-10-CM | POA: Diagnosis present

## 2020-12-08 DIAGNOSIS — R269 Unspecified abnormalities of gait and mobility: Secondary | ICD-10-CM

## 2020-12-08 NOTE — Therapy (Signed)
[facility] MAIN [facility] SERVICES [address] [place], [place], [address] Phone: [phone]   Fax:  [phone]  Physical Therapy Treatment  Patient Details  Name: Kara Pennington MRN: [id number] Date of Birth: 06-Jun-1941 Referring Provider (PT): Dr. Lanny Cramp   Encounter Date: 12/08/2020   PT End of Session - 12/08/20 1106    Visit Number 25    Number of Visits 50    Date for PT Re-Evaluation 02/02/21    Authorization Type eval: 02/08; PN and Recert on 11/10/2020- 02/02/2021    PT Start Time 1100    PT Stop Time 1147    PT Time Calculation (min) 47 min    Equipment Utilized During Treatment Gait belt    Activity Tolerance Patient tolerated treatment well;Patient limited by fatigue    Behavior During Therapy [facility] for tasks assessed/performed           Past Medical History:  Diagnosis Date  . Anxiety   . Depression   . GERD (gastroesophageal reflux disease)   . Hypertension     Past Surgical History:  Procedure Laterality Date  . APPENDECTOMY    . PARATHYROIDECTOMY      There were no vitals filed for this visit.   Subjective Assessment - 12/08/20 1104    Subjective Patient reports that she feels like she is having a flare up of Lupus. Reports just fatigued all the time.    Patient is accompained by: Family member    Pertinent History 80 y.o. female presents to skilled physical therapy with a history of COVID-19 infection 1 year ago, hypothyroidism, and fall on 07/15/2020 with resultant nondisplaced right temporal bone fracture with associated minimal left parietal subarachnoid hemorrhage and a resolved right frontotemporal subdural hematoma who presented to [facility] hospital on 07/23/20 with intermitten but worsening word finding difficulties and expressive aphasia. While in the hospital, she had approximately 24 hours of speaking "in gibberish" and unable to speak. After about 1 day, it was much improved and she was transferred to  [facility] and Rehab. She normally lives in an independent living apartment. During the week, daughter stated that she was overall doing better though she still would mix up words. On the day of admit, the daughter spoke to her at around noon and she said they had a "normal" conversation. She arrived at [facility] to visit with her at 2:15 and immediately felt that her mother was not well. She was back to speaking non-sensically. Daughter requested return to [facility]. Upon arrival she was able to follow commands and answer some questions accurately, however, she was having considerable word finding difficulties and seemed somewhat frustrated. 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    Limitations Standing;Walking    How long can you sit comfortably? unlimited time    How long can you stand comfortably? less than 5 mins    How long can you walk comfortably? less than 5 mins    Patient Stated Goals "I want to be able to get back to church and rejoin the knitting club"    Currently in Pain? No/denies           Warm up - Nustep L1 UE/LE x 6 min and total= 0.22 mi   Sit to stand (holding onto a PVC pipe and raising pipe overhead) x 5 reps x 2 sets.  - shoulder press 3lb. X 12 reps - Scap retraction with GTB 2 sets of 12  reps - Chest press x 3lb x 12 reps - D2 PNF shoulder flex  (standing with YTB) x 10 reps.  - High marches in //bars using GTB instruction to raise knee up toward height of bar- x 10 reps- Patient reported as "hard." Education provided throughout session via VC/TC and demonstration to facilitate movement at target joints and correct muscle activation for all testing and exercises performed.   Functional endurance walking from gym to elevator using 4WW with supervision (approx 180 feet) . Patient then rested for a min after getting off elevator and proceeded to walk down toward gift shop another 120 feet and back exhibiting  forward flexed trunk and short reciprocal steps.     Clinical Impression: Patient able to participate again in more UE and postural strengthening to assist with more upright walking. She continues to fatigue quickly but able to participate and complete all activities today without report of pain or significant difficulty other than fatigue. She will benefit from further skilled therapy to improve LE strength and to improve endurance/functional capacity to increase quality of life                           PT Education - 12/08/20 1105    Education Details Specific exercise form    Person(s) Educated Patient    Methods Explanation;Demonstration;Tactile cues;Verbal cues    Comprehension Verbalized understanding;Returned demonstration;Verbal cues required;Tactile cues required;Need further instruction            PT Short Term Goals - 11/10/20 3825      PT SHORT TERM GOAL #1   Title Patient will be independent in home exercise program to improve strength/mobility for better functional independence with ADLs.    Baseline 09/26/2020- Patient requires cues for Breathing techniques with walking and reminders to use the BORG RPE scale. She is knowledgeable of seated LE strengthening but will continue to benefit from progression of HEP to more progressive exercises. 11/10/2020- Patient able to verbalize and demo good working knowledge of breathing technqiues, BORG Scale, and seated/standing therex along with walking program with no questions or concerns at this time.    Time 6    Period Weeks    Status Achieved    Target Date 09/27/20             PT Long Term Goals - 11/10/20 1157      PT LONG TERM GOAL #1   Title Patient will increase FOTO score to equal to or greater than to 61 demonstrate statistically significant improvement in mobility and quality of life.    Baseline 02/08: 45; 11/10/2020= 49    Time 12    Period Weeks    Status On-going    Target Date 02/02/21       PT LONG TERM GOAL #2   Title Patient (> 13 years old) will complete five times sit to stand test in < 15 seconds indicating an increased LE strength and improved balance.    Baseline 02/08: 19s without BUE support. 09/26/2020= 16.32 sec without UE support. 11/10/2020= 17.03 sec without UE support.    Time 12    Period Weeks    Status On-going    Target Date 02/02/21      PT LONG TERM GOAL #3   Title Patient will increase 10 meter walk test to >1.11m/s as to improve gait speed for better community ambulation and to reduce fall risk.    Baseline 02/08:0.37 m/s. 09/26/2020= 0.59  m/s using 4WW. 11/10/2020= 0.75 m/s using 4WW    Time 12    Period Weeks    Status On-going    Target Date 02/02/21      PT LONG TERM GOAL #4   Title Patient will reduce timed up and go to <11 seconds to reduce fall risk and demonstrate improved transfer/gait ability.    Baseline 02/08: 33.43 with 2WW. 09/26/2020= 22.18 sec with 4WW; 11/10/2020=19.33 with 4WW    Time 12    Period Weeks    Status On-going    Target Date 02/02/21      PT LONG TERM GOAL #5   Title Patient will increase ABC scale score >80% to demonstrate better functional mobility and better confidence with ADLs.    Baseline 02/08: 48.25%; 11/10/2020= 53.13    Time 12    Period Weeks    Status On-going    Target Date 02/02/21      PT LONG TERM GOAL #6   Title Patient will demonstrate improved 6 min walk test from only able to complete 100 feet in  1min prior to stoppage from fatigue to completing test at 550 feet or more without rest break, reporting some difficulty or less to improve walking tolerance with community ambulation including walking to dinining hall at independent living facility, grocery shopping, going to church,etc.    Baseline 09/26/2020= 100 feet in 1 min using 4WW- stopped due to fatigue. 11/10/2020= 555 feet with 4WW in 4 min 28 sec.    Time 12    Period Weeks    Status On-going    Target Date 02/02/21                 Plan  - 12/08/20 1106    Clinical Impression Statement Patient able to participate again in more UE and postural strengthening to assist with more upright walking. She continues to fatigue quickly but able to participate and complete all activities today without report of pain or significant difficulty other than fatigue. She will benefit from further skilled therapy to improve LE strength and to improve endurance/functional capacity to increase quality of life    Personal Factors and Comorbidities Age;Comorbidity 3+    Comorbidities TIA, aphasia, acute bronchitis    Examination-Activity Limitations Stairs;Squat;Bend;Stand;Transfers;Lift    Examination-Participation Restrictions Church    Stability/Clinical Decision Making Evolving/Moderate complexity    Rehab Potential Fair    PT Frequency 2x / week    PT Duration 12 weeks    PT Treatment/Interventions ADLs/Self Care Home Management;Canalith Repostioning;Moist Heat;Traction;Electrical Stimulation;Gait training;Stair training;Functional mobility training;Therapeutic activities;Therapeutic exercise;Balance training;Neuromuscular re-education;Patient/family education;Passive range of motion;Energy conservation    PT Next Visit Plan progress strengthening and balance exercises, progress cardiovascular endurance training; progress standing therex, progress volume of distance amb.    Consulted and Agree with Plan of Care Patient           Patient will benefit from skilled therapeutic intervention in order to improve the following deficits and impairments:  Abnormal gait,Decreased endurance,Cardiopulmonary status limiting activity,Decreased activity tolerance,Decreased strength,Decreased balance,Decreased mobility,Difficulty walking,Improper body mechanics,Decreased safety awareness  Visit Diagnosis: Abnormality of gait and mobility  Difficulty in walking, not elsewhere classified  Muscle weakness (generalized)     Problem List Patient Active  Problem List   Diagnosis Date Noted  . TIA (transient ischemic attack) 02/03/2020  . Aphasia   . Acute bronchitis 10/06/2016    Lenda KelpJeffrey N Caylin Raby, PT 12/08/2020, 12:07 PM  [place] [facility]IN [facility] [address]  [place], [place], [address] Phone: [phone]   Fax:  [phone]  Name: Kara Pennington MRN: [id number] Date of Birth: Aug 06, 1940

## 2020-12-12 ENCOUNTER — Ambulatory Visit: Payer: Medicare Other

## 2020-12-12 ENCOUNTER — Encounter: Payer: Medicare Other | Admitting: Occupational Therapy

## 2020-12-13 ENCOUNTER — Ambulatory Visit: Payer: Medicare Other

## 2020-12-14 ENCOUNTER — Ambulatory Visit: Payer: Medicare Other

## 2020-12-14 ENCOUNTER — Encounter: Payer: Medicare Other | Admitting: Occupational Therapy

## 2020-12-16 ENCOUNTER — Ambulatory Visit: Payer: Medicare Other

## 2020-12-19 ENCOUNTER — Encounter: Payer: Medicare Other | Admitting: Occupational Therapy

## 2020-12-19 ENCOUNTER — Ambulatory Visit: Payer: Medicare Other

## 2020-12-20 ENCOUNTER — Ambulatory Visit: Payer: Medicare Other

## 2020-12-20 ENCOUNTER — Other Ambulatory Visit: Payer: Self-pay

## 2020-12-20 DIAGNOSIS — M6281 Muscle weakness (generalized): Secondary | ICD-10-CM

## 2020-12-20 DIAGNOSIS — R278 Other lack of coordination: Secondary | ICD-10-CM

## 2020-12-20 DIAGNOSIS — R269 Unspecified abnormalities of gait and mobility: Secondary | ICD-10-CM | POA: Diagnosis not present

## 2020-12-20 DIAGNOSIS — R262 Difficulty in walking, not elsewhere classified: Secondary | ICD-10-CM

## 2020-12-20 NOTE — Therapy (Signed)
[place] [facility] MAIN [facility] SERVICES [address] [place], [place], [address] Phone: [phone]   Fax:  [phone]  Physical Therapy Treatment  Patient Details  Name: Kara Pennington MRN: [id number] Date of Birth: [birth year] Referring Provider (PT): Dr. Lanny Cramp   Encounter Date: 12/20/2020   PT End of Session - 12/20/20 1617     Visit Number 26    Number of Visits 50    Date for PT Re-Evaluation 02/02/21    Authorization Type eval: 02/08; PN and Recert on 11/10/2020- 02/02/2021    PT Start Time 1605    PT Stop Time 1646    PT Time Calculation (min) 41 min    Equipment Utilized During Treatment Gait belt    Activity Tolerance Patient tolerated treatment well;Patient limited by fatigue    Behavior During Therapy WFL for tasks assessed/performed             Past Medical History:  Diagnosis Date   Anxiety    Depression    GERD (gastroesophageal reflux disease)    Hypertension     Past Surgical History:  Procedure Laterality Date   APPENDECTOMY     PARATHYROIDECTOMY      There were no vitals filed for this visit.   Subjective Assessment - 12/20/20 1614     Subjective Patient reports she is tired today as she went out to eat with friends for lunch today.    Patient is accompained by: Family member    Pertinent History 80 y.o. female presents to skilled physical therapy with a history of COVID-19 infection 1 year ago, hypothyroidism, and fall on 07/15/2020 with resultant nondisplaced right temporal bone fracture with associated minimal left parietal subarachnoid hemorrhage and a resolved right frontotemporal subdural hematoma who presented to [facility] hospital on 07/23/20 with intermitten but worsening word finding difficulties and expressive aphasia. While in the hospital, she had approximately 24 hours of speaking "in gibberish" and unable to speak. After about 1 day, it was much improved and she was transferred to [facility]  and Rehab. She normally lives in an independent living apartment. During the week, daughter stated that she was overall doing better though she still would mix up words. On the day of admit, the daughter spoke to her at around noon and she said they had a "normal" conversation. She arrived at [facility] to visit with her at 2:15 and immediately felt that her mother was not well. She was back to speaking non-sensically. Daughter requested return to [facility]. Upon arrival she was able to follow commands and answer some questions accurately, however, she was having considerable word finding difficulties and seemed somewhat frustrated. 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    Limitations Standing;Walking    How long can you sit comfortably? unlimited time    How long can you stand comfortably? less than 5 mins    How long can you walk comfortably? less than 5 mins    Patient Stated Goals "I want to be able to get back to church and rejoin the knitting club"    Currently in Pain? No/denies               Interventions:   Seated hip march 2.5 lb. BLE- x 15 reps - patient reports as medium  Seated knee ext 2.5 lb. BLE- x 15 reps - rates as hard  Step tap onto 1st step with BUE on railings x 10 reps  x 2 sets using 2.5 lb- Patient demo good technique early in reps yet unable to hold up posture and using forearms for last set  Side step over line left to right then back to left using 2.5 lb AW- 10 reps each direction using BUE support- patient reports fatigued yet no significant difficulty performing.   Step up onto purple pad 2.5 lb 2 sets of 5 reps each LE. - Patient presents with increased difficulty clearing both LE up onto pad- no loss of balance but did exhibit undo fatigue.   Seated ham curls with GTB x 10 reps each LE- Patient rated as medium  Resistive DF with GTB x 10 reps each LE- Paitent rated as medium. Education provided throughout  session via VC/TC and demonstration to facilitate movement at target joints and correct muscle activation for all testing and exercises performed.   Gait activity: Patient ambulated approx 200 feet x 1 trial and another 80 feet on 2nd trial using 4 WW with CGA and close w/c follow. Patient presented with increased overall fatigue upon completion.   Clinical Impression: Patient presents overall with increased fatigue with standing and seated resistive LE strengthening activities. She is able to recover to continue onto next activity yet very limited with overall standing and walking tolerance today. She will benefit from further skilled therapy to improve LE strength and to improve endurance/functional capacity to increase quality of life                          PT Education - 12/20/20 1615     Education Details specific exercise form    Person(s) Educated Patient    Methods Explanation;Demonstration;Tactile cues;Verbal cues    Comprehension Verbalized understanding;Returned demonstration;Tactile cues required;Verbal cues required;Need further instruction              PT Short Term Goals - 11/10/20 1751       PT SHORT TERM GOAL #1   Title Patient will be independent in home exercise program to improve strength/mobility for better functional independence with ADLs.    Baseline 09/26/2020- Patient requires cues for Breathing techniques with walking and reminders to use the BORG RPE scale. She is knowledgeable of seated LE strengthening but will continue to benefit from progression of HEP to more progressive exercises. 11/10/2020- Patient able to verbalize and demo good working knowledge of breathing technqiues, BORG Scale, and seated/standing therex along with walking program with no questions or concerns at this time.    Time 6    Period Weeks    Status Achieved    Target Date 09/27/20               PT Long Term Goals - 11/10/20 1157       PT LONG TERM GOAL  #1   Title Patient will increase FOTO score to equal to or greater than to 61 demonstrate statistically significant improvement in mobility and quality of life.    Baseline 02/08: 45; 11/10/2020= 49    Time 12    Period Weeks    Status On-going    Target Date 02/02/21      PT LONG TERM GOAL #2   Title Patient (> 28 years old) will complete five times sit to stand test in < 15 seconds indicating an increased LE strength and improved balance.    Baseline 02/08: 19s without BUE support. 09/26/2020= 16.32 sec without UE support. 11/10/2020= 17.03 sec without UE support.  Time 12    Period Weeks    Status On-going    Target Date 02/02/21      PT LONG TERM GOAL #3   Title Patient will increase 10 meter walk test to >1.7973m/s as to improve gait speed for better community ambulation and to reduce fall risk.    Baseline 02/08:0.37 m/s. 09/26/2020= 0.59 m/s using 4WW. 11/10/2020= 0.75 m/s using 4WW    Time 12    Period Weeks    Status On-going    Target Date 02/02/21      PT LONG TERM GOAL #4   Title Patient will reduce timed up and go to <11 seconds to reduce fall risk and demonstrate improved transfer/gait ability.    Baseline 02/08: 33.43 with 2WW. 09/26/2020= 22.18 sec with 4WW; 11/10/2020=19.33 with 4WW    Time 12    Period Weeks    Status On-going    Target Date 02/02/21      PT LONG TERM GOAL #5   Title Patient will increase ABC scale score >80% to demonstrate better functional mobility and better confidence with ADLs.    Baseline 02/08: 48.25%; 11/10/2020= 53.13    Time 12    Period Weeks    Status On-going    Target Date 02/02/21      PT LONG TERM GOAL #6   Title Patient will demonstrate improved 6 min walk test from only able to complete 100 feet in  1min prior to stoppage from fatigue to completing test at 550 feet or more without rest break, reporting some difficulty or less to improve walking tolerance with community ambulation including walking to dinining hall at independent living  facility, grocery shopping, going to church,etc.    Baseline 09/26/2020= 100 feet in 1 min using 4WW- stopped due to fatigue. 11/10/2020= 555 feet with 4WW in 4 min 28 sec.    Time 12    Period Weeks    Status On-going    Target Date 02/02/21                   Plan - 12/20/20 1618     Clinical Impression Statement Patient presents overall with increased fatigue with standing and seated resistive LE strengthening activities. She is able to recover to continue onto next activity yet very limited with overall standing and walking tolerance today. She will benefit from further skilled therapy to improve LE strength and to improve endurance/functional capacity to increase quality of life    Personal Factors and Comorbidities Age;Comorbidity 3+    Comorbidities TIA, aphasia, acute bronchitis    Examination-Activity Limitations Stairs;Squat;Bend;Stand;Transfers;Lift    Examination-Participation Restrictions Church    Stability/Clinical Decision Making Evolving/Moderate complexity    Rehab Potential Fair    PT Frequency 2x / week    PT Duration 12 weeks    PT Treatment/Interventions ADLs/Self Care Home Management;Canalith Repostioning;Moist Heat;Traction;Electrical Stimulation;Gait training;Stair training;Functional mobility training;Therapeutic activities;Therapeutic exercise;Balance training;Neuromuscular re-education;Patient/family education;Passive range of motion;Energy conservation    PT Next Visit Plan progress strengthening and balance exercises, progress cardiovascular endurance training; progress standing therex, progress volume of distance amb.    Consulted and Agree with Plan of Care Patient             Patient will benefit from skilled therapeutic intervention in order to improve the following deficits and impairments:  Abnormal gait, Decreased endurance, Cardiopulmonary status limiting activity, Decreased activity tolerance, Decreased strength, Decreased balance, Decreased  mobility, Difficulty walking, Improper body mechanics, Decreased safety awareness  Visit Diagnosis:  Abnormality of gait and mobility  Difficulty in walking, not elsewhere classified  Muscle weakness (generalized)  Other lack of coordination     Problem List Patient Active Problem List   Diagnosis Date Noted   TIA (transient ischemic attack) 02/03/2020   Aphasia    Acute bronchitis 10/06/2016    Lenda Kelp, PT 12/21/2020, 4:03 PM  [place] [facility] MAIN [facility] SERVICES [address] [place], [place], [address] Phone: [phone]   Fax:  [phone]  Name: Kara Pennington MRN: [id number] Date of Birth: 1940-07-28

## 2020-12-21 ENCOUNTER — Ambulatory Visit: Payer: Medicare Other

## 2020-12-21 ENCOUNTER — Encounter: Payer: Medicare Other | Admitting: Occupational Therapy

## 2020-12-26 ENCOUNTER — Ambulatory Visit: Payer: Medicare Other

## 2020-12-26 ENCOUNTER — Encounter: Payer: Medicare Other | Admitting: Occupational Therapy

## 2020-12-28 ENCOUNTER — Encounter: Payer: Medicare Other | Admitting: Occupational Therapy

## 2020-12-28 ENCOUNTER — Other Ambulatory Visit: Payer: Self-pay

## 2020-12-28 ENCOUNTER — Ambulatory Visit: Payer: Medicare Other

## 2020-12-28 DIAGNOSIS — R269 Unspecified abnormalities of gait and mobility: Secondary | ICD-10-CM

## 2020-12-28 DIAGNOSIS — M6281 Muscle weakness (generalized): Secondary | ICD-10-CM

## 2020-12-28 DIAGNOSIS — R262 Difficulty in walking, not elsewhere classified: Secondary | ICD-10-CM

## 2020-12-28 DIAGNOSIS — R278 Other lack of coordination: Secondary | ICD-10-CM

## 2020-12-28 NOTE — Therapy (Signed)
[facility] MAIN [facility] SERVICES [address] [place], [place], [address] Phone: [phone]   Fax:  [phone]  Physical Therapy Treatment  Patient Details  Name: Kara Pennington MRN: [id number] Date of Birth: [birth year] Referring Provider (PT): Dr. Lanny Cramp   Encounter Date: 12/28/2020   PT End of Session - 12/28/20 0901     Visit Number 27    Number of Visits 50    Date for PT Re-Evaluation 02/02/21    Authorization Type eval: 02/08; PN and Recert on 11/10/2020- 02/02/2021    PT Start Time 0855    PT Stop Time 0930    PT Time Calculation (min) 35 min    Equipment Utilized During Treatment Gait belt    Activity Tolerance Patient tolerated treatment well;Patient limited by fatigue    Behavior During Therapy WFL for tasks assessed/performed             Past Medical History:  Diagnosis Date   Anxiety    Depression    GERD (gastroesophageal reflux disease)    Hypertension     Past Surgical History:  Procedure Laterality Date   APPENDECTOMY     PARATHYROIDECTOMY      There were no vitals filed for this visit.   Subjective Assessment - 12/28/20 0859     Subjective Patient reports she more tired these day and coming to therapy is getting harder as her transportation has been cut by 1 day per her report.    Patient is accompained by: Family member    Pertinent History 80 y.o. female presents to skilled physical therapy with a history of COVID-19 infection 1 year ago, hypothyroidism, and fall on 07/15/2020 with resultant nondisplaced right temporal bone fracture with associated minimal left parietal subarachnoid hemorrhage and a resolved right frontotemporal subdural hematoma who presented to [facility] hospital on 07/23/20 with intermitten but worsening word finding difficulties and expressive aphasia. While in the hospital, she had approximately 24 hours of speaking "in gibberish" and unable to speak. After about 1 day, it was much  improved and she was transferred to [facility]. She normally lives in an independent living apartment. During the week, daughter stated that she was overall doing better though she still would mix up words. On the day of admit, the daughter spoke to her at around noon and she said they had a "normal" conversation. She arrived at [facility] to visit with her at 2:15 and immediately felt that her mother was not well. She was back to speaking non-sensically. Daughter requested return to [facility]. Upon arrival she was able to follow commands and answer some questions accurately, however, she was having considerable word finding difficulties and seemed somewhat frustrated. 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    Limitations Standing;Walking    How long can you sit comfortably? unlimited time    How long can you stand comfortably? less than 5 mins    How long can you walk comfortably? less than 5 mins    Patient Stated Goals "I want to be able to get back to church and rejoin the knitting club"    Currently in Pain? No/denies              Nustep L0 UE/LE - 0.16 mi. X 5 min total.   Review of Home program as patient unsure how much she can continue to come to PT.   THEREX at support bar:  Standing hip  march Stand hip ext  Stand hip abd Stand knee flex Stand mini squat Stand calf raises Stand toe raises Sit to stand - Minimal UE support.  10 reps for each exercise- Min VC for review of technique including performing slow and controlled with 2 sec hold and to count out loud for proper breathing. * Patient endorses forward flexed trunk posture and required brief rest break between each exercise today. She was able to recover and continue with encouragement.   Gait- Patient ambulated around 170 feet with use of 4WW-Supervision- able to start off in erect/upright position yet quickly switched to more forearm support after about 75  Feet. Patient limited secondary to fatigue today.   Clinical Impression: Patient has been reporting increased fatigue over past couple of weeks and presents with increased overall fatigue this week as well. Unable to progress functional endurance activities but did complete review of Home program with patient presenting with good verbal understanding and able to return appropriate demonstration. She will benefit from further skilled therapy to improve LE strength and to improve endurance/functional capacity to increase quality of life                         PT Education - 12/28/20 0900     Education Details Home program review.    Person(s) Educated Patient    Methods Explanation;Demonstration;Tactile cues;Verbal cues    Comprehension Verbalized understanding;Returned demonstration;Verbal cues required;Need further instruction;Tactile cues required              PT Short Term Goals - 11/10/20 0609       PT SHORT TERM GOAL #1   Title Patient will be independent in home exercise program to improve strength/mobility for better functional independence with ADLs.    Baseline 09/26/2020- Patient requires cues for Breathing techniques with walking and reminders to use the BORG RPE scale. She is knowledgeable of seated LE strengthening but will continue to benefit from progression of HEP to more progressive exercises. 11/10/2020- Patient able to verbalize and demo good working knowledge of breathing technqiues, BORG Scale, and seated/standing therex along with walking program with no questions or concerns at this time.    Time 6    Period Weeks    Status Achieved    Target Date 09/27/20               PT Long Term Goals - 11/10/20 1157       PT LONG TERM GOAL #1   Title Patient will increase FOTO score to equal to or greater than to 61 demonstrate statistically significant improvement in mobility and quality of life.    Baseline 02/08: 45; 11/10/2020= 49    Time 12     Period Weeks    Status On-going    Target Date 02/02/21      PT LONG TERM GOAL #2   Title Patient (> 18 years old) will complete five times sit to stand test in < 15 seconds indicating an increased LE strength and improved balance.    Baseline 02/08: 19s without BUE support. 09/26/2020= 16.32 sec without UE support. 11/10/2020= 17.03 sec without UE support.    Time 12    Period Weeks    Status On-going    Target Date 02/02/21      PT LONG TERM GOAL #3   Title Patient will increase 10 meter walk test to >1.23m/s as to improve gait speed for better community ambulation and to reduce fall  risk.    Baseline 02/08:0.37 m/s. 09/26/2020= 0.59 m/s using 4WW. 11/10/2020= 0.75 m/s using 4WW    Time 12    Period Weeks    Status On-going    Target Date 02/02/21      PT LONG TERM GOAL #4   Title Patient will reduce timed up and go to <11 seconds to reduce fall risk and demonstrate improved transfer/gait ability.    Baseline 02/08: 33.43 with 2WW. 09/26/2020= 22.18 sec with 4WW; 11/10/2020=19.33 with 4WW    Time 12    Period Weeks    Status On-going    Target Date 02/02/21      PT LONG TERM GOAL #5   Title Patient will increase ABC scale score >80% to demonstrate better functional mobility and better confidence with ADLs.    Baseline 02/08: 48.25%; 11/10/2020= 53.13    Time 12    Period Weeks    Status On-going    Target Date 02/02/21      PT LONG TERM GOAL #6   Title Patient will demonstrate improved 6 min walk test from only able to complete 100 feet in  1min prior to stoppage from fatigue to completing test at 550 feet or more without rest break, reporting some difficulty or less to improve walking tolerance with community ambulation including walking to dinining hall at independent living facility, grocery shopping, going to church,etc.    Baseline 09/26/2020= 100 feet in 1 min using 4WW- stopped due to fatigue. 11/10/2020= 555 feet with 4WW in 4 min 28 sec.    Time 12    Period Weeks    Status  On-going    Target Date 02/02/21                   Plan - 12/28/20 0901     Clinical Impression Statement Patient has been reporting increased fatigue over past couple of weeks and presents with increased overall fatigue this week as well. Unable to progress functional endurance activities but did complete review of Home program with patient presenting with good verbal understanding and able to return appropriate demonstration. She will benefit from further skilled therapy to improve LE strength and to improve endurance/functional capacity to increase quality of life    Personal Factors and Comorbidities Age;Comorbidity 3+    Comorbidities TIA, aphasia, acute bronchitis    Examination-Activity Limitations Stairs;Squat;Bend;Stand;Transfers;Lift    Examination-Participation Restrictions Church    Stability/Clinical Decision Making Evolving/Moderate complexity    Rehab Potential Fair    PT Frequency 2x / week    PT Duration 12 weeks    PT Treatment/Interventions ADLs/Self Care Home Management;Canalith Repostioning;Moist Heat;Traction;Electrical Stimulation;Gait training;Stair training;Functional mobility training;Therapeutic activities;Therapeutic exercise;Balance training;Neuromuscular re-education;Patient/family education;Passive range of motion;Energy conservation    PT Next Visit Plan progress strengthening and balance exercises, progress cardiovascular endurance training; progress standing therex, progress volume of distance amb.    PT Home Exercise Plan Reviewed LE strengthening in standing today for HEP.    Consulted and Agree with Plan of Care Patient             Patient will benefit from skilled therapeutic intervention in order to improve the following deficits and impairments:  Abnormal gait, Decreased endurance, Cardiopulmonary status limiting activity, Decreased activity tolerance, Decreased strength, Decreased balance, Decreased mobility, Difficulty walking, Improper  body mechanics, Decreased safety awareness  Visit Diagnosis: Abnormality of gait and mobility  Difficulty in walking, not elsewhere classified  Muscle weakness (generalized)  Other lack of coordination  Problem List Patient Active Problem List   Diagnosis Date Noted   TIA (transient ischemic attack) 02/03/2020   Aphasia    Acute bronchitis 10/06/2016    Lenda Kelp, PT 12/28/2020, 2:19 PM  [facility] MAIN [facility] SERVICES [address] [place], [place], [address] Phone: [phone]   Fax:  [phone]  Name: Kara Pennington MRN: [id number] Date of Birth: [birth year]

## 2020-12-30 ENCOUNTER — Ambulatory Visit: Payer: Medicare Other

## 2021-01-02 ENCOUNTER — Encounter: Payer: Medicare Other | Admitting: Occupational Therapy

## 2021-01-02 ENCOUNTER — Ambulatory Visit: Payer: Medicare Other

## 2021-01-04 ENCOUNTER — Ambulatory Visit: Payer: Medicare Other

## 2021-01-04 ENCOUNTER — Encounter: Payer: Medicare Other | Admitting: Occupational Therapy

## 2021-01-05 ENCOUNTER — Ambulatory Visit: Payer: Medicare Other

## 2021-01-05 ENCOUNTER — Other Ambulatory Visit: Payer: Self-pay

## 2021-01-05 DIAGNOSIS — R278 Other lack of coordination: Secondary | ICD-10-CM

## 2021-01-05 DIAGNOSIS — R269 Unspecified abnormalities of gait and mobility: Secondary | ICD-10-CM

## 2021-01-05 DIAGNOSIS — M6281 Muscle weakness (generalized): Secondary | ICD-10-CM

## 2021-01-05 DIAGNOSIS — R262 Difficulty in walking, not elsewhere classified: Secondary | ICD-10-CM

## 2021-01-05 NOTE — Therapy (Signed)
[facility] MAIN [facility] SERVICES [address] [place], [place], [address] Phone: [phone]   Fax:  [phone]  Physical Therapy Treatment  Patient Details  Name: Kara Pennington MRN: [id number] Date of Birth: 11/19/1940 Referring Provider (PT): Dr. Lanny Cramp   Encounter Date: 01/05/2021   PT End of Session - 01/05/21 0939     Visit Number 28    Number of Visits 50    Date for PT Re-Evaluation 02/02/21    Authorization Type eval: 02/08; PN and Recert on 11/10/2020- 02/02/2021    PT Start Time 0930    PT Stop Time 1015    PT Time Calculation (min) 45 min    Equipment Utilized During Treatment Gait belt    Activity Tolerance Patient tolerated treatment well;Patient limited by fatigue    Behavior During Therapy WFL for tasks assessed/performed             Past Medical History:  Diagnosis Date   Anxiety    Depression    GERD (gastroesophageal reflux disease)    Hypertension     Past Surgical History:  Procedure Laterality Date   APPENDECTOMY     PARATHYROIDECTOMY      There were no vitals filed for this visit.   Subjective Assessment - 01/05/21 0937     Subjective Patient reports feeling sleepy today-limited on energy.    Patient is accompained by: Family member    Pertinent History 80 y.o. female presents to skilled physical therapy with a history of COVID-19 infection 1 year ago, hypothyroidism, and fall on 07/15/2020 with resultant nondisplaced right temporal bone fracture with associated minimal left parietal subarachnoid hemorrhage and a resolved right frontotemporal subdural hematoma who presented to [facility] hospital on 07/23/20 with intermitten but worsening word finding difficulties and expressive aphasia. While in the hospital, she had approximately 24 hours of speaking "in gibberish" and unable to speak. After about 1 day, it was much improved and she was transferred to [facility]. She normally lives in  an independent living apartment. During the week, daughter stated that she was overall doing better though she still would mix up words. On the day of admit, the daughter spoke to her at around noon and she said they had a "normal" conversation. She arrived at [facility] to visit with her at 2:15 and immediately felt that her mother was not well. She was back to speaking non-sensically. Daughter requested return to [facility]. Upon arrival she was able to follow commands and answer some questions accurately, however, she was having considerable word finding difficulties and seemed somewhat frustrated. 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    Limitations Standing;Walking    How long can you sit comfortably? unlimited time    How long can you stand comfortably? less than 5 mins    How long can you walk comfortably? less than 5 mins    Patient Stated Goals "I want to be able to get back to church and rejoin the knitting club"    Currently in Pain? No/denies             Interventions:  THEREX:  Seated hip march for 1 min- 15 reps each LE with 3lb.  Seated knee ext for 1 min- 14 reps each LE with 3lb.  Seated hip flex/abd up and over cone BLE for 1 min- 15 reps without any resist * difficulty with right LE - max effort.  Seated hamstring  curl with GTB x 1 min on right- 23 rep and 1 min on right =18 reps (but completed 23 reps to finish to equal left LE).   Sit to stand x 10 reps without UE support -Increased time yet patient able to perform without significant difficulty.    step up onto 1st step with B UE support x 1 min (13 total steps up 7 on left and 6 on right)  Education provided throughout session via VC/TC and demonstration to facilitate movement at target joints and correct muscle activation for all testing and exercises performed.   Patient ambulated 200 feet with four wheeled walker with supervision followed by another 80 feet from  gym back up to lobby entrance at main level. Patient able to walk the 1st 150 feet with hands on handles then utilized forearms for support.   Clinical Impression:  Patient able to push herself and perform 1 min of therex activities without stopping today including adding some ankle weights and theraband for resistance. She was fatigued at end of session but still able to continue and walk back up to entrance without significant difficulty requiring brief rest at elevator (less than 1 min). She will benefit from further skilled therapy to improve LE strength and to improve endurance/functional capacity to increase quality of life                          PT Education - 01/05/21 0938     Education Details specific exercise technique    Person(s) Educated Patient    Methods Explanation;Demonstration;Tactile cues;Verbal cues    Comprehension Verbalized understanding;Returned demonstration;Verbal cues required              PT Short Term Goals - 11/10/20 0609       PT SHORT TERM GOAL #1   Title Patient will be independent in home exercise program to improve strength/mobility for better functional independence with ADLs.    Baseline 09/26/2020- Patient requires cues for Breathing techniques with walking and reminders to use the BORG RPE scale. She is knowledgeable of seated LE strengthening but will continue to benefit from progression of HEP to more progressive exercises. 11/10/2020- Patient able to verbalize and demo good working knowledge of breathing technqiues, BORG Scale, and seated/standing therex along with walking program with no questions or concerns at this time.    Time 6    Period Weeks    Status Achieved    Target Date 09/27/20               PT Long Term Goals - 11/10/20 1157       PT LONG TERM GOAL #1   Title Patient will increase FOTO score to equal to or greater than to 61 demonstrate statistically significant improvement in mobility and quality  of life.    Baseline 02/08: 45; 11/10/2020= 49    Time 12    Period Weeks    Status On-going    Target Date 02/02/21      PT LONG TERM GOAL #2   Title Patient (> 23 years old) will complete five times sit to stand test in < 15 seconds indicating an increased LE strength and improved balance.    Baseline 02/08: 19s without BUE support. 09/26/2020= 16.32 sec without UE support. 11/10/2020= 17.03 sec without UE support.    Time 12    Period Weeks    Status On-going    Target Date 02/02/21  PT LONG TERM GOAL #3   Title Patient will increase 10 meter walk test to >1.546m/s as to improve gait speed for better community ambulation and to reduce fall risk.    Baseline 02/08:0.37 m/s. 09/26/2020= 0.59 m/s using 4WW. 11/10/2020= 0.75 m/s using 4WW    Time 12    Period Weeks    Status On-going    Target Date 02/02/21      PT LONG TERM GOAL #4   Title Patient will reduce timed up and go to <11 seconds to reduce fall risk and demonstrate improved transfer/gait ability.    Baseline 02/08: 33.43 with 2WW. 09/26/2020= 22.18 sec with 4WW; 11/10/2020=19.33 with 4WW    Time 12    Period Weeks    Status On-going    Target Date 02/02/21      PT LONG TERM GOAL #5   Title Patient will increase ABC scale score >80% to demonstrate better functional mobility and better confidence with ADLs.    Baseline 02/08: 48.25%; 11/10/2020= 53.13    Time 12    Period Weeks    Status On-going    Target Date 02/02/21      PT LONG TERM GOAL #6   Title Patient will demonstrate improved 6 min walk test from only able to complete 100 feet in  1min prior to stoppage from fatigue to completing test at 550 feet or more without rest break, reporting some difficulty or less to improve walking tolerance with community ambulation including walking to dinining hall at independent living facility, grocery shopping, going to church,etc.    Baseline 09/26/2020= 100 feet in 1 min using 4WW- stopped due to fatigue. 11/10/2020= 555 feet with 4WW  in 4 min 28 sec.    Time 12    Period Weeks    Status On-going    Target Date 02/02/21                   Plan - 01/05/21 0940     Clinical Impression Statement Patient able to push herself and perform 1 min of therex activities without stopping today including adding some ankle weights and theraband for resistance. She was fatigued at end of session but still able to continue and walk back up to entrance without significant difficulty requiring brief rest at elevator (less than 1 min). She will benefit from further skilled therapy to improve LE strength and to improve endurance/functional capacity to increase quality of life    Personal Factors and Comorbidities Age;Comorbidity 3+    Comorbidities TIA, aphasia, acute bronchitis    Examination-Activity Limitations Stairs;Squat;Bend;Stand;Transfers;Lift    Examination-Participation Restrictions Church    Stability/Clinical Decision Making Evolving/Moderate complexity    Rehab Potential Fair    PT Frequency 2x / week    PT Duration 12 weeks    PT Treatment/Interventions ADLs/Self Care Home Management;Canalith Repostioning;Moist Heat;Traction;Electrical Stimulation;Gait training;Stair training;Functional mobility training;Therapeutic activities;Therapeutic exercise;Balance training;Neuromuscular re-education;Patient/family education;Passive range of motion;Energy conservation    PT Next Visit Plan progress strengthening and balance exercises, progress cardiovascular endurance training; progress standing therex, progress volume of distance amb.    Consulted and Agree with Plan of Care Patient             Patient will benefit from skilled therapeutic intervention in order to improve the following deficits and impairments:  Abnormal gait, Decreased endurance, Cardiopulmonary status limiting activity, Decreased activity tolerance, Decreased strength, Decreased balance, Decreased mobility, Difficulty walking, Improper body mechanics,  Decreased safety awareness  Visit Diagnosis: Abnormality of gait  and mobility  Difficulty in walking, not elsewhere classified  Muscle weakness (generalized)  Other lack of coordination     Problem List Patient Active Problem List   Diagnosis Date Noted   TIA (transient ischemic attack) 02/03/2020   Aphasia    Acute bronchitis 10/06/2016    Lenda Kelp, PT  01/05/2021, 10:29 AM  [place] [facility] MAIN [facility] SERVICES [address] [place], [place], [address] Phone: [phone]   Fax:  [phone]  Name: AIRIEL OBLINGER MRN: [id number] Date of Birth: May 10, 1941

## 2021-01-06 ENCOUNTER — Ambulatory Visit: Payer: Medicare Other

## 2021-01-10 ENCOUNTER — Ambulatory Visit: Payer: Medicare Other | Attending: Physical Medicine and Rehabilitation

## 2021-01-10 DIAGNOSIS — R2681 Unsteadiness on feet: Secondary | ICD-10-CM | POA: Insufficient documentation

## 2021-01-10 DIAGNOSIS — R269 Unspecified abnormalities of gait and mobility: Secondary | ICD-10-CM | POA: Insufficient documentation

## 2021-01-10 DIAGNOSIS — G459 Transient cerebral ischemic attack, unspecified: Secondary | ICD-10-CM | POA: Insufficient documentation

## 2021-01-10 DIAGNOSIS — M6281 Muscle weakness (generalized): Secondary | ICD-10-CM | POA: Insufficient documentation

## 2021-01-10 DIAGNOSIS — R262 Difficulty in walking, not elsewhere classified: Secondary | ICD-10-CM | POA: Insufficient documentation

## 2021-01-10 DIAGNOSIS — R278 Other lack of coordination: Secondary | ICD-10-CM | POA: Insufficient documentation

## 2021-01-10 DIAGNOSIS — R296 Repeated falls: Secondary | ICD-10-CM | POA: Insufficient documentation

## 2021-01-12 ENCOUNTER — Ambulatory Visit: Payer: Medicare Other

## 2021-01-16 ENCOUNTER — Ambulatory Visit: Payer: Medicare Other

## 2021-01-18 ENCOUNTER — Other Ambulatory Visit: Payer: Self-pay

## 2021-01-18 ENCOUNTER — Ambulatory Visit: Payer: Medicare Other | Admitting: Physical Therapy

## 2021-01-18 DIAGNOSIS — R296 Repeated falls: Secondary | ICD-10-CM | POA: Diagnosis present

## 2021-01-18 DIAGNOSIS — G459 Transient cerebral ischemic attack, unspecified: Secondary | ICD-10-CM | POA: Diagnosis present

## 2021-01-18 DIAGNOSIS — M6281 Muscle weakness (generalized): Secondary | ICD-10-CM | POA: Diagnosis present

## 2021-01-18 DIAGNOSIS — R262 Difficulty in walking, not elsewhere classified: Secondary | ICD-10-CM | POA: Diagnosis present

## 2021-01-18 DIAGNOSIS — R2681 Unsteadiness on feet: Secondary | ICD-10-CM

## 2021-01-18 DIAGNOSIS — R269 Unspecified abnormalities of gait and mobility: Secondary | ICD-10-CM

## 2021-01-18 DIAGNOSIS — R278 Other lack of coordination: Secondary | ICD-10-CM

## 2021-01-18 NOTE — Therapy (Signed)
[facility] MAIN [facility] SERVICES [address] [place], [place], [address] Phone: [phone]   Fax:  [phone]  Physical Therapy Treatment  Patient Details  Name: Kara Pennington MRN: [id number] Date of Birth: February 18, 1941 Referring Provider (PT): Dr. Lanny Cramp   Encounter Date: 01/18/2021   PT End of Session - 01/18/21 1256     Visit Number 29    Number of Visits 50    Date for PT Re-Evaluation 02/02/21    Authorization Type eval: 02/08; PN and Recert on 11/10/2020- 02/02/2021    PT Start Time 1100    PT Stop Time 1145    PT Time Calculation (min) 45 min    Equipment Utilized During Treatment Gait belt    Activity Tolerance Patient tolerated treatment well;Patient limited by fatigue    Behavior During Therapy WFL for tasks assessed/performed             Past Medical History:  Diagnosis Date   Anxiety    Depression    GERD (gastroesophageal reflux disease)    Hypertension     Past Surgical History:  Procedure Laterality Date   APPENDECTOMY     PARATHYROIDECTOMY      There were no vitals filed for this visit.   Subjective Assessment - 01/18/21 1106     Subjective Patient reports she was sick earlier this week and was unable to perform entire HEP this week. No pain reported.    Patient is accompained by: Family member    Pertinent History 80 y.o. female presents to skilled physical therapy with a history of COVID-19 infection 1 year ago, hypothyroidism, and fall on 07/15/2020 with resultant nondisplaced right temporal bone fracture with associated minimal left parietal subarachnoid hemorrhage and a resolved right frontotemporal subdural hematoma who presented to [facility] hospital on 07/23/20 with intermitten but worsening word finding difficulties and expressive aphasia. While in the hospital, she had approximately 24 hours of speaking "in gibberish" and unable to speak. After about 1 day, it was much improved and she was  transferred to [facility]. She normally lives in an independent living apartment. During the week, daughter stated that she was overall doing better though she still would mix up words. On the day of admit, the daughter spoke to her at around noon and she said they had a "normal" conversation. She arrived at [facility] to visit with her at 2:15 and immediately felt that her mother was not well. She was back to speaking non-sensically. Daughter requested return to [facility]. Upon arrival she was able to follow commands and answer some questions accurately, however, she was having considerable word finding difficulties and seemed somewhat frustrated. 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    Limitations Standing;Walking    How long can you sit comfortably? unlimited time    How long can you stand comfortably? less than 5 mins    How long can you walk comfortably? less than 5 mins    Patient Stated Goals "I want to be able to get back to church and rejoin the knitting club"             INTERVENTIONS   THEREX: Seated hip march for 1 min- 15 reps each LE with 3lb. Seated knee ext for 1 min- 14 reps each LE with 3lb. Seated hip flex/abd up and over hedgehog BLE for 15 reps with 3# AW  Seated hamstring curl with GTB 2 x 15  reps   Sit to stand x 10 reps, without UE support -Increased time to perform, difficulty with final 3 reps.    Step up onto 1st step with BUE support x 4 reps followed x8 reps; SOB experienced and PT recommended rest break after each set.  Standing hip abduction and extension at support bar - 6 reps each side with SOB and pt fatigue. PT ended exercise for safety.  Precor leg press 40# 2 x 10;   Education provided throughout session via VC/TC and demonstration to facilitate movement at target joints and correct muscle activation for all testing and exercises performed.     Clinical Impression:  Pt arrived with  excellent motivation today. POC was continued with limited progressions as pt reports she has not been feeling well and has not been active for the past 4 days. She was able to complete 1 min of seated therex activities without stopping today. SOB was a major limiting factor during standing exercises including step ups and standing LE strengthening at the support bar. Her Rollator was kept close during these exercises due to the quick onset of SOB and need for a seated rest break. She was fatigued at end of session but able to walk back up to the entrance. She will benefit from further skilled therapy to improve LE strength and to improve endurance/functional capacity to increase quality of life.          PT Short Term Goals - 11/10/20 0609       PT SHORT TERM GOAL #1   Title Patient will be independent in home exercise program to improve strength/mobility for better functional independence with ADLs.    Baseline 09/26/2020- Patient requires cues for Breathing techniques with walking and reminders to use the BORG RPE scale. She is knowledgeable of seated LE strengthening but will continue to benefit from progression of HEP to more progressive exercises. 11/10/2020- Patient able to verbalize and demo good working knowledge of breathing technqiues, BORG Scale, and seated/standing therex along with walking program with no questions or concerns at this time.    Time 6    Period Weeks    Status Achieved    Target Date 09/27/20               PT Long Term Goals - 11/10/20 1157       PT LONG TERM GOAL #1   Title Patient will increase FOTO score to equal to or greater than to 61 demonstrate statistically significant improvement in mobility and quality of life.    Baseline 02/08: 45; 11/10/2020= 49    Time 12    Period Weeks    Status On-going    Target Date 02/02/21      PT LONG TERM GOAL #2   Title Patient (> 82 years old) will complete five times sit to stand test in < 15 seconds  indicating an increased LE strength and improved balance.    Baseline 02/08: 19s without BUE support. 09/26/2020= 16.32 sec without UE support. 11/10/2020= 17.03 sec without UE support.    Time 12    Period Weeks    Status On-going    Target Date 02/02/21      PT LONG TERM GOAL #3   Title Patient will increase 10 meter walk test to >1.4541m/s as to improve gait speed for better community ambulation and to reduce fall risk.    Baseline 02/08:0.37 m/s. 09/26/2020= 0.59 m/s using 4WW. 11/10/2020= 0.75 m/s using 1OX4WW  Time 12    Period Weeks    Status On-going    Target Date 02/02/21      PT LONG TERM GOAL #4   Title Patient will reduce timed up and go to <11 seconds to reduce fall risk and demonstrate improved transfer/gait ability.    Baseline 02/08: 33.43 with 2WW. 09/26/2020= 22.18 sec with 4WW; 11/10/2020=19.33 with 4WW    Time 12    Period Weeks    Status On-going    Target Date 02/02/21      PT LONG TERM GOAL #5   Title Patient will increase ABC scale score >80% to demonstrate better functional mobility and better confidence with ADLs.    Baseline 02/08: 48.25%; 11/10/2020= 53.13    Time 12    Period Weeks    Status On-going    Target Date 02/02/21      PT LONG TERM GOAL #6   Title Patient will demonstrate improved 6 min walk test from only able to complete 100 feet in  prior to stoppage from fatigue to completing test at 550 feet or more without rest break, reporting some difficulty or less to improve walking tolerance with community ambulation including walking to dinining hall at independent living facility, grocery shopping, going to church,etc.    Baseline 09/26/2020= 100 feet in 1 min using 4WW- stopped due to fatigue. 11/10/2020= 555 feet with 4WW in 4 min 28 sec.    Time 12    Period Weeks    Status On-going    Target Date 02/02/21                   Plan - 01/18/21 1557     Clinical Impression Statement Pt arrived with excellent motivation today. POC was continued  with limited progressions as pt reports she has not been feeling well and has not been active for the past 4 days. She was able to complete 1 min of seated therex activities without stopping today. SOB was a major limiting factor during standing exercises including step ups and standing LE strengthening at the support bar. Her Rollator was kept close during these exercises due to the quick onset of SOB and need for a seated rest break. She was fatigued at end of session but able to walk back up to the entrance. She will benefit from further skilled therapy to improve LE strength and to improve endurance/functional capacity to increase quality of life.    Personal Factors and Comorbidities Age;Comorbidity 3+    Comorbidities TIA, aphasia, acute bronchitis    Examination-Activity Limitations Stairs;Squat;Bend;Stand;Transfers;Lift    Examination-Participation Restrictions Church    Stability/Clinical Decision Making Evolving/Moderate complexity    Rehab Potential Fair    PT Frequency 2x / week    PT Duration 12 weeks    PT Treatment/Interventions ADLs/Self Care Home Management;Canalith Repostioning;Moist Heat;Traction;Electrical Stimulation;Gait training;Stair training;Functional mobility training;Therapeutic activities;Therapeutic exercise;Balance training;Neuromuscular re-education;Patient/family education;Passive range of motion;Energy conservation    PT Next Visit Plan progress strengthening and balance exercises, progress cardiovascular endurance training; progress standing therex, progress volume of distance amb.    Consulted and Agree with Plan of Care Patient             Patient will benefit from skilled therapeutic intervention in order to improve the following deficits and impairments:  Abnormal gait, Decreased endurance, Cardiopulmonary status limiting activity, Decreased activity tolerance, Decreased strength, Decreased balance, Decreased mobility, Difficulty walking, Improper body  mechanics, Decreased safety awareness  Visit Diagnosis: Abnormality of gait and  mobility  Difficulty in walking, not elsewhere classified  Muscle weakness (generalized)  Other lack of coordination  Unsteadiness on feet  Repeated falls  TIA (transient ischemic attack)     Problem List Patient Active Problem List   Diagnosis Date Noted   TIA (transient ischemic attack) 02/03/2020   Aphasia    Acute bronchitis 10/06/2016    Basilia Jumbo PT, DPT  Lavenia Atlas 01/18/2021, 4:05 PM  [place] [facility] MAIN [facility] SERVICES [address] [place], [place], [address] Phone: [phone]   Fax:  [phone]  Name: Kara Pennington MRN: [id number] Date of Birth: [birth year]

## 2021-01-20 ENCOUNTER — Ambulatory Visit: Payer: Medicare Other

## 2021-01-23 ENCOUNTER — Ambulatory Visit: Payer: Medicare Other

## 2021-01-25 ENCOUNTER — Ambulatory Visit: Payer: Medicare Other

## 2021-01-25 ENCOUNTER — Other Ambulatory Visit: Payer: Self-pay

## 2021-01-25 DIAGNOSIS — M6281 Muscle weakness (generalized): Secondary | ICD-10-CM

## 2021-01-25 DIAGNOSIS — R269 Unspecified abnormalities of gait and mobility: Secondary | ICD-10-CM | POA: Diagnosis not present

## 2021-01-25 DIAGNOSIS — R262 Difficulty in walking, not elsewhere classified: Secondary | ICD-10-CM

## 2021-01-25 NOTE — Therapy (Signed)
[place] MAIN [facility] SERVICES [address] [place], [place], [address] Phone: [phone]   Fax:  [phone]  Physical Therapy Treatment/Physical Therapy Progress Note   Dates of reporting period  11/10/2020   to 01/25/2021  Patient Details  Name: Kara Pennington MRN: [id number] Date of Birth: [birth year] Referring Provider (PT): Dr. Doran Clay   Encounter Date: 01/25/2021   PT End of Session - 01/25/21 1515     Visit Number 30    Number of Visits 50    Date for PT Re-Evaluation 02/02/21    Authorization Type eval: 54/65; PN and Recert on 12/14/1273- 1/70/0174; PN on 01/25/2021    PT Start Time 1015    PT Stop Time 1057    PT Time Calculation (min) 42 min    Equipment Utilized During Treatment Gait belt    Activity Tolerance Patient tolerated treatment well;Patient limited by fatigue    Behavior During Therapy WFL for tasks assessed/performed             Past Medical History:  Diagnosis Date   Anxiety    Depression    GERD (gastroesophageal reflux disease)    Hypertension     Past Surgical History:  Procedure Laterality Date   APPENDECTOMY     PARATHYROIDECTOMY      There were no vitals filed for this visit.   Subjective Assessment - 01/25/21 1033     Subjective Patient reports that she has not been as active due to [place] in her independent living facility and that she has been confined to her room and not walking to dining room.    Patient is accompained by: Family member    Pertinent History 80 y.o. female presents to skilled physical therapy with a history of COVID-19 infection 1 year ago, hypothyroidism, and fall on 07/15/2020 with resultant nondisplaced right temporal bone fracture with associated minimal left parietal subarachnoid hemorrhage and a resolved right frontotemporal subdural hematoma who presented to [facility] on 07/23/20 with intermitten but worsening word finding difficulties and expressive  aphasia. While in the hospital, she had approximately 24 hours of speaking "in gibberish" and unable to speak. After about 1 day, it was much improved and she was transferred to [facility]. She normally lives in an independent living apartment. During the week, daughter stated that she was overall doing better though she still would mix up words. On the day of admit, the daughter spoke to her at around noon and she said they had a "normal" conversation. She arrived at [facility] to visit with her at 2:15 and immediately felt that her mother was not well. She was back to speaking non-sensically. Daughter requested return to [facility]. Upon arrival she was able to follow commands and answer some questions accurately, however, she was having considerable word finding difficulties and seemed somewhat frustrated. 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    Limitations Standing;Walking    How long can you sit comfortably? unlimited time    How long can you stand comfortably? less than 5 mins    How long can you walk comfortably? less than 5 mins    Patient Stated Goals "I want to be able to get back to church and rejoin the knitting club"    Currently in Pain? No/denies             Assessed long term goals today: See goal section for specifics.   5xSTS=  13.87 sec (improved from 17.03)  10MWT= 0.68 m/s using 4WW (decreased from 0.75 m/s) TUG= 16.16 sec (improved from 19.33 sec)  6 min Walk=  250 feet in 2 min 30 sec using 4WW and patient limited due to fatigue today.    Clinical Impression: Patient presents with inconsistent results with goal assessment. Patient presents with improving functional strength as seen by 5 x STS score and reports no falls along with demo improved TUG score. She did decline in 6 min walk test today and this may be directly related to decline in amount of walking due to limitation of covid restrictions at  home/facility. Patient's condition has the potential to improve in response to therapy. Maximum improvement is yet to be obtained. The anticipated improvement is attainable and reasonable in a generally predictable time.                             PT Education - 01/25/21 1059     Education Details Functional outcome measures    Person(s) Educated Patient    Methods Explanation;Demonstration;Tactile cues;Verbal cues    Comprehension Verbalized understanding;Returned demonstration;Verbal cues required;Tactile cues required;Need further instruction              PT Short Term Goals - 11/10/20 2229       PT SHORT TERM GOAL #1   Title Patient will be independent in home exercise program to improve strength/mobility for better functional independence with ADLs.    Baseline 09/26/2020- Patient requires cues for Breathing techniques with walking and reminders to use the BORG RPE scale. She is knowledgeable of seated LE strengthening but will continue to benefit from progression of HEP to more progressive exercises. 11/10/2020- Patient able to verbalize and demo good working knowledge of breathing technqiues, BORG Scale, and seated/standing therex along with walking program with no questions or concerns at this time.    Time 6    Period Weeks    Status Achieved    Target Date 09/27/20               PT Long Term Goals - 01/25/21 1518       PT LONG TERM GOAL #1   Title Patient will increase FOTO score to equal to or greater than to 61 demonstrate statistically significant improvement in mobility and quality of life.    Baseline 02/08: 45; 11/10/2020= 49    Time 12    Period Weeks    Status On-going    Target Date 02/02/21      PT LONG TERM GOAL #2   Title Patient (> 8 years old) will complete five times sit to stand test in < 15 seconds indicating an increased LE strength and improved balance.    Baseline 02/08: 19s without BUE support. 09/26/2020= 16.32 sec  without UE support. 11/10/2020= 17.03 sec without UE support.  01/25/2021- 13.87 sec without UE Support- will keep goal active to ensure patient remains consistent.    Time 12    Period Weeks    Status Partially Met    Target Date 02/02/21      PT LONG TERM GOAL #3   Title Patient will increase 10 meter walk test to >1.32ms as to improve gait speed for better community ambulation and to reduce fall risk.    Baseline 02/08:0.37 m/s. 09/26/2020= 0.59 m/s using 4WW. 11/10/2020= 0.75 m/s using 4WW. 01/25/2021= 0.68 m/s using 4WW    Time 12  Period Weeks    Status On-going    Target Date 02/02/21      PT LONG TERM GOAL #4   Title Patient will reduce timed up and go to <11 seconds to reduce fall risk and demonstrate improved transfer/gait ability.    Baseline 02/08: 33.43 with 2WW. 09/26/2020= 22.18 sec with 4WW; 11/10/2020=19.33 with 4WW. 01/25/2021= 16.16 sec using 4WW.    Time 12    Period Weeks    Status On-going    Target Date 02/02/21      PT LONG TERM GOAL #5   Title Patient will increase ABC scale score >80% to demonstrate better functional mobility and better confidence with ADLs.    Baseline 02/08: 48.25%; 11/10/2020= 53.13    Time 12    Period Weeks    Status On-going    Target Date 02/02/21      PT LONG TERM GOAL #6   Title Patient will demonstrate improved 6 min walk test from only able to complete 100 feet in  35mn prior to stoppage from fatigue to completing test at 550 feet or more without rest break, reporting some difficulty or less to improve walking tolerance with community ambulation including walking to dinining hall at independent living facility, grocery shopping, going to church,etc.    Baseline 09/26/2020= 100 feet in 1 min using 4WW- stopped due to fatigue. 11/10/2020= 555 feet with 4WW in 4 min 28 sec.  01/25/2021= 250 feet in 2 min 30 sec using 4WW- stopped due to fatigue today.    Time 12    Period Weeks    Status On-going    Target Date 02/02/21                    Plan - 01/25/21 1516     Clinical Impression Statement Patient presents with inconsistent results with goal assessment. Patient presents with improving functional strength as seen by 5 x STS score and reports no falls along with demo improved TUG score. She did decline in 6 min walk test today and this may be directly related to decline in amount of walking due to limitation of covid restrictions at home/facility. Patient's condition has the potential to improve in response to therapy. Maximum improvement is yet to be obtained. The anticipated improvement is attainable and reasonable in a generally predictable time.    Personal Factors and Comorbidities Age;Comorbidity 3+    Comorbidities TIA, aphasia, acute bronchitis    Examination-Activity Limitations Stairs;Squat;Bend;Stand;Transfers;Lift    Examination-Participation Restrictions Church    Stability/Clinical Decision Making Evolving/Moderate complexity    Rehab Potential Fair    PT Frequency 2x / week    PT Duration 12 weeks    PT Treatment/Interventions ADLs/Self Care Home Management;Canalith Repostioning;Moist Heat;Traction;Electrical Stimulation;Gait training;Stair training;Functional mobility training;Therapeutic activities;Therapeutic exercise;Balance training;Neuromuscular re-education;Patient/family education;Passive range of motion;Energy conservation    PT Next Visit Plan progress strengthening and balance exercises, progress cardiovascular endurance training; progress standing therex, progress volume of distance amb.    PT Home Exercise Plan No changes- Encouraged patient to continue to walk in room as much as possible while dealing with COVID restrictions in her facility.    Consulted and Agree with Plan of Care Patient             Patient will benefit from skilled therapeutic intervention in order to improve the following deficits and impairments:  Abnormal gait, Decreased endurance, Cardiopulmonary status  limiting activity, Decreased activity tolerance, Decreased strength, Decreased balance, Decreased mobility, Difficulty walking, Improper body  mechanics, Decreased safety awareness  Visit Diagnosis: Abnormality of gait and mobility  Difficulty in walking, not elsewhere classified  Muscle weakness (generalized)     Problem List Patient Active Problem List   Diagnosis Date Noted   TIA (transient ischemic attack) 02/03/2020   Aphasia    Acute bronchitis 10/06/2016    Lewis Moccasin, PT 01/25/2021, 3:57 PM  [place] MAIN [facility] SERVICES [address] [place], [place], [address] Phone: [phone]   Fax:  [phone]  Name: SHAKELIA SCRIVNER MRN: [id number] Date of Birth: Nov 23, 1940

## 2021-01-27 ENCOUNTER — Ambulatory Visit: Payer: Medicare Other

## 2021-01-30 ENCOUNTER — Ambulatory Visit: Payer: Medicare Other

## 2021-02-01 ENCOUNTER — Other Ambulatory Visit: Payer: Self-pay

## 2021-02-01 ENCOUNTER — Ambulatory Visit: Payer: Medicare Other

## 2021-02-01 DIAGNOSIS — R269 Unspecified abnormalities of gait and mobility: Secondary | ICD-10-CM

## 2021-02-01 DIAGNOSIS — R262 Difficulty in walking, not elsewhere classified: Secondary | ICD-10-CM

## 2021-02-01 DIAGNOSIS — R278 Other lack of coordination: Secondary | ICD-10-CM

## 2021-02-01 DIAGNOSIS — M6281 Muscle weakness (generalized): Secondary | ICD-10-CM

## 2021-02-01 NOTE — Therapy (Signed)
[place] MAIN [facility] SERVICES [address] [place], [place], [address] Phone: [phone]   Fax:  [phone]  Physical Therapy Treatment  Patient Details  Name: Kara Pennington MRN: [id number] Date of Birth: 25-Oct-1940 Referring Provider (PT): Dr. Doran Clay   Encounter Date: 02/01/2021   PT End of Session - 02/01/21 1029     Visit Number 31    Number of Visits 50    Date for PT Re-Evaluation 02/02/21    Authorization Type eval: 01/09; PN and Recert on 09/08/3555- 09/27/252; PN on 01/25/2021    PT Start Time 1022    PT Stop Time 1059    PT Time Calculation (min) 37 min    Equipment Utilized During Treatment Gait belt    Activity Tolerance Patient tolerated treatment well;Patient limited by fatigue    Behavior During Therapy WFL for tasks assessed/performed             Past Medical History:  Diagnosis Date   Anxiety    Depression    GERD (gastroesophageal reflux disease)    Hypertension     Past Surgical History:  Procedure Laterality Date   APPENDECTOMY     PARATHYROIDECTOMY      There were no vitals filed for this visit.   Subjective Assessment - 02/01/21 1027     Subjective Patient reports she has been very sedentary due to [place] restrictions at her facility and states this is the first time she has been out since last Wed.    Patient is accompained by: Family member    Pertinent History 80 y.o. female presents to skilled physical therapy with a history of COVID-19 infection 1 year ago, hypothyroidism, and fall on 07/15/2020 with resultant nondisplaced right temporal bone fracture with associated minimal left parietal subarachnoid hemorrhage and a resolved right frontotemporal subdural hematoma who presented to [facility] on 07/23/20 with intermitten but worsening word finding difficulties and expressive aphasia. While in the hospital, she had approximately 24 hours of speaking "in gibberish" and unable to speak.  After about 1 day, it was much improved and she was transferred to [facility]. She normally lives in an independent living apartment. During the week, daughter stated that she was overall doing better though she still would mix up words. On the day of admit, the daughter spoke to her at around noon and she said they had a "normal" conversation. She arrived at [facility] to visit with her at 2:15 and immediately felt that her mother was not well. She was back to speaking non-sensically. Daughter requested return to [facility] Upon arrival she was able to follow commands and answer some questions accurately, however, she was having considerable word finding difficulties and seemed somewhat frustrated. 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    Limitations Standing;Walking    How long can you sit comfortably? unlimited time    How long can you stand comfortably? less than 5 mins    How long can you walk comfortably? less than 5 mins    Patient Stated Goals "I want to be able to get back to church and rejoin the knitting club"    Currently in Pain? No/denies               Interventions:  Therapeutic exercises:   Nustep LE only at seat 7 (0.12 mi total) for 6 min with intermittent reminders to keep SPM > 50 ( patient was slower today  avg around 35 SPM)  After 6 min- she rested 1 min then continued by adding BUE support x 3 more min- able to improve SPM to around 42 SPM.  (Total distance on nustep =0.2 mi)  Seated LE strengthening:  all with 2.5 ankle weight BLE LAQ Seated hip march Seated ham curls Seated calf press  Seated hip swing up and over orange hurdle  *12 reps each Leg per exercise (brief rest break between exercise)   Scap retraction with GTB 2 sets of 12 reps  Sit to stand x 10 reps without UE support. Patient rated as hard yet able to complete today.   Education provided throughout session via VC/TC and  demonstration to facilitate movement at target joints and correct muscle activation for all testing and exercises performed.   Clinical Impression: Patient presents with increased overall fatigue prior and during treatment yet able to complete mostly seated therex today. Patient has been more sedentary at home due to [place] restrictions at the facility - being confined to rooms and unable to walk daily to dining room. Encouraged her to be diligent with current home program and reviewed several seated LE strengthening. Patient reports she will try to be more compliant with exercises. She exhibited no difficulty following all cues today and performed well other than fatigue. She will benefit from further skilled therapy to improve LE strength and to improve endurance/functional capacity to increase quality of life.                        PT Education - 02/01/21 1028     Education Details exercise technique    Person(s) Educated Patient    Methods Explanation;Demonstration;Tactile cues;Verbal cues    Comprehension Verbalized understanding;Returned demonstration;Verbal cues required;Tactile cues required;Need further instruction              PT Short Term Goals - 11/10/20 1308       PT SHORT TERM GOAL #1   Title Patient will be independent in home exercise program to improve strength/mobility for better functional independence with ADLs.    Baseline 09/26/2020- Patient requires cues for Breathing techniques with walking and reminders to use the BORG RPE scale. She is knowledgeable of seated LE strengthening but will continue to benefit from progression of HEP to more progressive exercises. 11/10/2020- Patient able to verbalize and demo good working knowledge of breathing technqiues, BORG Scale, and seated/standing therex along with walking program with no questions or concerns at this time.    Time 6    Period Weeks    Status Achieved    Target Date 09/27/20                PT Long Term Goals - 01/25/21 1518       PT LONG TERM GOAL #1   Title Patient will increase FOTO score to equal to or greater than to 61 demonstrate statistically significant improvement in mobility and quality of life.    Baseline 02/08: 45; 11/10/2020= 49    Time 12    Period Weeks    Status On-going    Target Date 02/02/21      PT LONG TERM GOAL #2   Title Patient (> 32 years old) will complete five times sit to stand test in < 15 seconds indicating an increased LE strength and improved balance.    Baseline 02/08: 19s without BUE support. 09/26/2020= 16.32 sec without UE support. 11/10/2020= 17.03 sec without UE support.  01/25/2021-  13.87 sec without UE Support- will keep goal active to ensure patient remains consistent.    Time 12    Period Weeks    Status Partially Met    Target Date 02/02/21      PT LONG TERM GOAL #3   Title Patient will increase 10 meter walk test to >1.34ms as to improve gait speed for better community ambulation and to reduce fall risk.    Baseline 02/08:0.37 m/s. 09/26/2020= 0.59 m/s using 4WW. 11/10/2020= 0.75 m/s using 4WW. 01/25/2021= 0.68 m/s using 4WW    Time 12    Period Weeks    Status On-going    Target Date 02/02/21      PT LONG TERM GOAL #4   Title Patient will reduce timed up and go to <11 seconds to reduce fall risk and demonstrate improved transfer/gait ability.    Baseline 02/08: 33.43 with 2WW. 09/26/2020= 22.18 sec with 4WW; 11/10/2020=19.33 with 4WW. 01/25/2021= 16.16 sec using 4WW.    Time 12    Period Weeks    Status On-going    Target Date 02/02/21      PT LONG TERM GOAL #5   Title Patient will increase ABC scale score >80% to demonstrate better functional mobility and better confidence with ADLs.    Baseline 02/08: 48.25%; 11/10/2020= 53.13    Time 12    Period Weeks    Status On-going    Target Date 02/02/21      PT LONG TERM GOAL #6   Title Patient will demonstrate improved 6 min walk test from only able to complete 100 feet in   184m prior to stoppage from fatigue to completing test at 550 feet or more without rest break, reporting some difficulty or less to improve walking tolerance with community ambulation including walking to dinining hall at independent living facility, grocery shopping, going to church,etc.    Baseline 09/26/2020= 100 feet in 1 min using 4WW- stopped due to fatigue. 11/10/2020= 555 feet with 4WW in 4 min 28 sec.  01/25/2021= 250 feet in 2 min 30 sec using 4WW- stopped due to fatigue today.    Time 12    Period Weeks    Status On-going    Target Date 02/02/21                   Plan - 02/01/21 1029     Clinical Impression Statement Patient presents with increased overall fatigue prior and during treatment yet able to complete mostly seated therex today. Patient has been more sedentary at home due to [place] the facility - being confined to rooms and unable to walk daily to dining room. Encouraged her to be diligent with current home program and reviewed several seated LE strengthening. Patient reports she will try to be more compliant with exercises. She exhibited no difficulty following all cues today and performed well other than fatigue. She will benefit from further skilled therapy to improve LE strength and to improve endurance/functional capacity to increase quality of life.    Personal Factors and Comorbidities Age;Comorbidity 3+    Comorbidities TIA, aphasia, acute bronchitis    Examination-Activity Limitations Stairs;Squat;Bend;Stand;Transfers;Lift    Examination-Participation Restrictions Church    Stability/Clinical Decision Making Evolving/Moderate complexity    Rehab Potential Fair    PT Frequency 2x / week    PT Duration 12 weeks    PT Treatment/Interventions ADLs/Self Care Home Management;Canalith Repostioning;Moist Heat;Traction;Electrical Stimulation;Gait training;Stair training;Functional mobility training;Therapeutic activities;Therapeutic exercise;Balance  training;Neuromuscular re-education;Patient/family education;Passive range  of motion;Energy conservation    PT Next Visit Plan progress strengthening and balance exercises, progress cardiovascular endurance training; progress standing therex, progress volume of distance amb.    PT Home Exercise Plan No changes- Encouraged patient to continue to walk in room as much as possible while dealing with COVID restrictions in her facility.    Consulted and Agree with Plan of Care Patient             Patient will benefit from skilled therapeutic intervention in order to improve the following deficits and impairments:  Abnormal gait, Decreased endurance, Cardiopulmonary status limiting activity, Decreased activity tolerance, Decreased strength, Decreased balance, Decreased mobility, Difficulty walking, Improper body mechanics, Decreased safety awareness  Visit Diagnosis: Abnormality of gait and mobility  Difficulty in walking, not elsewhere classified  Muscle weakness (generalized)  Other lack of coordination     Problem List Patient Active Problem List   Diagnosis Date Noted   TIA (transient ischemic attack) 02/03/2020   Aphasia    Acute bronchitis 10/06/2016    Lewis Moccasin, PT 02/02/2021, 8:05 AM  [place] [address] [place], [place], [address] Phone: [phone]   Fax:  [phone]  Name: Kara Pennington MRN: [id number] Date of Birth: 1941-02-28

## 2021-02-03 ENCOUNTER — Ambulatory Visit: Payer: Medicare Other

## 2021-02-06 ENCOUNTER — Ambulatory Visit: Payer: Medicare Other

## 2021-02-08 ENCOUNTER — Ambulatory Visit: Payer: Medicare Other

## 2021-02-10 ENCOUNTER — Ambulatory Visit: Payer: Medicare Other

## 2021-02-13 ENCOUNTER — Ambulatory Visit: Payer: Medicare Other

## 2021-02-15 ENCOUNTER — Ambulatory Visit: Payer: Medicare Other | Attending: Physical Medicine and Rehabilitation

## 2021-02-15 ENCOUNTER — Other Ambulatory Visit: Payer: Self-pay

## 2021-02-15 DIAGNOSIS — R278 Other lack of coordination: Secondary | ICD-10-CM | POA: Diagnosis present

## 2021-02-15 DIAGNOSIS — M6281 Muscle weakness (generalized): Secondary | ICD-10-CM | POA: Diagnosis present

## 2021-02-15 DIAGNOSIS — R262 Difficulty in walking, not elsewhere classified: Secondary | ICD-10-CM

## 2021-02-15 DIAGNOSIS — R269 Unspecified abnormalities of gait and mobility: Secondary | ICD-10-CM | POA: Insufficient documentation

## 2021-02-15 NOTE — Therapy (Signed)
[place] [facility] [address] [place], [place], [address] Phone: [phone]   Fax:  [phone]  Physical Therapy Treatment/Recertification for 02/15/2021-05/10/2021  Patient Details  Name: Kara Pennington MRN: [id number] Date of Birth: Jul 23, 1940 Referring Provider (PT): Dr. Lanny CrampPatrick O'Brien   Encounter Date: 02/15/2021   PT End of Session - 02/15/21 0908     Visit Number 32    Number of Visits 56    Date for PT Re-Evaluation 05/10/21    Authorization Type eval: 02/08; PN and Recert on 11/10/2020- 02/02/2021; PN on 01/25/2021, Recert 02/15/2021-05/10/2021    PT Start Time 0900    PT Stop Time 0945    PT Time Calculation (min) 45 min    Equipment Utilized During Treatment Gait belt    Activity Tolerance Patient tolerated treatment well;Patient limited by fatigue    Behavior During Therapy WFL for tasks assessed/performed             Past Medical History:  Diagnosis Date   Anxiety    Depression    GERD (gastroesophageal reflux disease)    Hypertension     Past Surgical History:  Procedure Laterality Date   APPENDECTOMY     PARATHYROIDECTOMY      There were no vitals filed for this visit.   Subjective Assessment - 02/15/21 0907     Subjective I am feeling weak and I haven't been here as much due to transportation issues. I have also had a Lupus flare- up contributing to my weakness. My arms actually feel weaker than my legs.    Patient is accompained by: Family member    Pertinent History 80 y.o. female presents to skilled physical therapy with a history of COVID-19 infection 1 year ago, hypothyroidism, and fall on 07/15/2020 with resultant nondisplaced right temporal bone fracture with associated minimal left parietal subarachnoid hemorrhage and a resolved right frontotemporal subdural hematoma who [facility] with intermitten but worsening word finding difficulties and expressive aphasia.  While in the hospital, she had approximately 24 hours of speaking "in gibberish" and unable to speak. After about 1 day, it was much improved and she was transferred to [facility]. She normally lives in an independent living apartment. During the week, daughter stated that she was overall doing better though she still would mix up words. On the day of admit, the daughter spoke to her at around noon and she said they had a "normal" conversation. [facility] her at 2:15 and immediately felt that her mother was not well. She was back to speaking non-sensically. Daughter [facility]. Upon arrival she was able to follow commands and answer some questions accurately, however, she was having considerable word finding difficulties and seemed somewhat frustrated. 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    Limitations Standing;Walking    How long can you sit comfortably? unlimited time    How long can you stand comfortably? less than 5 mins    How long can you walk comfortably? less than 5 mins    Patient Stated Goals "I want to be able to get back to church and rejoin the knitting club"    Currently in Pain? No/denies             Interventions:   Assessed all LTG- See goals for specific detail.   FOTO=59% 5xSTS= 11.8 sec without UE 10MWT= 0.71 m/s  with 4WW ABC scale= 56% 6 min walk test- Patient ambulated 200 feet in 1 min 53 sec Patient later ambulated approx 180 in 2 min using 4WW exhibiting poor overall standing posture- leaning on forearms and reporting fatigue as limiting factor.   Assessed BUE Strength- grossly 4/5 throughout shoulder, elbow, forearm, and wrist.   Clinical Impression: Patient presents with some improvement overall during this certification including improving gait speed and self perceived condtion (Improved FOTO and ABC scale). She reports she has been more fatigued  due to lupus flare up and interested in performing more Upper body strengthening as she feels she is becoming weaker as well as continuing with functional endurance with activities. She was making good progress with 6 min walk test but las two scores have declined. Patient's condition has the potential to improve in response to therapy. Maximum improvement is yet to be obtained. The anticipated improvement is attainable and reasonable in a generally predictable time.  Patient is appropriate to continue PT and will focus on continued strengthening and functional endurance training. Patient is in agreement with plan.                           PT Education - 02/15/21 0953     Education Details Purspose of functional outcome measures    Person(s) Educated Patient    Methods Explanation;Demonstration;Tactile cues;Verbal cues    Comprehension Verbalized understanding;Returned demonstration;Verbal cues required;Tactile cues required;Need further instruction              PT Short Term Goals - 11/10/20 3716       PT SHORT TERM GOAL #1   Title Patient will be independent in home exercise program to improve strength/mobility for better functional independence with ADLs.    Baseline 09/26/2020- Patient requires cues for Breathing techniques with walking and reminders to use the BORG RPE scale. She is knowledgeable of seated LE strengthening but will continue to benefit from progression of HEP to more progressive exercises. 11/10/2020- Patient able to verbalize and demo good working knowledge of breathing technqiues, BORG Scale, and seated/standing therex along with walking program with no questions or concerns at this time.    Time 6    Period Weeks    Status Achieved    Target Date 09/27/20               PT Long Term Goals - 02/15/21 0915       PT LONG TERM GOAL #1   Title Patient will increase FOTO score to equal to or greater than to 61 demonstrate statistically  significant improvement in mobility and quality of life.    Baseline 02/08: 45; 11/10/2020= 49; 02/15/2021=59%    Time 12    Period Weeks    Status On-going    Target Date 05/10/21      PT LONG TERM GOAL #2   Title Patient (> 20 years old) will complete five times sit to stand test in < 15 seconds indicating an increased LE strength and improved balance.    Baseline 02/08: 19s without BUE support. 09/26/2020= 16.32 sec without UE support. 11/10/2020= 17.03 sec without UE support.  01/25/2021- 13.87 sec without UE Support- will keep goal active to ensure patient remains consistent. 02/15/2021= 11.8 sec without UE support    Time 12    Period Weeks    Status Achieved      PT LONG TERM GOAL #3   Title Patient will increase 10  meter walk test to >1.67m/s as to improve gait speed for better community ambulation and to reduce fall risk.    Baseline 02/08:0.37 m/s. 09/26/2020= 0.59 m/s using 4WW. 11/10/2020= 0.75 m/s using 4WW. 01/25/2021= 0.68 m/s using 4WW. 02/15/2021= 0.71 m/s using 4WW    Time 12    Period Weeks    Status On-going    Target Date 05/10/21      PT LONG TERM GOAL #4   Title Patient will reduce timed up and go to <11 seconds to reduce fall risk and demonstrate improved transfer/gait ability.    Baseline 02/08: 33.43 with 2WW. 09/26/2020= 22.18 sec with 0XN; 11/10/2020=19.33 with 4WW. 01/25/2021= 16.16 sec using 4WW. 02/15/2021- Will assess next visit    Time 12    Period Weeks    Status On-going    Target Date 05/10/21      PT LONG TERM GOAL #5   Title Patient will increase ABC scale score >80% to demonstrate better functional mobility and better confidence with ADLs.    Baseline 02/08: 48.25%; 11/10/2020= 53.13. 02/15/2021=56.25    Time 12    Period Weeks    Status On-going    Target Date 05/10/21      PT LONG TERM GOAL #6   Title Patient will demonstrate improved 6 min walk test from only able to complete 100 feet in  prior to stoppage from fatigue to completing test at 550 feet or  more without rest break, reporting some difficulty or less to improve walking tolerance with community ambulation including walking to dinining hall at independent living facility, grocery shopping, going to church,etc.    Baseline 09/26/2020= 100 feet in 1 min using 4WW- stopped due to fatigue. 11/10/2020= 555 feet with 4WW in 4 min 28 sec.  01/25/2021= 250 feet in 2 min 30 sec using 4WW- stopped due to fatigue today. 02/15/2021= 200 feet in 1 min 53 sec using 4WW- Patient reports fatigue as well as active Lupus Flare up.    Time 12    Period Weeks    Status On-going    Target Date 05/10/21                   Plan - 02/15/21 0957     Clinical Impression Statement Patient presents with some improvement overall during this certification including improving gait speed and self perceived condtion (Improved FOTO and ABC scale). She reports she has been more fatigued due to lupus flare up and interested in performing more Upper body strengthening as she feels she is becoming weaker as well as continuing with functional endurance with activities. She was making good progress with 6 min walk test but las two scores have declined. Patient's condition has the potential to improve in response to therapy. Maximum improvement is yet to be obtained. The anticipated improvement is attainable and reasonable in a generally predictable time.  Patient is appropriate to continue PT and will focus on continued strengthening and functional endurance training. Patient is in agreement with plan    Personal Factors and Comorbidities Age;Comorbidity 3+    Comorbidities TIA, aphasia, acute bronchitis    Examination-Activity Limitations Stairs;Squat;Bend;Stand;Transfers;Lift    Examination-Participation Restrictions Church    Stability/Clinical Decision Making Evolving/Moderate complexity    Rehab Potential Fair    PT Frequency 2x / week    PT Duration 12 weeks    PT Treatment/Interventions ADLs/Self Care Home  Management;Canalith Repostioning;Moist Heat;Traction;Electrical Stimulation;Gait training;Stair training;Functional mobility training;Therapeutic activities;Therapeutic exercise;Balance training;Neuromuscular re-education;Patient/family education;Passive range of  motion;Energy conservation    PT Next Visit Plan progress strengthening and balance exercises, progress cardiovascular endurance training; progress standing therex, progress volume of distance amb.    PT Home Exercise Plan No changes- Encouraged patient to continue to walk in room as much as possible while dealing with COVID restrictions in her facility.    Consulted and Agree with Plan of Care Patient             Patient will benefit from skilled therapeutic intervention in order to improve the following deficits and impairments:  Abnormal gait, Decreased endurance, Cardiopulmonary status limiting activity, Decreased activity tolerance, Decreased strength, Decreased balance, Decreased mobility, Difficulty walking, Improper body mechanics, Decreased safety awareness  Visit Diagnosis: Abnormality of gait and mobility  Difficulty in walking, not elsewhere classified  Muscle weakness (generalized)  Other lack of coordination     Problem List Patient Active Problem List   Diagnosis Date Noted   TIA (transient ischemic attack) 02/03/2020   Aphasia    Acute bronchitis 10/06/2016    Lenda Kelp, PT 02/15/2021, 12:01 PM  [place] [facility] MAIN [facility] SERVICES [address] [place], [place], [address] Phone: [phone]   Fax:  [phone]  Name: Kara Pennington MRN: [id number] Date of Birth: [birth year]

## 2021-02-22 ENCOUNTER — Ambulatory Visit: Payer: Medicare Other

## 2021-02-22 DIAGNOSIS — R2689 Other abnormalities of gait and mobility: Secondary | ICD-10-CM

## 2021-02-22 DIAGNOSIS — R278 Other lack of coordination: Secondary | ICD-10-CM

## 2021-02-22 DIAGNOSIS — M6281 Muscle weakness (generalized): Secondary | ICD-10-CM

## 2021-02-22 DIAGNOSIS — G459 Transient cerebral ischemic attack, unspecified: Secondary | ICD-10-CM

## 2021-02-22 DIAGNOSIS — R269 Unspecified abnormalities of gait and mobility: Secondary | ICD-10-CM

## 2021-02-22 DIAGNOSIS — R262 Difficulty in walking, not elsewhere classified: Secondary | ICD-10-CM

## 2021-02-22 DIAGNOSIS — R4701 Aphasia: Secondary | ICD-10-CM

## 2021-02-22 DIAGNOSIS — R2681 Unsteadiness on feet: Secondary | ICD-10-CM

## 2021-02-22 DIAGNOSIS — R296 Repeated falls: Secondary | ICD-10-CM

## 2021-03-08 ENCOUNTER — Ambulatory Visit: Payer: Medicare Other

## 2021-03-08 ENCOUNTER — Other Ambulatory Visit: Payer: Self-pay

## 2021-03-08 DIAGNOSIS — R278 Other lack of coordination: Secondary | ICD-10-CM

## 2021-03-08 DIAGNOSIS — R262 Difficulty in walking, not elsewhere classified: Secondary | ICD-10-CM

## 2021-03-08 DIAGNOSIS — R269 Unspecified abnormalities of gait and mobility: Secondary | ICD-10-CM

## 2021-03-08 DIAGNOSIS — M6281 Muscle weakness (generalized): Secondary | ICD-10-CM

## 2021-03-08 NOTE — Therapy (Signed)
[facility] MAIN [facility] SERVICES [address] [place], [place], [address] Phone: [phone]   Fax:  [phone]  Physical Therapy Treatment  Patient Details  Name: Kara Pennington MRN: [id number] Date of Birth: 1941/03/07 Referring Provider (PT): Dr. Lanny Cramp   Encounter Date: 03/08/2021   PT End of Session - 03/08/21 0908     Visit Number 33    Number of Visits 56    Date for PT Re-Evaluation 05/10/21    Authorization Type eval: 02/08; PN and Recert on 11/10/2020- 02/02/2021; PN on 01/25/2021, Recert 02/15/2021-05/10/2021    PT Start Time 0902    PT Stop Time 0943    PT Time Calculation (min) 41 min    Equipment Utilized During Treatment Gait belt    Activity Tolerance Patient tolerated treatment well;Patient limited by fatigue    Behavior During Therapy WFL for tasks assessed/performed             Past Medical History:  Diagnosis Date   Anxiety    Depression    GERD (gastroesophageal reflux disease)    Hypertension     Past Surgical History:  Procedure Laterality Date   APPENDECTOMY     PARATHYROIDECTOMY      There were no vitals filed for this visit.   Subjective Assessment - 03/08/21 0905     Subjective Patient reports not performing much as far as exercises. When asked why she is not working- She reports " just don't have the energy and I had a flare up with my Lupus and that makes me fatigue worse."    Patient is accompained by: Family member    Pertinent History 80 y.o. female presents to skilled physical therapy with a history of COVID-19 infection 1 year ago, hypothyroidism, and fall on 07/15/2020 with resultant nondisplaced right temporal bone fracture with associated minimal left parietal subarachnoid hemorrhage and a resolved right frontotemporal subdural hematoma who presented to [facility] hospital on 07/23/20 with intermitten but worsening word finding difficulties and expressive aphasia. While in the hospital, she  had approximately 24 hours of speaking "in gibberish" and unable to speak. After about 1 day, it was much improved and she was transferred to [facility]. She normally lives in an independent living apartment. During the week, daughter stated that she was overall doing better though she still would mix up words. On the day of admit, the daughter spoke to her at around noon and she said they had a "normal" conversation. She arrived at [facility] to visit with her at 2:15 and immediately felt that her mother was not well. She was back to speaking non-sensically. Daughter requested return to [facility]. Upon arrival she was able to follow commands and answer some questions accurately, however, she was having considerable word finding difficulties and seemed somewhat frustrated. 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    Limitations Standing;Walking    How long can you sit comfortably? unlimited time    How long can you stand comfortably? less than 5 mins    How long can you walk comfortably? less than 5 mins    Patient Stated Goals "I want to be able to get back to church and rejoin the knitting club"            Interventions:  Therapeutic Exercises:   Seated LE strengthening:  all with 2.5 ankle weight BLE LAQ - patient rates as easy.  Seated hip march x 12  reps- Patient rates as Medium Seated ham curls with GTB x 10 reps- Patient rates as medium Seated calf press x 12 reps- Patient rates as easy Seated hip swing up and over pink hedgehog- Patient rates as hard.  *12 reps each Leg per exercise (brief rest break between exercise)   Scap retraction with GTB 2 sets of 12 reps (patient rates as medium)   Standing Lumbar flex/ext x 12 reps in //bars. (Patient reports as tiring)   Sit to stand x 10 reps total (4 reps without UE support and 6 reps with  UE support)  Patient rated as hard yet able to complete today.   Static standing in  // bars- Standing erect without UE support x 40 sec on 1st trial and 73 sec on the second 2nd trial.   Education provided throughout session via VC/TC and demonstration to facilitate movement at target joints and correct muscle activation for all testing and exercises performed.    Clinical Impression: Patient presents with ongoing weakness requiring short rest breaks. She was able to complete therex with encouragement and able to progress her static standing without UE with practice today. Due to her reported decline. Discussed increasing frequency of PT back to orignially requested 2x/week and patient in agreement. She will benefit from further skilled therapy to improve LE strength and to improve endurance/functional capacity to increase quality of life.                         PT Education - 03/08/21 0908     Education Details exercise technique    Person(s) Educated Patient    Methods Explanation;Demonstration;Tactile cues;Verbal cues    Comprehension Verbalized understanding;Returned demonstration;Verbal cues required;Tactile cues required;Need further instruction              PT Short Term Goals - 11/10/20 4098       PT SHORT TERM GOAL #1   Title Patient will be independent in home exercise program to improve strength/mobility for better functional independence with ADLs.    Baseline 09/26/2020- Patient requires cues for Breathing techniques with walking and reminders to use the BORG RPE scale. She is knowledgeable of seated LE strengthening but will continue to benefit from progression of HEP to more progressive exercises. 11/10/2020- Patient able to verbalize and demo good working knowledge of breathing technqiues, BORG Scale, and seated/standing therex along with walking program with no questions or concerns at this time.    Time 6    Period Weeks    Status Achieved    Target Date 09/27/20               PT Long Term Goals - 02/15/21 0915       PT  LONG TERM GOAL #1   Title Patient will increase FOTO score to equal to or greater than to 61 demonstrate statistically significant improvement in mobility and quality of life.    Baseline 02/08: 45; 11/10/2020= 49; 02/15/2021=59%    Time 12    Period Weeks    Status On-going    Target Date 05/10/21      PT LONG TERM GOAL #2   Title Patient (> 14 years old) will complete five times sit to stand test in < 15 seconds indicating an increased LE strength and improved balance.    Baseline 02/08: 19s without BUE support. 09/26/2020= 16.32 sec without UE support. 11/10/2020= 17.03 sec without UE support.  01/25/2021- 13.87 sec without UE Support- will  keep goal active to ensure patient remains consistent. 02/15/2021= 11.8 sec without UE support    Time 12    Period Weeks    Status Achieved      PT LONG TERM GOAL #3   Title Patient will increase 10 meter walk test to >1.74m/s as to improve gait speed for better community ambulation and to reduce fall risk.    Baseline 02/08:0.37 m/s. 09/26/2020= 0.59 m/s using 4WW. 11/10/2020= 0.75 m/s using 4WW. 01/25/2021= 0.68 m/s using 4WW. 02/15/2021= 0.71 m/s using 4WW    Time 12    Period Weeks    Status On-going    Target Date 05/10/21      PT LONG TERM GOAL #4   Title Patient will reduce timed up and go to <11 seconds to reduce fall risk and demonstrate improved transfer/gait ability.    Baseline 02/08: 33.43 with 2WW. 09/26/2020= 22.18 sec with 9BM; 11/10/2020=19.33 with 4WW. 01/25/2021= 16.16 sec using 4WW. 02/15/2021- Will assess next visit    Time 12    Period Weeks    Status On-going    Target Date 05/10/21      PT LONG TERM GOAL #5   Title Patient will increase ABC scale score >80% to demonstrate better functional mobility and better confidence with ADLs.    Baseline 02/08: 48.25%; 11/10/2020= 53.13. 02/15/2021=56.25    Time 12    Period Weeks    Status On-going    Target Date 05/10/21      PT LONG TERM GOAL #6   Title Patient will demonstrate improved 6 min  walk test from only able to complete 100 feet in  prior to stoppage from fatigue to completing test at 550 feet or more without rest break, reporting some difficulty or less to improve walking tolerance with community ambulation including walking to dinining hall at independent living facility, grocery shopping, going to church,etc.    Baseline 09/26/2020= 100 feet in 1 min using 4WW- stopped due to fatigue. 11/10/2020= 555 feet with 4WW in 4 min 28 sec.  01/25/2021= 250 feet in 2 min 30 sec using 4WW- stopped due to fatigue today. 02/15/2021= 200 feet in 1 min 53 sec using 4WW- Patient reports fatigue as well as active Lupus Flare up.    Time 12    Period Weeks    Status On-going    Target Date 05/10/21                   Plan - 03/08/21 1324     Clinical Impression Statement Patient presents with ongoing weakness requiring short rest breaks. She was able to complete therex with encouragement and able to progress her static standing without UE with practice today. Due to her reported decline. Discussed increasing frequency of PT back to orignially requested 2x/week and patient in agreement. She will benefit from further skilled therapy to improve LE strength and to improve endurance/functional capacity to increase quality of life.    Personal Factors and Comorbidities Age;Comorbidity 3+    Comorbidities TIA, aphasia, acute bronchitis    Examination-Activity Limitations Stairs;Squat;Bend;Stand;Transfers;Lift    Examination-Participation Restrictions Church    Stability/Clinical Decision Making Evolving/Moderate complexity    Rehab Potential Fair    PT Frequency 2x / week    PT Duration 12 weeks    PT Treatment/Interventions ADLs/Self Care Home Management;Canalith Repostioning;Moist Heat;Traction;Electrical Stimulation;Gait training;Stair training;Functional mobility training;Therapeutic activities;Therapeutic exercise;Balance training;Neuromuscular re-education;Patient/family  education;Passive range of motion;Energy conservation    PT Next Visit Plan progress strengthening and  balance exercises, progress cardiovascular endurance training; progress standing therex, progress volume of distance amb.    PT Home Exercise Plan No changes    Consulted and Agree with Plan of Care Patient             Patient will benefit from skilled therapeutic intervention in order to improve the following deficits and impairments:  Abnormal gait, Decreased endurance, Cardiopulmonary status limiting activity, Decreased activity tolerance, Decreased strength, Decreased balance, Decreased mobility, Difficulty walking, Improper body mechanics, Decreased safety awareness  Visit Diagnosis: Abnormality of gait and mobility  Difficulty in walking, not elsewhere classified  Muscle weakness (generalized)  Other lack of coordination     Problem List Patient Active Problem List   Diagnosis Date Noted   TIA (transient ischemic attack) 02/03/2020   Aphasia    Acute bronchitis 10/06/2016    Lenda KelpJeffrey N Shelisha Gautier, PT 03/08/2021, 1:29 PM  [facility] MAIN [facility] [address] [place], [place], [id number] Phone: [phone]   Fax:  [phone]  Name: Kara Pennington MRN: [id number] Date of Birth: [birth year]

## 2021-03-09 ENCOUNTER — Ambulatory Visit: Payer: Medicare Other | Admitting: Physical Therapy

## 2021-03-21 ENCOUNTER — Ambulatory Visit: Payer: Medicare Other | Attending: Physical Medicine and Rehabilitation

## 2021-03-21 ENCOUNTER — Other Ambulatory Visit: Payer: Self-pay

## 2021-03-21 DIAGNOSIS — R278 Other lack of coordination: Secondary | ICD-10-CM | POA: Insufficient documentation

## 2021-03-21 DIAGNOSIS — R2681 Unsteadiness on feet: Secondary | ICD-10-CM | POA: Diagnosis present

## 2021-03-21 DIAGNOSIS — R296 Repeated falls: Secondary | ICD-10-CM | POA: Insufficient documentation

## 2021-03-21 DIAGNOSIS — R269 Unspecified abnormalities of gait and mobility: Secondary | ICD-10-CM | POA: Insufficient documentation

## 2021-03-21 DIAGNOSIS — R262 Difficulty in walking, not elsewhere classified: Secondary | ICD-10-CM | POA: Diagnosis present

## 2021-03-21 DIAGNOSIS — M6281 Muscle weakness (generalized): Secondary | ICD-10-CM

## 2021-03-21 DIAGNOSIS — R2689 Other abnormalities of gait and mobility: Secondary | ICD-10-CM | POA: Diagnosis present

## 2021-03-21 NOTE — Therapy (Signed)
[facility] MAIN [facility] SERVICES [address] [place], [place], [address] Phone: [phone]   Fax:  [phone]  Physical Therapy Treatment  Patient Details  Name: Kara Pennington MRN: [id number] Date of Birth: 01/25/1941 Referring Provider (PT): Dr. Lanny Cramp   Encounter Date: 03/21/2021   PT End of Session - 03/21/21 1356     Visit Number 34    Number of Visits 56    Date for PT Re-Evaluation 05/10/21    Authorization Type eval: 02/08; PN and Recert on 11/10/2020- 02/02/2021; PN on 01/25/2021, Recert 02/15/2021-05/10/2021    PT Start Time 1345    PT Stop Time 1428    PT Time Calculation (min) 43 min    Equipment Utilized During Treatment Gait belt    Activity Tolerance Patient tolerated treatment well;Patient limited by fatigue    Behavior During Therapy WFL for tasks assessed/performed             Past Medical History:  Diagnosis Date   Anxiety    Depression    GERD (gastroesophageal reflux disease)    Hypertension     Past Surgical History:  Procedure Laterality Date   APPENDECTOMY     PARATHYROIDECTOMY      There were no vitals filed for this visit.   Subjective Assessment - 03/21/21 1347     Subjective Patient reports she has bene having a lupus flare up and feels exhausted with no energy. Has not been doing her exercises.    Patient is accompained by: Family member    Pertinent History 80 y.o. female presents to skilled physical therapy with a history of COVID-19 infection 1 year ago, hypothyroidism, and fall on 07/15/2020 with resultant nondisplaced right temporal bone fracture with associated minimal left parietal subarachnoid hemorrhage and a resolved right frontotemporal subdural hematoma who presented to [facility] hospital on 07/23/20 with intermitten but worsening word finding difficulties and expressive aphasia. While in the hospital, she had approximately 24 hours of speaking "in gibberish" and unable to speak.  After about 1 day, it was much improved and she was transferred to [facility]. She normally lives in an independent living apartment. During the week, daughter stated that she was overall doing better though she still would mix up words. On the day of admit, the daughter spoke to her at around noon and she said they had a "normal" conversation. She arrived at [facility] to visit with her at 2:15 and immediately felt that her mother was not well. She was back to speaking non-sensically. Daughter requested return to [facility]. Upon arrival she was able to follow commands and answer some questions accurately, however, she was having considerable word finding difficulties and seemed somewhat frustrated. 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    Limitations Standing;Walking    How long can you sit comfortably? unlimited time    How long can you stand comfortably? less than 5 mins    How long can you walk comfortably? less than 5 mins    Patient Stated Goals "I want to be able to get back to church and rejoin the knitting club"    Currently in Pain? Yes   whole body fatigue and ache   Pain Score 2     Pain Location Generalized                  Interventions:  Therapeutic Exercises:    Seated LE strengthening:  all with  2.5 ankle weight  Seated LAQ  12x each LE 3 second holds  Seated hip march x 12 reps-cue for arc of motion.  Seated alternating IR/ER 12x each LE Seated heel raise 12x   Seated ham curls with GTB x 12 reps- Patient rates as medium Seated hip swing up and over pink hedgehog- Patient rates as hard. 12x each LE  Seated TrA swiss ball stabilization 10x 3 second holds  Seated TrA swiss ball activation with alternating UE raises 10x each UE   Ambulation: Ambulate 40 ft x 4 trials with seated rest breaks ; increased trunk flexion with fatigue; oxygen drop to 94%   Weave between 4 cones with rollator x 4 trials ;  ery challenging for patient, requires rest break between   Sit to stand 5x; 2 sets with 30 second rest between sets.     Education provided throughout session via VC/TC and demonstration to facilitate movement at target joints and correct muscle activation for all testing and exercises performed.    Patient educated on need for compliance with Hep for improvements in mobility. Patient short of breathe requiring monitoring of Sp02 throughout session. She has increased trunk flexion with prolonged standing reporting back pain in upright position. Shortness of breath remains primary limitation during session. She will benefit from further skilled therapy to improve LE strength and to improve endurance/functional capacity to increase quality of life.                    PT Education - 03/21/21 1356     Education Details exercise technique, body mechanics    Person(s) Educated Patient    Methods Explanation;Demonstration;Tactile cues;Verbal cues    Comprehension Verbalized understanding;Returned demonstration;Verbal cues required;Tactile cues required              PT Short Term Goals - 11/10/20 0609       PT SHORT TERM GOAL #1   Title Patient will be independent in home exercise program to improve strength/mobility for better functional independence with ADLs.    Baseline 09/26/2020- Patient requires cues for Breathing techniques with walking and reminders to use the BORG RPE scale. She is knowledgeable of seated LE strengthening but will continue to benefit from progression of HEP to more progressive exercises. 11/10/2020- Patient able to verbalize and demo good working knowledge of breathing technqiues, BORG Scale, and seated/standing therex along with walking program with no questions or concerns at this time.    Time 6    Period Weeks    Status Achieved    Target Date 09/27/20               PT Long Term Goals - 02/15/21 0915       PT LONG TERM GOAL #1   Title  Patient will increase FOTO score to equal to or greater than to 61 demonstrate statistically significant improvement in mobility and quality of life.    Baseline 02/08: 45; 11/10/2020= 49; 02/15/2021=59%    Time 12    Period Weeks    Status On-going    Target Date 05/10/21      PT LONG TERM GOAL #2   Title Patient (> 75 years old) will complete five times sit to stand test in < 15 seconds indicating an increased LE strength and improved balance.    Baseline 02/08: 19s without BUE support. 09/26/2020= 16.32 sec without UE support. 11/10/2020= 17.03 sec without UE support.  01/25/2021- 13.87 sec without UE Support- will keep goal  active to ensure patient remains consistent. 02/15/2021= 11.8 sec without UE support    Time 12    Period Weeks    Status Achieved      PT LONG TERM GOAL #3   Title Patient will increase 10 meter walk test to >1.51m/s as to improve gait speed for better community ambulation and to reduce fall risk.    Baseline 02/08:0.37 m/s. 09/26/2020= 0.59 m/s using 4WW. 11/10/2020= 0.75 m/s using 4WW. 01/25/2021= 0.68 m/s using 4WW. 02/15/2021= 0.71 m/s using 4WW    Time 12    Period Weeks    Status On-going    Target Date 05/10/21      PT LONG TERM GOAL #4   Title Patient will reduce timed up and go to <11 seconds to reduce fall risk and demonstrate improved transfer/gait ability.    Baseline 02/08: 33.43 with 2WW. 09/26/2020= 22.18 sec with 7QB; 11/10/2020=19.33 with 4WW. 01/25/2021= 16.16 sec using 4WW. 02/15/2021- Will assess next visit    Time 12    Period Weeks    Status On-going    Target Date 05/10/21      PT LONG TERM GOAL #5   Title Patient will increase ABC scale score >80% to demonstrate better functional mobility and better confidence with ADLs.    Baseline 02/08: 48.25%; 11/10/2020= 53.13. 02/15/2021=56.25    Time 12    Period Weeks    Status On-going    Target Date 05/10/21      PT LONG TERM GOAL #6   Title Patient will demonstrate improved 6 min walk test from only able  to complete 100 feet in  prior to stoppage from fatigue to completing test at 550 feet or more without rest break, reporting some difficulty or less to improve walking tolerance with community ambulation including walking to dinining hall at independent living facility, grocery shopping, going to church,etc.    Baseline 09/26/2020= 100 feet in 1 min using 4WW- stopped due to fatigue. 11/10/2020= 555 feet with 4WW in 4 min 28 sec.  01/25/2021= 250 feet in 2 min 30 sec using 4WW- stopped due to fatigue today. 02/15/2021= 200 feet in 1 min 53 sec using 4WW- Patient reports fatigue as well as active Lupus Flare up.    Time 12    Period Weeks    Status On-going    Target Date 05/10/21                   Plan - 03/21/21 1406     Clinical Impression Statement Patient educated on need for compliance with Hep for improvements in mobility. Patient short of breathe requiring monitoring of Sp02 throughout session. She has increased trunk flexion with prolonged standing reporting back pain in upright position. Shortness of breath remains primary limitation during session. She will benefit from further skilled therapy to improve LE strength and to improve endurance/functional capacity to increase quality of life.    Personal Factors and Comorbidities Age;Comorbidity 3+    Comorbidities TIA, aphasia, acute bronchitis    Examination-Activity Limitations Stairs;Squat;Bend;Stand;Transfers;Lift    Examination-Participation Restrictions Church    Stability/Clinical Decision Making Evolving/Moderate complexity    Rehab Potential Fair    PT Frequency 2x / week    PT Duration 12 weeks    PT Treatment/Interventions ADLs/Self Care Home Management;Canalith Repostioning;Moist Heat;Traction;Electrical Stimulation;Gait training;Stair training;Functional mobility training;Therapeutic activities;Therapeutic exercise;Balance training;Neuromuscular re-education;Patient/family education;Passive range of motion;Energy  conservation    PT Next Visit Plan progress strengthening and balance exercises, progress cardiovascular endurance training;  progress standing therex, progress volume of distance amb.    PT Home Exercise Plan No changes    Consulted and Agree with Plan of Care Patient             Patient will benefit from skilled therapeutic intervention in order to improve the following deficits and impairments:  Abnormal gait, Decreased endurance, Cardiopulmonary status limiting activity, Decreased activity tolerance, Decreased strength, Decreased balance, Decreased mobility, Difficulty walking, Improper body mechanics, Decreased safety awareness  Visit Diagnosis: Abnormality of gait and mobility  Muscle weakness (generalized)  Unsteadiness on feet     Problem List Patient Active Problem List   Diagnosis Date Noted   TIA (transient ischemic attack) 02/03/2020   Aphasia    Acute bronchitis 10/06/2016   Precious Bard, PT, DPT  03/21/2021, 2:29 PM  [facility] MAIN [facility] SERVICES [address] [place], [place], [address] Phone: [phone]   Fax:  [phone]  Name: MING KUNKA MRN: [id number] Date of Birth: July 27, 1940

## 2021-03-28 ENCOUNTER — Ambulatory Visit: Payer: Medicare Other

## 2021-03-28 ENCOUNTER — Other Ambulatory Visit: Payer: Self-pay

## 2021-03-28 DIAGNOSIS — M6281 Muscle weakness (generalized): Secondary | ICD-10-CM

## 2021-03-28 DIAGNOSIS — R278 Other lack of coordination: Secondary | ICD-10-CM

## 2021-03-28 DIAGNOSIS — R269 Unspecified abnormalities of gait and mobility: Secondary | ICD-10-CM

## 2021-03-28 DIAGNOSIS — R262 Difficulty in walking, not elsewhere classified: Secondary | ICD-10-CM

## 2021-03-28 NOTE — Therapy (Signed)
[facility] MAIN [facility] SERVICES [address] [place], [place], [address] Phone: [phone]   Fax:  [phone]  Physical Therapy Treatment  Patient Details  Name: Kara Pennington MRN: [id number] Date of Birth: 09-18-1940 Referring Provider (PT): Dr. Lanny Cramp   Encounter Date: 03/28/2021   PT End of Session - 03/28/21 1356     Visit Number 35    Number of Visits 56    Date for PT Re-Evaluation 05/10/21    Authorization Type eval: 02/08; PN and Recert on 11/10/2020- 02/02/2021; PN on 01/25/2021, Recert 02/15/2021-05/10/2021    PT Start Time 1350    PT Stop Time 1435    PT Time Calculation (min) 45 min    Equipment Utilized During Treatment Gait belt    Activity Tolerance Patient tolerated treatment well;Patient limited by fatigue    Behavior During Therapy WFL for tasks assessed/performed             Past Medical History:  Diagnosis Date   Anxiety    Depression    GERD (gastroesophageal reflux disease)    Hypertension     Past Surgical History:  Procedure Laterality Date   APPENDECTOMY     PARATHYROIDECTOMY      There were no vitals filed for this visit.   Subjective Assessment - 03/28/21 1353     Subjective Patient reports she is slow and limited on energy but hanging in there    Patient is accompained by: Family member    Pertinent History 80 y.o. female presents to skilled physical therapy with a history of COVID-19 infection 1 year ago, hypothyroidism, and fall on 07/15/2020 with resultant nondisplaced right temporal bone fracture with associated minimal left parietal subarachnoid hemorrhage and a resolved right frontotemporal subdural hematoma who presented to [facility] hospital on 07/23/20 with intermitten but worsening word finding difficulties and expressive aphasia. While in the hospital, she had approximately 24 hours of speaking "in gibberish" and unable to speak. After about 1 day, it was much improved and she was  transferred to [facility]. She normally lives in an independent living apartment. During the week, daughter stated that she was overall doing better though she still would mix up words. On the day of admit, the daughter spoke to her at around noon and she said they had a "normal" conversation. She arrived at [facility] to visit with her at 2:15 and immediately felt that her mother was not well. She was back to speaking non-sensically. Daughter requested return to [facility]. Upon arrival she was able to follow commands and answer some questions accurately, however, she was having considerable word finding difficulties and seemed somewhat frustrated. 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    Limitations Standing;Walking    How long can you sit comfortably? unlimited time    How long can you stand comfortably? less than 5 mins    How long can you walk comfortably? less than 5 mins    Patient Stated Goals "I want to be able to get back to church and rejoin the knitting club"    Currently in Pain? No/denies             INTERVENTIONS:    Therapeutic Exercises:    Seated LE strengthening:  all with 2.5 ankle weight  BLE Seated LAQ  10 reps x each LE 3 second holds - 4-6/10 on RPE Seated hip march x 10 reps each LE with 3 sec  hold- 3/10 on RPE  Seated heel raise 12x- 3/10 on RPE Seated ham curls with GTB x 12 reps- RPE= 5/10 Seated hip swing up and over pink hedgehog- Patient rates as hard. 12x each LE 02 sat measured several times while exercises and measured 95% or higher throughout workout.     2 min step test - Patient able to perform 16 steps in 56 sec - Stopped due to SOB- O2 sat =95% on room air and HR= 103 bpm. Patient able to perform pursed lip breathing   2 min static stand without UE support- Patient able to complete with no UE Support and reported not as fatiguing as the strengthening exercises.   Dynamic walking-  Patient ambulated around 120 feet; 100 feet, and 75 feet using 4WW with CGA - Good reciprocal steps yet reverts back to leaning on her forearms on walker after approx 150 feet of distance. She breathes hard but O2 sat maintained at 95% or above throughout walk today. She reports overall fatigue as limiting factor.   Clinical Impression: Patient continues to fatigue quickly with all seated and standing activities- reports more SOB initially yet later in visit- states more limited by overall fatigue. She was able to use the RPE scale well and stopped appropriately- not overexerting herself. She will benefit from further skilled therapy to improve LE strength and to improve endurance/functional capacity to increase quality of life.                       PT Education - 03/28/21 1355     Education Details Exercise technique    Person(s) Educated Patient    Methods Explanation;Demonstration;Tactile cues;Verbal cues    Comprehension Verbalized understanding;Returned demonstration;Verbal cues required;Tactile cues required;Need further instruction              PT Short Term Goals - 11/10/20 6063       PT SHORT TERM GOAL #1   Title Patient will be independent in home exercise program to improve strength/mobility for better functional independence with ADLs.    Baseline 09/26/2020- Patient requires cues for Breathing techniques with walking and reminders to use the BORG RPE scale. She is knowledgeable of seated LE strengthening but will continue to benefit from progression of HEP to more progressive exercises. 11/10/2020- Patient able to verbalize and demo good working knowledge of breathing technqiues, BORG Scale, and seated/standing therex along with walking program with no questions or concerns at this time.    Time 6    Period Weeks    Status Achieved    Target Date 09/27/20               PT Long Term Goals - 02/15/21 0915       PT LONG TERM GOAL #1   Title Patient  will increase FOTO score to equal to or greater than to 61 demonstrate statistically significant improvement in mobility and quality of life.    Baseline 02/08: 45; 11/10/2020= 49; 02/15/2021=59%    Time 12    Period Weeks    Status On-going    Target Date 05/10/21      PT LONG TERM GOAL #2   Title Patient (> 55 years old) will complete five times sit to stand test in < 15 seconds indicating an increased LE strength and improved balance.    Baseline 02/08: 19s without BUE support. 09/26/2020= 16.32 sec without UE support. 11/10/2020= 17.03 sec without UE support.  01/25/2021- 13.87 sec without UE  Support- will keep goal active to ensure patient remains consistent. 02/15/2021= 11.8 sec without UE support    Time 12    Period Weeks    Status Achieved      PT LONG TERM GOAL #3   Title Patient will increase 10 meter walk test to >1.62m/s as to improve gait speed for better community ambulation and to reduce fall risk.    Baseline 02/08:0.37 m/s. 09/26/2020= 0.59 m/s using 4WW. 11/10/2020= 0.75 m/s using 4WW. 01/25/2021= 0.68 m/s using 4WW. 02/15/2021= 0.71 m/s using 4WW    Time 12    Period Weeks    Status On-going    Target Date 05/10/21      PT LONG TERM GOAL #4   Title Patient will reduce timed up and go to <11 seconds to reduce fall risk and demonstrate improved transfer/gait ability.    Baseline 02/08: 33.43 with 2WW. 09/26/2020= 22.18 sec with 3ZJ; 11/10/2020=19.33 with 4WW. 01/25/2021= 16.16 sec using 4WW. 02/15/2021- Will assess next visit    Time 12    Period Weeks    Status On-going    Target Date 05/10/21      PT LONG TERM GOAL #5   Title Patient will increase ABC scale score >80% to demonstrate better functional mobility and better confidence with ADLs.    Baseline 02/08: 48.25%; 11/10/2020= 53.13. 02/15/2021=56.25    Time 12    Period Weeks    Status On-going    Target Date 05/10/21      PT LONG TERM GOAL #6   Title Patient will demonstrate improved 6 min walk test from only able to  complete 100 feet in  prior to stoppage from fatigue to completing test at 550 feet or more without rest break, reporting some difficulty or less to improve walking tolerance with community ambulation including walking to dinining hall at independent living facility, grocery shopping, going to church,etc.    Baseline 09/26/2020= 100 feet in 1 min using 4WW- stopped due to fatigue. 11/10/2020= 555 feet with 4WW in 4 min 28 sec.  01/25/2021= 250 feet in 2 min 30 sec using 4WW- stopped due to fatigue today. 02/15/2021= 200 feet in 1 min 53 sec using 4WW- Patient reports fatigue as well as active Lupus Flare up.    Time 12    Period Weeks    Status On-going    Target Date 05/10/21                   Plan - 03/28/21 1356     Clinical Impression Statement Patient continues to fatigue quickly with all seated and standing activities- reports more SOB initially yet later in visit- states more limited by overall fatigue. She was able to use the RPE scale well and stopped appropriately- not overexerting herself. She will benefit from further skilled therapy to improve LE strength and to improve endurance/functional capacity to increase quality of life.    Personal Factors and Comorbidities Age;Comorbidity 3+    Comorbidities TIA, aphasia, acute bronchitis    Examination-Activity Limitations Stairs;Squat;Bend;Stand;Transfers;Lift    Examination-Participation Restrictions Church    Stability/Clinical Decision Making Evolving/Moderate complexity    Rehab Potential Fair    PT Frequency 2x / week    PT Duration 12 weeks    PT Treatment/Interventions ADLs/Self Care Home Management;Canalith Repostioning;Moist Heat;Traction;Electrical Stimulation;Gait training;Stair training;Functional mobility training;Therapeutic activities;Therapeutic exercise;Balance training;Neuromuscular re-education;Patient/family education;Passive range of motion;Energy conservation    PT Next Visit Plan progress strengthening and  balance exercises, progress cardiovascular endurance training;  progress standing therex, progress volume of distance amb.    PT Home Exercise Plan No changes    Consulted and Agree with Plan of Care Patient             Patient will benefit from skilled therapeutic intervention in order to improve the following deficits and impairments:  Abnormal gait, Decreased endurance, Cardiopulmonary status limiting activity, Decreased activity tolerance, Decreased strength, Decreased balance, Decreased mobility, Difficulty walking, Improper body mechanics, Decreased safety awareness  Visit Diagnosis: Abnormality of gait and mobility  Difficulty in walking, not elsewhere classified  Muscle weakness (generalized)  Other lack of coordination     Problem List Patient Active Problem List   Diagnosis Date Noted   TIA (transient ischemic attack) 02/03/2020   Aphasia    Acute bronchitis 10/06/2016    Lenda Kelp, PT 03/28/2021, 3:37 PM  [facility] MAIN [facility] SERVICES [address] [place], [place], [address] Phone: [phone]   Fax:  [phone]  Name: Kara Pennington MRN: [id number] Date of Birth: 18-Aug-1940

## 2021-04-04 ENCOUNTER — Other Ambulatory Visit: Payer: Self-pay

## 2021-04-04 ENCOUNTER — Ambulatory Visit: Payer: Medicare Other

## 2021-04-04 DIAGNOSIS — R2689 Other abnormalities of gait and mobility: Secondary | ICD-10-CM

## 2021-04-04 DIAGNOSIS — R262 Difficulty in walking, not elsewhere classified: Secondary | ICD-10-CM

## 2021-04-04 DIAGNOSIS — R296 Repeated falls: Secondary | ICD-10-CM

## 2021-04-04 DIAGNOSIS — R269 Unspecified abnormalities of gait and mobility: Secondary | ICD-10-CM

## 2021-04-04 DIAGNOSIS — R2681 Unsteadiness on feet: Secondary | ICD-10-CM

## 2021-04-04 DIAGNOSIS — R278 Other lack of coordination: Secondary | ICD-10-CM

## 2021-04-04 DIAGNOSIS — M6281 Muscle weakness (generalized): Secondary | ICD-10-CM

## 2021-04-04 NOTE — Therapy (Signed)
[facility] MAIN [facility] SERVICES [address] [place], [place], [address] Phone: [phone]   Fax:  [phone]  Physical Therapy Treatment  Patient Details  Name: Kara Pennington MRN: [id number] Date of Birth: 1940-07-19 Referring Provider (PT): Dr. Lanny Cramp   Encounter Date: 04/04/2021   PT End of Session - 04/04/21 1357     Visit Number 36    Number of Visits 56    Date for PT Re-Evaluation 05/10/21    Authorization Type Medicare Primary; [employer] Secondary    Authorization Time Period eval: 02/08; PN and Recert on 11/10/2020- 02/02/2021; PN on 01/25/2021, Recert 02/15/2021-05/10/2021    Progress Note Due on Visit 40    PT Start Time 1347    PT Stop Time 1427    PT Time Calculation (min) 40 min    Activity Tolerance Patient tolerated treatment well;Patient limited by fatigue    Behavior During Therapy WFL for tasks assessed/performed             Past Medical History:  Diagnosis Date   Anxiety    Depression    GERD (gastroesophageal reflux disease)    Hypertension     Past Surgical History:  Procedure Laterality Date   APPENDECTOMY     PARATHYROIDECTOMY      There were no vitals filed for this visit.   Subjective Assessment - 04/04/21 1353     Subjective Pt doing fairly well. She's got her new hearing aids now 2 hours ago, getting used to it. Her HEP compliance has been limited by pain or exhaustion. Her stamina remains limited.    Pertinent History 80 y.o. female presents to skilled physical therapy with a history of COVID-19 infection 1 year ago, hypothyroidism, and fall on 07/15/2020 with resultant nondisplaced right temporal bone fracture with associated minimal left parietal subarachnoid hemorrhage and a resolved right frontotemporal subdural hematoma who presented to [facility] hospital on 07/23/20 with intermitten but worsening word finding difficulties and expressive aphasia. While in the hospital, she had approximately 24  hours of speaking "in gibberish" and unable to speak. After about 1 day, it was much improved and she was transferred to [facility] and Rehab. She normally lives in an independent living apartment. During the week, daughter stated that she was overall doing better though she still would mix up words. On the day of admit, the daughter spoke to her at around noon and she said they had a "normal" conversation. She arrived at [facility] to visit with her at 2:15 and immediately felt that her mother was not well. She was back to speaking non-sensically. Daughter requested return to [facility]. Upon arrival she was able to follow commands and answer some questions accurately, however, she was having considerable word finding difficulties and seemed somewhat frustrated. 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    Currently in Pain? No/denies             INTERVENTION THIS DATE:  Seated LE strengthening:  all with 2.5 ankle weight  BLE -Seated LAQ 10 reps x each LE 3 second holds  -Seated hip march x 10 reps each LE with 3 sec hold  -Seated heel raise 15x -Seated toe raises 15x -Seated ham curls with GTB x 12 reps- RPE= 5/10 -Green TB seated clam (ball between shoes) 1x15x3secH  -Seated isometric hip IR bilat (gait belt around ankles ball between knees) 10x3secH -STS from chair, hands on thighs 1x10 -Lateral stepping in //  bars, 2 round trips c BUE support  -Lateral stepping in // bars, 2 round trips c BUE support  -Supervisied AMB back to front door due to fatigue and limited activity tolerance at end of session.         PT Education - 04/04/21 1355     Education Details Technique with exerciss today.    Person(s) Educated Patient    Methods Explanation    Comprehension Verbalized understanding;Returned demonstration              PT Short Term Goals - 11/10/20 0609       PT SHORT TERM GOAL #1   Title Patient will be independent  in home exercise program to improve strength/mobility for better functional independence with ADLs.    Baseline 09/26/2020- Patient requires cues for Breathing techniques with walking and reminders to use the BORG RPE scale. She is knowledgeable of seated LE strengthening but will continue to benefit from progression of HEP to more progressive exercises. 11/10/2020- Patient able to verbalize and demo good working knowledge of breathing technqiues, BORG Scale, and seated/standing therex along with walking program with no questions or concerns at this time.    Time 6    Period Weeks    Status Achieved    Target Date 09/27/20               PT Long Term Goals - 02/15/21 0915       PT LONG TERM GOAL #1   Title Patient will increase FOTO score to equal to or greater than to 61 demonstrate statistically significant improvement in mobility and quality of life.    Baseline 02/08: 45; 11/10/2020= 49; 02/15/2021=59%    Time 12    Period Weeks    Status On-going    Target Date 05/10/21      PT LONG TERM GOAL #2   Title Patient (> 3 years old) will complete five times sit to stand test in < 15 seconds indicating an increased LE strength and improved balance.    Baseline 02/08: 19s without BUE support. 09/26/2020= 16.32 sec without UE support. 11/10/2020= 17.03 sec without UE support.  01/25/2021- 13.87 sec without UE Support- will keep goal active to ensure patient remains consistent. 02/15/2021= 11.8 sec without UE support    Time 12    Period Weeks    Status Achieved      PT LONG TERM GOAL #3   Title Patient will increase 10 meter walk test to >1.74m/s as to improve gait speed for better community ambulation and to reduce fall risk.    Baseline 02/08:0.37 m/s. 09/26/2020= 0.59 m/s using 4WW. 11/10/2020= 0.75 m/s using 4WW. 01/25/2021= 0.68 m/s using 4WW. 02/15/2021= 0.71 m/s using 4WW    Time 12    Period Weeks    Status On-going    Target Date 05/10/21      PT LONG TERM GOAL #4   Title Patient will  reduce timed up and go to <11 seconds to reduce fall risk and demonstrate improved transfer/gait ability.    Baseline 02/08: 33.43 with 2WW. 09/26/2020= 22.18 sec with 6FK; 11/10/2020=19.33 with 4WW. 01/25/2021= 16.16 sec using 4WW. 02/15/2021- Will assess next visit    Time 12    Period Weeks    Status On-going    Target Date 05/10/21      PT LONG TERM GOAL #5   Title Patient will increase ABC scale score >80% to demonstrate better functional mobility and better confidence with ADLs.  Baseline 02/08: 48.25%; 11/10/2020= 53.13. 02/15/2021=56.25    Time 12    Period Weeks    Status On-going    Target Date 05/10/21      PT LONG TERM GOAL #6   Title Patient will demonstrate improved 6 min walk test from only able to complete 100 feet in  prior to stoppage from fatigue to completing test at 550 feet or more without rest break, reporting some difficulty or less to improve walking tolerance with community ambulation including walking to dinining hall at independent living facility, grocery shopping, going to church,etc.    Baseline 09/26/2020= 100 feet in 1 min using 4WW- stopped due to fatigue. 11/10/2020= 555 feet with 4WW in 4 min 28 sec.  01/25/2021= 250 feet in 2 min 30 sec using 4WW- stopped due to fatigue today. 02/15/2021= 200 feet in 1 min 53 sec using 4WW- Patient reports fatigue as well as active Lupus Flare up.    Time 12    Period Weeks    Status On-going    Target Date 05/10/21                   Plan - 04/04/21 1359     Clinical Impression Statement Continued with POC this visit. Worked on general strengthening in legs and progressing tolerance to ambulatory activity. Pt limited by quick onset SOB with activity, without hypoxia, with consistent recovery time. Pt given frequent seated recovery intervals to address elevated RR.    Personal Factors and Comorbidities Age;Comorbidity 3+    Comorbidities TIA, aphasia, acute bronchitis    Examination-Activity Limitations  Stairs;Squat;Bend;Stand;Transfers;Lift    Examination-Participation Restrictions Church    Stability/Clinical Decision Making Evolving/Moderate complexity    Clinical Decision Making Moderate    Rehab Potential Fair    PT Frequency 2x / week    PT Duration 12 weeks    PT Treatment/Interventions ADLs/Self Care Home Management;Canalith Repostioning;Moist Heat;Traction;Electrical Stimulation;Gait training;Stair training;Functional mobility training;Therapeutic activities;Therapeutic exercise;Balance training;Neuromuscular re-education;Patient/family education;Passive range of motion;Energy conservation    PT Next Visit Plan progress strengthening and balance exercises, progress cardiovascular endurance training; progress standing therex, progress volume of distance amb.    PT Home Exercise Plan No updates today, walking for meals conitnues to be a big challenge    Consulted and Agree with Plan of Care Patient             Patient will benefit from skilled therapeutic intervention in order to improve the following deficits and impairments:  Abnormal gait, Decreased endurance, Cardiopulmonary status limiting activity, Decreased activity tolerance, Decreased strength, Decreased balance, Decreased mobility, Difficulty walking, Improper body mechanics, Decreased safety awareness  Visit Diagnosis: Abnormality of gait and mobility  Difficulty in walking, not elsewhere classified  Muscle weakness (generalized)  Other lack of coordination  Unsteadiness on feet  Other abnormalities of gait and mobility  Gait abnormality  Repeated falls     Problem List Patient Active Problem List   Diagnosis Date Noted   TIA (transient ischemic attack) 02/03/2020   Aphasia    Acute bronchitis 10/06/2016   2:22 PM, 04/04/21 Rosamaria Lints, PT, DPT Physical Therapist - [facility]  Outpatient Physical Therapy- Main Campus [phone]      [place],  PT 04/04/2021, 2:04 PM  [facility] MAIN [facility] SERVICES [address] [place], [place], [address] Phone: [phone]   Fax:  [phone]  Name: Kara Pennington MRN: [id number] Date of Birth: 1941/06/02

## 2021-04-13 ENCOUNTER — Other Ambulatory Visit: Payer: Self-pay

## 2021-04-13 ENCOUNTER — Ambulatory Visit: Payer: Medicare Other | Attending: Physical Medicine and Rehabilitation

## 2021-04-13 DIAGNOSIS — M6281 Muscle weakness (generalized): Secondary | ICD-10-CM | POA: Diagnosis present

## 2021-04-13 DIAGNOSIS — R2681 Unsteadiness on feet: Secondary | ICD-10-CM | POA: Diagnosis present

## 2021-04-13 DIAGNOSIS — R262 Difficulty in walking, not elsewhere classified: Secondary | ICD-10-CM | POA: Insufficient documentation

## 2021-04-13 DIAGNOSIS — R269 Unspecified abnormalities of gait and mobility: Secondary | ICD-10-CM | POA: Insufficient documentation

## 2021-04-13 NOTE — Therapy (Signed)
[facility] MAIN [facility] SERVICES [address] [place], [place], [address] Phone: [phone]   Fax:  [phone]  Physical Therapy Treatment  Patient Details  Name: Kara Pennington MRN: [id number] Date of Birth: [birth year] Referring Provider (PT): Dr. Lanny Cramp   Encounter Date: 04/13/2021   PT End of Session - 04/13/21 0910     Visit Number 37    Number of Visits 56    Date for PT Re-Evaluation 05/10/21    Authorization Type Medicare Primary; [employer] Secondary    Authorization Time Period eval: 02/08; PN and Recert on 11/10/2020- 02/02/2021; PN on 01/25/2021, Recert 02/15/2021-05/10/2021    Progress Note Due on Visit 40    PT Start Time 0903    PT Stop Time 0944    PT Time Calculation (min) 41 min    Equipment Utilized During Treatment Gait belt    Activity Tolerance Patient tolerated treatment well;Patient limited by fatigue    Behavior During Therapy WFL for tasks assessed/performed             Past Medical History:  Diagnosis Date   Anxiety    Depression    GERD (gastroesophageal reflux disease)    Hypertension     Past Surgical History:  Procedure Laterality Date   APPENDECTOMY     PARATHYROIDECTOMY      There were no vitals filed for this visit.   Subjective Assessment - 04/13/21 0908     Subjective Patient reports feeling "achy all over for about a week." Denies any fever or stomach issues.    Pertinent History 80 y.o. female presents to skilled physical therapy with a history of COVID-19 infection 1 year ago, hypothyroidism, and fall on 07/15/2020 with resultant nondisplaced right temporal bone fracture with associated minimal left parietal subarachnoid hemorrhage and a resolved right frontotemporal subdural hematoma who presented to [facility] hospital on 07/23/20 with intermitten but worsening word finding difficulties and expressive aphasia. While in the hospital, she had approximately 24 hours of speaking "in gibberish"  and unable to speak. After about 1 day, it was much improved and she was transferred to [facility]. She normally lives in an independent living apartment. During the week, daughter stated that she was overall doing better though she still would mix up words. On the day of admit, the daughter spoke to her at around noon and she said they had a "normal" conversation. She arrived at [facility] to visit with her at 2:15 and immediately felt that her mother was not well. She was back to speaking non-sensically. Daughter requested return to [facility]. Upon arrival she was able to follow commands and answer some questions accurately, however, she was having considerable word finding difficulties and seemed somewhat frustrated. 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    Limitations Standing;Walking    How long can you sit comfortably? unlimited time    How long can you stand comfortably? less than 5 mins    How long can you walk comfortably? less than 5 mins    Patient Stated Goals "I want to be able to get back to church and rejoin the knitting club"    Currently in Pain? No/denies             INTERVENTIONS:   Therapeutic Exercises:   Nustep L0 LE only x 5 min - focusing on cardio with frequent check of vitals. O2 sat at rest =98; HR =64 bpm; after 3  min= O2 sat= 97 % and HR=67 bpm   Seated LE strengthening:  all with 2.5 ankle weight  BLE -Seated LAQ 15 reps x each LE 3 second hold. *Patient rates as "medium"  -Seated hip march/step tap (stacked purple pad on top of blue airex pad)  using 2.5 lb. 2  x 10 reps each LE  -Seated heel raise  2x15x -Seated toe raises  2x 15x (VC to hold contraction for 3 sec hold)  -Seated ham curls with matrix cable system at 7.5 lb 2 sets of 10 reps- RPE= 6/10 --STS from chair, hands on thighs 1x10 and 4 without UE support.  -Forward/retro gait in // bars, 2 round trips without  BUE support - Patient  described Backward walk as "Very Difficult" Education provided throughout session via VC/TC and demonstration to facilitate movement at target joints and correct muscle activation for all  exercises performed.    Clinical Impression: Patient responded well to seated therex-able to progress to more resistive exercises including matrix cable system and standing without UE support. She denied increased ache with exercises today. She will benefit from further skilled therapy to improve LE strength and to improve endurance/functional capacity to increase quality of life.                       PT Education - 04/13/21 0910     Education Details Exercise technique    Person(s) Educated Patient    Methods Explanation;Demonstration;Tactile cues;Verbal cues    Comprehension Tactile cues required;Returned demonstration;Verbal cues required;Need further instruction;Verbalized understanding              PT Short Term Goals - 11/10/20 2863       PT SHORT TERM GOAL #1   Title Patient will be independent in home exercise program to improve strength/mobility for better functional independence with ADLs.    Baseline 09/26/2020- Patient requires cues for Breathing techniques with walking and reminders to use the BORG RPE scale. She is knowledgeable of seated LE strengthening but will continue to benefit from progression of HEP to more progressive exercises. 11/10/2020- Patient able to verbalize and demo good working knowledge of breathing technqiues, BORG Scale, and seated/standing therex along with walking program with no questions or concerns at this time.    Time 6    Period Weeks    Status Achieved    Target Date 09/27/20               PT Long Term Goals - 02/15/21 0915       PT LONG TERM GOAL #1   Title Patient will increase FOTO score to equal to or greater than to 61 demonstrate statistically significant improvement in mobility and quality of life.    Baseline 02/08: 45;  11/10/2020= 49; 02/15/2021=59%    Time 12    Period Weeks    Status On-going    Target Date 05/10/21      PT LONG TERM GOAL #2   Title Patient (> 12 years old) will complete five times sit to stand test in < 15 seconds indicating an increased LE strength and improved balance.    Baseline 02/08: 19s without BUE support. 09/26/2020= 16.32 sec without UE support. 11/10/2020= 17.03 sec without UE support.  01/25/2021- 13.87 sec without UE Support- will keep goal active to ensure patient remains consistent. 02/15/2021= 11.8 sec without UE support    Time 12    Period Weeks    Status Achieved  PT LONG TERM GOAL #3   Title Patient will increase 10 meter walk test to >1.45m/s as to improve gait speed for better community ambulation and to reduce fall risk.    Baseline 02/08:0.37 m/s. 09/26/2020= 0.59 m/s using 4WW. 11/10/2020= 0.75 m/s using 4WW. 01/25/2021= 0.68 m/s using 4WW. 02/15/2021= 0.71 m/s using 4WW    Time 12    Period Weeks    Status On-going    Target Date 05/10/21      PT LONG TERM GOAL #4   Title Patient will reduce timed up and go to <11 seconds to reduce fall risk and demonstrate improved transfer/gait ability.    Baseline 02/08: 33.43 with 2WW. 09/26/2020= 22.18 sec with 1HY; 11/10/2020=19.33 with 4WW. 01/25/2021= 16.16 sec using 4WW. 02/15/2021- Will assess next visit    Time 12    Period Weeks    Status On-going    Target Date 05/10/21      PT LONG TERM GOAL #5   Title Patient will increase ABC scale score >80% to demonstrate better functional mobility and better confidence with ADLs.    Baseline 02/08: 48.25%; 11/10/2020= 53.13. 02/15/2021=56.25    Time 12    Period Weeks    Status On-going    Target Date 05/10/21      PT LONG TERM GOAL #6   Title Patient will demonstrate improved 6 min walk test from only able to complete 100 feet in  prior to stoppage from fatigue to completing test at 550 feet or more without rest break, reporting some difficulty or less to improve walking  tolerance with community ambulation including walking to dinining hall at independent living facility, grocery shopping, going to church,etc.    Baseline 09/26/2020= 100 feet in 1 min using 4WW- stopped due to fatigue. 11/10/2020= 555 feet with 4WW in 4 min 28 sec.  01/25/2021= 250 feet in 2 min 30 sec using 4WW- stopped due to fatigue today. 02/15/2021= 200 feet in 1 min 53 sec using 4WW- Patient reports fatigue as well as active Lupus Flare up.    Time 12    Period Weeks    Status On-going    Target Date 05/10/21                   Plan - 04/13/21 0911     Clinical Impression Statement Patient responded well to seated therex-able to progress to more resistive exercises including matrix cable system and standing without UE support. She denied ?increased ache with exercises today. She will benefit from further skilled therapy to improve LE strength and to improve endurance/functional capacity to increase quality of life.    Personal Factors and Comorbidities Age;Comorbidity 3+    Comorbidities TIA, aphasia, acute bronchitis    Examination-Activity Limitations Stairs;Squat;Bend;Stand;Transfers;Lift    Examination-Participation Restrictions Church    Stability/Clinical Decision Making Evolving/Moderate complexity    Rehab Potential Fair    PT Frequency 2x / week    PT Duration 12 weeks    PT Treatment/Interventions ADLs/Self Care Home Management;Canalith Repostioning;Moist Heat;Traction;Electrical Stimulation;Gait training;Stair training;Functional mobility training;Therapeutic activities;Therapeutic exercise;Balance training;Neuromuscular re-education;Patient/family education;Passive range of motion;Energy conservation    PT Next Visit Plan progress strengthening and balance exercises, progress cardiovascular endurance training; progress standing therex, progress volume of distance amb.    PT Home Exercise Plan No updates today, walking for meals conitnues to be a big challenge    Consulted  and Agree with Plan of Care Patient  Patient will benefit from skilled therapeutic intervention in order to improve the following deficits and impairments:  Abnormal gait, Decreased endurance, Cardiopulmonary status limiting activity, Decreased activity tolerance, Decreased strength, Decreased balance, Decreased mobility, Difficulty walking, Improper body mechanics, Decreased safety awareness  Visit Diagnosis: Abnormality of gait and mobility  Difficulty in walking, not elsewhere classified  Muscle weakness (generalized)  Unsteadiness on feet     Problem List Patient Active Problem List   Diagnosis Date Noted   TIA (transient ischemic attack) 02/03/2020   Aphasia    Acute bronchitis 10/06/2016    Lenda Kelp, PT 04/14/2021, 8:13 AM  [facility] MAIN [facility] SERVICES [address] [place], [place], [address] Phone: [phone]   Fax:  [phone]  Name: DACIE MANDEL MRN: [id number] Date of Birth: 1940-11-28

## 2021-04-18 ENCOUNTER — Ambulatory Visit: Payer: Medicare Other

## 2021-04-18 ENCOUNTER — Other Ambulatory Visit: Payer: Self-pay

## 2021-04-18 DIAGNOSIS — R269 Unspecified abnormalities of gait and mobility: Secondary | ICD-10-CM | POA: Diagnosis not present

## 2021-04-18 DIAGNOSIS — M6281 Muscle weakness (generalized): Secondary | ICD-10-CM

## 2021-04-18 DIAGNOSIS — R2681 Unsteadiness on feet: Secondary | ICD-10-CM

## 2021-04-18 DIAGNOSIS — R262 Difficulty in walking, not elsewhere classified: Secondary | ICD-10-CM

## 2021-04-18 NOTE — Therapy (Signed)
[facility] [facility] MAIN [facility] SERVICES [address] [place], [place], [address] Phone: [phone]   Fax:  [phone]  Physical Therapy Treatment  Patient Details  Name: Kara Pennington MRN: [id number] Date of Birth: [birth year] Referring Provider (PT): Dr. Lanny Cramp   Encounter Date: 04/18/2021   PT End of Session - 04/18/21 0925     Visit Number 38    Number of Visits 56    Date for PT Re-Evaluation 05/10/21    Authorization Type Medicare Primary; [employer] Secondary    Authorization Time Period eval: 02/08; PN and Recert on 11/10/2020- 02/02/2021; PN on 01/25/2021, Recert 02/15/2021-05/10/2021    Progress Note Due on Visit 40    PT Start Time 1500    PT Stop Time 1544    PT Time Calculation (min) 44 min    Equipment Utilized During Treatment Gait belt    Activity Tolerance Patient tolerated treatment well;Patient limited by fatigue    Behavior During Therapy WFL for tasks assessed/performed             Past Medical History:  Diagnosis Date   Anxiety    Depression    GERD (gastroesophageal reflux disease)    Hypertension     Past Surgical History:  Procedure Laterality Date   APPENDECTOMY     PARATHYROIDECTOMY      There were no vitals filed for this visit.   Subjective Assessment - 04/18/21 1522     Subjective Patient reports feeling alittle better but still gives out quickly with walking down to dining hall.    Pertinent History 80 y.o. female presents to skilled physical therapy with a history of COVID-19 infection 1 year ago, hypothyroidism, and fall on 07/15/2020 with resultant nondisplaced right temporal bone fracture with associated minimal left parietal subarachnoid hemorrhage and a resolved right frontotemporal subdural hematoma who presented to [facility] hospital on 07/23/20 with intermitten but worsening word finding difficulties and expressive aphasia. While in the hospital, she had approximately 24 hours of speaking "in  gibberish" and unable to speak. After about 1 day, it was much improved and she was transferred to [facility]. She normally lives in an independent living apartment. During the week, daughter stated that she was overall doing better though she still would mix up words. On the day of admit, the daughter spoke to her at around noon and she said they had a "normal" conversation. She arrived at [facility] to visit with her at 2:15 and immediately felt that her mother was not well. She was back to speaking non-sensically. Daughter requested return to [facility]. Upon arrival she was able to follow commands and answer some questions accurately, however, she was having considerable word finding difficulties and seemed somewhat frustrated. 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    Limitations Standing;Walking    How long can you sit comfortably? unlimited time    How long can you stand comfortably? less than 5 mins    How long can you walk comfortably? less than 5 mins    Patient Stated Goals "I want to be able to get back to church and rejoin the knitting club"    Currently in Pain? No/denies             INTERVENTIONS:   Therapeutic Exercises:  O2 sat = 96%; HR= 78 bpm at rest  Nustep - L3 with BUE/LE x 8 min- O2 sat= 95% and HR=98 bpm. Continuous monitoring  and reminders to keep SPM > 60.   Step tap for 2 min in // bars (36 steps counting every RLE step) alternating with BUE support. Patient reports 7/10 RPE today. O2 sat=94% and HR=108 bpm- back down to 98% and HR = 79 after 2 min of pursed lip breathing.   Forwad/retro walking in the // bars - down and back x 4 - then brief rest break followed by 4 more laps. Patient reports RPE at 7/10.   Sit to stand - holding onto green theraball and then performing overhead press x 10 reps. Patient reports very fatigued  after completing.   Clinical Impression: Patient was fatigued requiring  several rest breaks to complete activities but motivated to continue and able to add more resistance and time on Nustep and no hands with sit to stand today. She continues to present with fatigue as limiting factor. She will benefit from further skilled therapy to improve LE strength and to improve endurance/functional capacity to increase quality of life                          PT Education - 04/19/21 0925     Education Details Exercise technique    Person(s) Educated Patient    Methods Explanation;Demonstration;Tactile cues;Verbal cues    Comprehension Verbalized understanding;Returned demonstration;Verbal cues required;Need further instruction              PT Short Term Goals - 11/10/20 3244       PT SHORT TERM GOAL #1   Title Patient will be independent in home exercise program to improve strength/mobility for better functional independence with ADLs.    Baseline 09/26/2020- Patient requires cues for Breathing techniques with walking and reminders to use the BORG RPE scale. She is knowledgeable of seated LE strengthening but will continue to benefit from progression of HEP to more progressive exercises. 11/10/2020- Patient able to verbalize and demo good working knowledge of breathing technqiues, BORG Scale, and seated/standing therex along with walking program with no questions or concerns at this time.    Time 6    Period Weeks    Status Achieved    Target Date 09/27/20               PT Long Term Goals - 02/15/21 0915       PT LONG TERM GOAL #1   Title Patient will increase FOTO score to equal to or greater than to 61 demonstrate statistically significant improvement in mobility and quality of life.    Baseline 02/08: 45; 11/10/2020= 49; 02/15/2021=59%    Time 12    Period Weeks    Status On-going    Target Date 05/10/21      PT LONG TERM GOAL #2   Title Patient (> 67 years old) will complete five times sit to stand test in < 15 seconds indicating  an increased LE strength and improved balance.    Baseline 02/08: 19s without BUE support. 09/26/2020= 16.32 sec without UE support. 11/10/2020= 17.03 sec without UE support.  01/25/2021- 13.87 sec without UE Support- will keep goal active to ensure patient remains consistent. 02/15/2021= 11.8 sec without UE support    Time 12    Period Weeks    Status Achieved      PT LONG TERM GOAL #3   Title Patient will increase 10 meter walk test to >1.64m/s as to improve gait speed for better community ambulation and to reduce fall risk.  Baseline 02/08:0.37 m/s. 09/26/2020= 0.59 m/s using 4WW. 11/10/2020= 0.75 m/s using 4WW. 01/25/2021= 0.68 m/s using 4WW. 02/15/2021= 0.71 m/s using 4WW    Time 12    Period Weeks    Status On-going    Target Date 05/10/21      PT LONG TERM GOAL #4   Title Patient will reduce timed up and go to <11 seconds to reduce fall risk and demonstrate improved transfer/gait ability.    Baseline 02/08: 33.43 with 2WW. 09/26/2020= 22.18 sec with 5IE; 11/10/2020=19.33 with 4WW. 01/25/2021= 16.16 sec using 4WW. 02/15/2021- Will assess next visit    Time 12    Period Weeks    Status On-going    Target Date 05/10/21      PT LONG TERM GOAL #5   Title Patient will increase ABC scale score >80% to demonstrate better functional mobility and better confidence with ADLs.    Baseline 02/08: 48.25%; 11/10/2020= 53.13. 02/15/2021=56.25    Time 12    Period Weeks    Status On-going    Target Date 05/10/21      PT LONG TERM GOAL #6   Title Patient will demonstrate improved 6 min walk test from only able to complete 100 feet in  prior to stoppage from fatigue to completing test at 550 feet or more without rest break, reporting some difficulty or less to improve walking tolerance with community ambulation including walking to dinining hall at independent living facility, grocery shopping, going to church,etc.    Baseline 09/26/2020= 100 feet in 1 min using 4WW- stopped due to fatigue. 11/10/2020= 555  feet with 4WW in 4 min 28 sec.  01/25/2021= 250 feet in 2 min 30 sec using 4WW- stopped due to fatigue today. 02/15/2021= 200 feet in 1 min 53 sec using 4WW- Patient reports fatigue as well as active Lupus Flare up.    Time 12    Period Weeks    Status On-going    Target Date 05/10/21                   Plan - 04/18/21 3329     Clinical Impression Statement Patient was fatigued requiring several rest breaks to complete activities but motivated to continue and able to add more resistance and time on Nustep and no hands with sit to stand today. She continues to present with fatigue as limiting factor. She will benefit from further skilled therapy to improve LE strength and to improve endurance/functional capacity to increase quality of life    Personal Factors and Comorbidities Age;Comorbidity 3+    Comorbidities TIA, aphasia, acute bronchitis    Examination-Activity Limitations Stairs;Squat;Bend;Stand;Transfers;Lift    Examination-Participation Restrictions Church    Stability/Clinical Decision Making Evolving/Moderate complexity    Rehab Potential Fair    PT Frequency 2x / week    PT Duration 12 weeks    PT Treatment/Interventions ADLs/Self Care Home Management;Canalith Repostioning;Moist Heat;Traction;Electrical Stimulation;Gait training;Stair training;Functional mobility training;Therapeutic activities;Therapeutic exercise;Balance training;Neuromuscular re-education;Patient/family education;Passive range of motion;Energy conservation    PT Next Visit Plan progress strengthening and balance exercises, progress cardiovascular endurance training; progress standing therex, progress volume of distance amb.    PT Home Exercise Plan No updates today, walking for meals conitnues to be a big challenge    Consulted and Agree with Plan of Care Patient             Patient will benefit from skilled therapeutic intervention in order to improve the following deficits and impairments:  Abnormal  gait,  Decreased endurance, Cardiopulmonary status limiting activity, Decreased activity tolerance, Decreased strength, Decreased balance, Decreased mobility, Difficulty walking, Improper body mechanics, Decreased safety awareness  Visit Diagnosis: Abnormality of gait and mobility  Difficulty in walking, not elsewhere classified  Muscle weakness (generalized)  Unsteadiness on feet     Problem List Patient Active Problem List   Diagnosis Date Noted   TIA (transient ischemic attack) 02/03/2020   Aphasia    Acute bronchitis 10/06/2016    Lenda Kelp, PT 04/19/2021, 9:37 AM  [place] [facility] MAIN [facility] SERVICES [address] [place], [place], [address] Phone: [phone]   Fax:  [phone]  Name: JAID QUIRION MRN: [id number] Date of Birth: 1941-03-11

## 2021-04-20 ENCOUNTER — Ambulatory Visit: Payer: Medicare Other

## 2021-04-20 ENCOUNTER — Other Ambulatory Visit: Payer: Self-pay

## 2021-04-20 DIAGNOSIS — R269 Unspecified abnormalities of gait and mobility: Secondary | ICD-10-CM

## 2021-04-20 DIAGNOSIS — R2681 Unsteadiness on feet: Secondary | ICD-10-CM

## 2021-04-20 DIAGNOSIS — M6281 Muscle weakness (generalized): Secondary | ICD-10-CM

## 2021-04-20 DIAGNOSIS — R262 Difficulty in walking, not elsewhere classified: Secondary | ICD-10-CM

## 2021-04-20 NOTE — Therapy (Signed)
[facility] MAIN [facility] SERVICES [address] [place], [place], [address] Phone: [phone]   Fax:  [phone]  Physical Therapy Treatment  Patient Details  Name: Kara Pennington MRN: [id number] Date of Birth: 1941/05/03 Referring Provider (PT): Dr. Lanny Cramp   Encounter Date: 04/20/2021   PT End of Session - 04/20/21 0909     Visit Number 39    Number of Visits 56    Date for PT Re-Evaluation 05/10/21    Authorization Type Medicare Primary; [employer] Secondary    Authorization Time Period eval: 02/08; PN and Recert on 11/10/2020- 02/02/2021; PN on 01/25/2021, Recert 02/15/2021-05/10/2021    Progress Note Due on Visit 40    PT Start Time 0857    PT Stop Time 0939    PT Time Calculation (min) 42 min    Equipment Utilized During Treatment Gait belt    Activity Tolerance Patient tolerated treatment well;Patient limited by fatigue    Behavior During Therapy WFL for tasks assessed/performed             Past Medical History:  Diagnosis Date   Anxiety    Depression    GERD (gastroesophageal reflux disease)    Hypertension     Past Surgical History:  Procedure Laterality Date   APPENDECTOMY     PARATHYROIDECTOMY      There were no vitals filed for this visit.   Subjective Assessment - 04/20/21 0902     Subjective Patient reports feeling okay today. "Its early though."    Pertinent History 80 y.o. female presents to skilled physical therapy with a history of COVID-19 infection 1 year ago, hypothyroidism, and fall on 07/15/2020 with resultant nondisplaced right temporal bone fracture with associated minimal left parietal subarachnoid hemorrhage and a resolved right frontotemporal subdural hematoma who presented to [facility] hospital on 07/23/20 with intermitten but worsening word finding difficulties and expressive aphasia. While in the hospital, she had approximately 24 hours of speaking "in gibberish" and unable to speak. After about 1  day, it was much improved and she was transferred to [facility]. She normally lives in an independent living apartment. During the week, daughter stated that she was overall doing better though she still would mix up words. On the day of admit, the daughter spoke to her at around noon and she said they had a "normal" conversation. She arrived at [facility] to visit with her at 2:15 and immediately felt that her mother was not well. She was back to speaking non-sensically. Daughter requested return to [facility]. Upon arrival she was able to follow commands and answer some questions accurately, however, she was having considerable word finding difficulties and seemed somewhat frustrated. 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    Limitations Standing;Walking    How long can you sit comfortably? unlimited time    How long can you stand comfortably? less than 5 mins    How long can you walk comfortably? less than 5 mins    Patient Stated Goals "I want to be able to get back to church and rejoin the knitting club"    Currently in Pain? No/denies              INTERVENTIONS:   Therapeutic exercises:   Nustep: BUE and LE 1 min level 0 30 sec level 4 level 0 30 sec level 4  1 min level 0 30 sec level 4 1 min level 0 30  sec level 4 O2 sat remained at 95% or above and HR between 88- 99 bpm. Patient reported 6/10 on RPE scale upon completion.   Ambulation: Patient ambulated 280 feet in 2 min 32 sec (interval training with instruction to walk slow and easy for 45 sec then faster than normal for 30 sec) using her 4WW and required rest break due to undo fatigue rather than SOB then another 150 feet in 1 min 14 sec  Step test: Patient able to complete 14 steps in 1 min 14 sec with B UE Support- Patient reported stopped due to to fatigue- rated at 8/10 on RPE.     TUG= 15.11 sec using 4WW- Improving overall   Clinical  Impression: Patient responded favorably to interval training with multiple activities to promote more functional endurance. She was encouraged after completing the Nustep yet experienced more difficulty with gait endurance overall with interval training- quickly fatiguing. She was re-tested with TUG with improving results indicating decreased overall fall risk She will benefit from further skilled therapy to improve LE strength and to improve endurance/functional capacity to increase quality of life                      PT Education - 04/20/21 0903     Education Details Interval training education    Person(s) Educated Patient    Methods Explanation;Demonstration;Tactile cues;Verbal cues    Comprehension Verbalized understanding;Verbal cues required;Returned demonstration;Need further instruction              PT Short Term Goals - 11/10/20 7408       PT SHORT TERM GOAL #1   Title Patient will be independent in home exercise program to improve strength/mobility for better functional independence with ADLs.    Baseline 09/26/2020- Patient requires cues for Breathing techniques with walking and reminders to use the BORG RPE scale. She is knowledgeable of seated LE strengthening but will continue to benefit from progression of HEP to more progressive exercises. 11/10/2020- Patient able to verbalize and demo good working knowledge of breathing technqiues, BORG Scale, and seated/standing therex along with walking program with no questions or concerns at this time.    Time 6    Period Weeks    Status Achieved    Target Date 09/27/20               PT Long Term Goals - 04/20/21 0952       PT LONG TERM GOAL #1   Title Patient will increase FOTO score to equal to or greater than to 61 demonstrate statistically significant improvement in mobility and quality of life.    Baseline 02/08: 45; 11/10/2020= 49; 02/15/2021=59%    Time 12    Period Weeks    Status On-going     Target Date 05/10/21      PT LONG TERM GOAL #2   Title Patient (> 6 years old) will complete five times sit to stand test in < 15 seconds indicating an increased LE strength and improved balance.    Baseline 02/08: 19s without BUE support. 09/26/2020= 16.32 sec without UE support. 11/10/2020= 17.03 sec without UE support.  01/25/2021- 13.87 sec without UE Support- will keep goal active to ensure patient remains consistent. 02/15/2021= 11.8 sec without UE support    Time 12    Period Weeks    Status Achieved      PT LONG TERM GOAL #3   Title Patient will increase 10 meter walk test to >  1.45m/s as to improve gait speed for better community ambulation and to reduce fall risk.    Baseline 02/08:0.37 m/s. 09/26/2020= 0.59 m/s using 4WW. 11/10/2020= 0.75 m/s using 4WW. 01/25/2021= 0.68 m/s using 4WW. 02/15/2021= 0.71 m/s using 4WW    Time 12    Period Weeks    Status On-going    Target Date 05/10/21      PT LONG TERM GOAL #4   Title Patient will reduce timed up and go to <11 seconds to reduce fall risk and demonstrate improved transfer/gait ability.    Baseline 02/08: 33.43 with 2WW. 09/26/2020= 22.18 sec with 1DQ; 11/10/2020=19.33 with 4WW. 01/25/2021= 16.16 sec using 4WW. 02/15/2021- Will assess next visit; 04/20/2021= 15.11 using 4WW    Time 12    Period Weeks    Status On-going    Target Date 05/10/21      PT LONG TERM GOAL #5   Title Patient will increase ABC scale score >80% to demonstrate better functional mobility and better confidence with ADLs.    Baseline 02/08: 48.25%; 11/10/2020= 53.13. 02/15/2021=56.25    Time 12    Period Weeks    Status On-going    Target Date 05/10/21      PT LONG TERM GOAL #6   Title Patient will demonstrate improved 6 min walk test from only able to complete 100 feet in  prior to stoppage from fatigue to completing test at 550 feet or more without rest break, reporting some difficulty or less to improve walking tolerance with community ambulation including walking  to dinining hall at independent living facility, grocery shopping, going to church,etc.    Baseline 09/26/2020= 100 feet in 1 min using 4WW- stopped due to fatigue. 11/10/2020= 555 feet with 4WW in 4 min 28 sec.  01/25/2021= 250 feet in 2 min 30 sec using 4WW- stopped due to fatigue today. 02/15/2021= 200 feet in 1 min 53 sec using 4WW- Patient reports fatigue as well as active Lupus Flare up.    Time 12    Period Weeks    Status On-going    Target Date 05/10/21                   Plan - 04/20/21 0910     Clinical Impression Statement Patient responded favorably to interval training with multiple activities to promote more functional endurance. She was encouraged after completing the Nustep yet experienced more difficulty with gait endurance overall with interval training- quickly fatiguing. She was re-tested with TUG with improving results indicating decreased overall fall risk She will benefit from further skilled therapy to improve LE strength and to improve endurance/functional capacity to increase quality of life    Personal Factors and Comorbidities Age;Comorbidity 3+    Comorbidities TIA, aphasia, acute bronchitis    Examination-Activity Limitations Stairs;Squat;Bend;Stand;Transfers;Lift    Examination-Participation Restrictions Church    Stability/Clinical Decision Making Evolving/Moderate complexity    Rehab Potential Fair    PT Frequency 2x / week    PT Duration 12 weeks    PT Treatment/Interventions ADLs/Self Care Home Management;Canalith Repostioning;Moist Heat;Traction;Electrical Stimulation;Gait training;Stair training;Functional mobility training;Therapeutic activities;Therapeutic exercise;Balance training;Neuromuscular re-education;Patient/family education;Passive range of motion;Energy conservation    PT Next Visit Plan progress strengthening and balance exercises, progress cardiovascular endurance training; progress standing therex, progress volume of distance amb.    PT  Home Exercise Plan No updates today, walking for meals conitnues to be a big challenge    Consulted and Agree with Plan of Care Patient  Patient will benefit from skilled therapeutic intervention in order to improve the following deficits and impairments:  Abnormal gait, Decreased endurance, Cardiopulmonary status limiting activity, Decreased activity tolerance, Decreased strength, Decreased balance, Decreased mobility, Difficulty walking, Improper body mechanics, Decreased safety awareness  Visit Diagnosis: Abnormality of gait and mobility  Difficulty in walking, not elsewhere classified  Muscle weakness (generalized)  Unsteadiness on feet     Problem List Patient Active Problem List   Diagnosis Date Noted   TIA (transient ischemic attack) 02/03/2020   Aphasia    Acute bronchitis 10/06/2016    Lenda Kelp, PT 04/20/2021, 9:58 AM  [place] [facility] MAIN [facility] SERVICES [address] [place], [place], [address] Phone: [phone]   Fax:  [phone]  Name: Kara Pennington MRN: [id number] Date of Birth: Nov 14, 1940

## 2021-04-25 ENCOUNTER — Ambulatory Visit: Payer: Medicare Other

## 2021-04-27 ENCOUNTER — Ambulatory Visit: Payer: Medicare Other

## 2021-04-27 ENCOUNTER — Other Ambulatory Visit: Payer: Self-pay

## 2021-04-27 DIAGNOSIS — R262 Difficulty in walking, not elsewhere classified: Secondary | ICD-10-CM

## 2021-04-27 DIAGNOSIS — M6281 Muscle weakness (generalized): Secondary | ICD-10-CM

## 2021-04-27 DIAGNOSIS — R269 Unspecified abnormalities of gait and mobility: Secondary | ICD-10-CM

## 2021-04-27 DIAGNOSIS — R2681 Unsteadiness on feet: Secondary | ICD-10-CM

## 2021-04-27 NOTE — Therapy (Signed)
[place] MAIN [facility] SERVICES [address] [place], [place], [address] Phone: [phone]   Fax:  [phone]  Physical Therapy Treatment/Physical Therapy Progress Note   Dates of reporting period  01/25/2021   to   04/27/2021  Patient Details  Name: Kara Pennington MRN: [id number] Date of Birth: 29-Nov-1940 Referring Provider (PT): Dr. Lanny Cramp   Encounter Date: 04/27/2021   PT End of Session - 04/27/21 0901     Visit Number 40    Number of Visits 56    Date for PT Re-Evaluation 05/10/21    Authorization Type Medicare Primary; [employer] Secondary    Authorization Time Period eval: 02/08; PN and Recert on 11/10/2020- 02/02/2021; PN on 01/25/2021, Recert 02/15/2021-05/10/2021    Progress Note Due on Visit 40    PT Start Time 0857    PT Stop Time 0940    PT Time Calculation (min) 43 min    Equipment Utilized During Treatment Gait belt    Activity Tolerance Patient tolerated treatment well;Patient limited by fatigue    Behavior During Therapy WFL for tasks assessed/performed             Past Medical History:  Diagnosis Date   Anxiety    Depression    GERD (gastroesophageal reflux disease)    Hypertension     Past Surgical History:  Procedure Laterality Date   APPENDECTOMY     PARATHYROIDECTOMY      There were no vitals filed for this visit.   Subjective Assessment - 04/27/21 0900     Subjective Patient reports she has a meeting right after her PT session this morning and states it was easier for her to come in her wheelchair to get to her next appt. She denies any pain, dizziness, or falls.    Pertinent History 80 y.o. female presents to skilled physical therapy with a history of COVID-19 infection 1 year ago, hypothyroidism, and fall on 07/15/2020 with resultant nondisplaced right temporal bone fracture with associated minimal left parietal subarachnoid hemorrhage and a resolved right frontotemporal subdural hematoma who  presented to [facility] hospital on 07/23/20 with intermitten but worsening word finding difficulties and expressive aphasia. While in the hospital, she had approximately 24 hours of speaking "in gibberish" and unable to speak. After about 1 day, it was much improved and she was transferred to [facility]. She normally lives in an independent living apartment. During the week, daughter stated that she was overall doing better though she still would mix up words. On the day of admit, the daughter spoke to her at around noon and she said they had a "normal" conversation. She arrived at [facility] to visit with her at 2:15 and immediately felt that her mother was not well. She was back to speaking non-sensically. Daughter requested return to [facility]. Upon arrival she was able to follow commands and answer some questions accurately, however, she was having considerable word finding difficulties and seemed somewhat frustrated. 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    Limitations Standing;Walking    How long can you sit comfortably? unlimited time    How long can you stand comfortably? less than 5 mins    How long can you walk comfortably? less than 5 mins    Patient Stated Goals "I want to be able to get back to church and rejoin the knitting club"    Currently in Pain? No/denies  INTERVENTIONS:     Reassessed goals for progress note   10 Meter walk test= 0.91 m/s using 4WW (improved from 0.37 m/s initially and last measured at 0.71 m/s on 8/10)   ABC Scale= 70% improved from initial score of 48.25%    6 min Walk test:  Resting O2 sat= 98%; HR= 74 bpm Patient ambulated 280 feet in 1 min 53 sec- stopped due to undo fatigue. O2 sat=95% and HR=102 BPm  2 min walk test= 34 steps with B UE Support- Patient rated activity as hard 7/10 On RPE. O2 sat=96% and HR =90 bpm    Clinical Impression: Patient presents with good  motivation and reports doing some better at home. She reports still very limited with walking but feeling some stronger. She presents with some improvement as seen while testing for progress note today. She did improve in the ABC scale indicating a significant improvement in her overall balance confidence. She scored better with overall gait speed indicating decreased risk of falling. She remains mostly limited with decreased functional endurance and unable to complete the 6 min walk test- She did improve in her distance but not time. Patient's condition has the potential to improve in response to therapy. Maximum improvement is yet to be obtained. The anticipated improvement is attainable and reasonable in a generally predictable time.            PT Education - 04/27/21 0901     Education Details Exercise technique    Person(s) Educated Patient    Methods Explanation;Demonstration;Tactile cues;Verbal cues    Comprehension Returned demonstration;Verbalized understanding;Verbal cues required;Need further instruction              PT Short Term Goals - 11/10/20 0609       PT SHORT TERM GOAL #1   Title Patient will be independent in home exercise program to improve strength/mobility for better functional independence with ADLs.    Baseline 09/26/2020- Patient requires cues for Breathing techniques with walking and reminders to use the BORG RPE scale. She is knowledgeable of seated LE strengthening but will continue to benefit from progression of HEP to more progressive exercises. 11/10/2020- Patient able to verbalize and demo good working knowledge of breathing technqiues, BORG Scale, and seated/standing therex along with walking program with no questions or concerns at this time.    Time 6    Period Weeks    Status Achieved    Target Date 09/27/20               PT Long Term Goals - 04/27/21 0904       PT LONG TERM GOAL #1   Title Patient will increase FOTO score to equal to or  greater than to 61 demonstrate statistically significant improvement in mobility and quality of life.    Baseline 02/08: 45; 11/10/2020= 49; 02/15/2021=59%    Time 12    Period Weeks    Status On-going      PT LONG TERM GOAL #2   Title Patient (> 48 years old) will complete five times sit to stand test in < 15 seconds indicating an increased LE strength and improved balance.    Baseline 02/08: 19s without BUE support. 09/26/2020= 16.32 sec without UE support. 11/10/2020= 17.03 sec without UE support.  01/25/2021- 13.87 sec without UE Support- will keep goal active to ensure patient remains consistent. 02/15/2021= 11.8 sec without UE support    Time 12    Period Weeks    Status Achieved  PT LONG TERM GOAL #3   Title Patient will increase 10 meter walk test to >1.82m/s as to improve gait speed for better community ambulation and to reduce fall risk.    Baseline 02/08:0.37 m/s. 09/26/2020= 0.59 m/s using 4WW. 11/10/2020= 0.75 m/s using 4WW. 01/25/2021= 0.68 m/s using 4WW. 02/15/2021= 0.71 m/s using 4WW; 04/27/2021= 0.91 m/s using 4WW    Time 12    Period Weeks    Status On-going      PT LONG TERM GOAL #4   Title Patient will reduce timed up and go to <11 seconds to reduce fall risk and demonstrate improved transfer/gait ability.    Baseline 02/08: 33.43 with 2WW. 09/26/2020= 22.18 sec with 3GH; 11/10/2020=19.33 with 4WW. 01/25/2021= 16.16 sec using 4WW. 02/15/2021- Will assess next visit; 04/20/2021= 15.11 using 4WW    Time 12    Period Weeks    Status On-going    Target Date 05/10/21      PT LONG TERM GOAL #5   Title Patient will increase ABC scale score >80% to demonstrate better functional mobility and better confidence with ADLs.    Baseline 02/08: 48.25%; 11/10/2020= 53.13. 02/15/2021=56.25 04/27/2021= 70%    Time 12    Period Weeks    Status On-going      PT LONG TERM GOAL #6   Title Patient will demonstrate improved 6 min walk test from only able to complete 100 feet in  prior to stoppage  from fatigue to completing test at 550 feet or more without rest break, reporting some difficulty or less to improve walking tolerance with community ambulation including walking to dinining hall at independent living facility, grocery shopping, going to church,etc.    Baseline 09/26/2020= 100 feet in 1 min using 4WW- stopped due to fatigue. 11/10/2020= 555 feet with 4WW in 4 min 28 sec.  01/25/2021= 250 feet in 2 min 30 sec using 4WW- stopped due to fatigue today. 02/15/2021= 200 feet in 1 min 53 sec using 4WW- Patient reports fatigue as well as active Lupus Flare up. 04/27/2021= 280 feet in 1 min 53 sec using 4WW.    Time 12    Period Weeks    Status On-going                   Plan - 04/27/21 0902     Clinical Impression Statement Patient presents with good motivation and reports doing some better at home. She reports still very limited with walking but feeling some stronger. She presents with some improvement as seen while testing for progress note today. She did improve in the ABC scale indicating a significant improvement in her overall balance confidence. She scored better with overall gait speed indicating decreased risk of falling. She remains mostly limited with decreased functional endurance and unable to complete the 6 min walk test- She did improve in her distance but not time. Patient's condition has the potential to improve in response to therapy. Maximum improvement is yet to be obtained. The anticipated improvement is attainable and reasonable in a generally predictable time.    Personal Factors and Comorbidities Age;Comorbidity 3+    Comorbidities TIA, aphasia, acute bronchitis    Examination-Activity Limitations Stairs;Squat;Bend;Stand;Transfers;Lift    Examination-Participation Restrictions Church    Stability/Clinical Decision Making Evolving/Moderate complexity    Rehab Potential Fair    PT Frequency 2x / week    PT Duration 12 weeks    PT Treatment/Interventions ADLs/Self  Care Home Management;Canalith Repostioning;Moist Heat;Traction;Electrical Stimulation;Gait training;Stair training;Functional mobility  training;Therapeutic activities;Therapeutic exercise;Balance training;Neuromuscular re-education;Patient/family education;Passive range of motion;Energy conservation    PT Next Visit Plan progress strengthening and balance exercises, progress cardiovascular endurance training; progress standing therex, progress volume of distance amb.    PT Home Exercise Plan No updates today, Encouraged walking to dining hall everyday as able.    Consulted and Agree with Plan of Care Patient             Patient will benefit from skilled therapeutic intervention in order to improve the following deficits and impairments:  Abnormal gait, Decreased endurance, Cardiopulmonary status limiting activity, Decreased activity tolerance, Decreased strength, Decreased balance, Decreased mobility, Difficulty walking, Improper body mechanics, Decreased safety awareness  Visit Diagnosis: Abnormality of gait and mobility  Difficulty in walking, not elsewhere classified  Muscle weakness (generalized)  Unsteadiness on feet     Problem List Patient Active Problem List   Diagnosis Date Noted   TIA (transient ischemic attack) 02/03/2020   Aphasia    Acute bronchitis 10/06/2016    Lenda Kelp, PT 04/27/2021, 4:39 PM  [place] MAIN [facility] SERVICES [address] [place], [place], [address] Phone: [phone]   Fax:  [phone]  Name: Kara Pennington MRN: [id number] Date of Birth: Feb 02, 1941

## 2021-05-02 ENCOUNTER — Ambulatory Visit: Payer: Medicare Other

## 2021-05-02 ENCOUNTER — Other Ambulatory Visit: Payer: Self-pay

## 2021-05-02 DIAGNOSIS — R269 Unspecified abnormalities of gait and mobility: Secondary | ICD-10-CM | POA: Diagnosis not present

## 2021-05-02 DIAGNOSIS — R262 Difficulty in walking, not elsewhere classified: Secondary | ICD-10-CM

## 2021-05-02 DIAGNOSIS — R2681 Unsteadiness on feet: Secondary | ICD-10-CM

## 2021-05-02 DIAGNOSIS — M6281 Muscle weakness (generalized): Secondary | ICD-10-CM

## 2021-05-02 NOTE — Therapy (Signed)
[facility] MAIN [facility] SERVICES [address] [place], [place], [address] Phone: [phone]   Fax:  [phone]  Physical Therapy Treatment  Patient Details  Name: Kara Pennington MRN: [id number] Date of Birth: 11/12/1940 Referring Provider (PT): Dr. Lanny Cramp   Encounter Date: 05/02/2021   PT End of Session - 05/02/21 1100     Visit Number 41    Number of Visits 56    Date for PT Re-Evaluation 05/10/21    Authorization Type Medicare Primary; [employer] Secondary    Authorization Time Period eval: 02/08; PN and Recert on 11/10/2020- 02/02/2021; PN on 01/25/2021, Recert 02/15/2021-05/10/2021    Progress Note Due on Visit 40    PT Start Time 1100    PT Stop Time 1144    PT Time Calculation (min) 44 min    Equipment Utilized During Treatment Gait belt    Activity Tolerance Patient tolerated treatment well;Patient limited by fatigue    Behavior During Therapy WFL for tasks assessed/performed             Past Medical History:  Diagnosis Date   Anxiety    Depression    GERD (gastroesophageal reflux disease)    Hypertension     Past Surgical History:  Procedure Laterality Date   APPENDECTOMY     PARATHYROIDECTOMY      There were no vitals filed for this visit.      Bp at start of session: 151/86     Treatment:  Interval one:  Ambulate 160 ft with rollator with close CGA; increased trunk flexion with prolonged ambulation.  Seated: GTB alternating LAQ 10x each LE   Interval two:  Ambulate 160 ft with rollator and one seated rest beak; close CGA; increased trunk flexion with prolonged ambulation. Seated: GTB hamstring curl 12x each LE   Interval 3:  Ambulate 8-12 ft without AD; to additional chair , perform seated marches 10x each LE; x8 trials total/lengths total.   Standing 6" step toe taps 30 seconds x 2 trials very fatiguing second trial   Seated:hedgehog lateral step overs 10x each LE   Hamstring isometric 10 x 3  second holds   Vitals monitored throughout session to ensure therapeutic range.   Pt educated throughout session about proper posture and technique with exercises. Improved exercise technique, movement at target joints, use of target muscles after min to mod verbal, visual, tactile cues.   Patient tolerates interval training interventions with excellent motivation despite her shortness of breath. She is able to perform increased duration of ambulation distance without AD. Patient demonstrates excellent progression towards functional mobility goals. She will benefit from further skilled therapy to improve LE strength and to improve endurance/functional capacity to increase quality of life        PT Education - 05/02/21 1059     Education Details exercise technique, body mechanics    Person(s) Educated Patient    Methods Explanation;Demonstration;Tactile cues;Verbal cues    Comprehension Verbalized understanding;Returned demonstration;Verbal cues required;Tactile cues required              PT Short Term Goals - 11/10/20 0609       PT SHORT TERM GOAL #1   Title Patient will be independent in home exercise program to improve strength/mobility for better functional independence with ADLs.    Baseline 09/26/2020- Patient requires cues for Breathing techniques with walking and reminders to use the BORG RPE scale. She is knowledgeable of seated LE strengthening but will continue to  benefit from progression of HEP to more progressive exercises. 11/10/2020- Patient able to verbalize and demo good working knowledge of breathing technqiues, BORG Scale, and seated/standing therex along with walking program with no questions or concerns at this time.    Time 6    Period Weeks    Status Achieved    Target Date 09/27/20               PT Long Term Goals - 04/27/21 0904       PT LONG TERM GOAL #1   Title Patient will increase FOTO score to equal to or greater than to 61 demonstrate  statistically significant improvement in mobility and quality of life.    Baseline 02/08: 45; 11/10/2020= 49; 02/15/2021=59%    Time 12    Period Weeks    Status On-going      PT LONG TERM GOAL #2   Title Patient (> 20 years old) will complete five times sit to stand test in < 15 seconds indicating an increased LE strength and improved balance.    Baseline 02/08: 19s without BUE support. 09/26/2020= 16.32 sec without UE support. 11/10/2020= 17.03 sec without UE support.  01/25/2021- 13.87 sec without UE Support- will keep goal active to ensure patient remains consistent. 02/15/2021= 11.8 sec without UE support    Time 12    Period Weeks    Status Achieved      PT LONG TERM GOAL #3   Title Patient will increase 10 meter walk test to >1.37m/s as to improve gait speed for better community ambulation and to reduce fall risk.    Baseline 02/08:0.37 m/s. 09/26/2020= 0.59 m/s using 4WW. 11/10/2020= 0.75 m/s using 4WW. 01/25/2021= 0.68 m/s using 4WW. 02/15/2021= 0.71 m/s using 4WW; 04/27/2021= 0.91 m/s using 4WW    Time 12    Period Weeks    Status On-going      PT LONG TERM GOAL #4   Title Patient will reduce timed up and go to <11 seconds to reduce fall risk and demonstrate improved transfer/gait ability.    Baseline 02/08: 33.43 with 2WW. 09/26/2020= 22.18 sec with 6CB; 11/10/2020=19.33 with 4WW. 01/25/2021= 16.16 sec using 4WW. 02/15/2021- Will assess next visit; 04/20/2021= 15.11 using 4WW    Time 12    Period Weeks    Status On-going    Target Date 05/10/21      PT LONG TERM GOAL #5   Title Patient will increase ABC scale score >80% to demonstrate better functional mobility and better confidence with ADLs.    Baseline 02/08: 48.25%; 11/10/2020= 53.13. 02/15/2021=56.25 04/27/2021= 70%    Time 12    Period Weeks    Status On-going      PT LONG TERM GOAL #6   Title Patient will demonstrate improved 6 min walk test from only able to complete 100 feet in  prior to stoppage from fatigue to completing  test at 550 feet or more without rest break, reporting some difficulty or less to improve walking tolerance with community ambulation including walking to dinining hall at independent living facility, grocery shopping, going to church,etc.    Baseline 09/26/2020= 100 feet in 1 min using 4WW- stopped due to fatigue. 11/10/2020= 555 feet with 4WW in 4 min 28 sec.  01/25/2021= 250 feet in 2 min 30 sec using 4WW- stopped due to fatigue today. 02/15/2021= 200 feet in 1 min 53 sec using 4WW- Patient reports fatigue as well as active Lupus Flare up. 04/27/2021= 280 feet in  1 min 53 sec using 4WW.    Time 12    Period Weeks    Status On-going                   Plan - 05/02/21 1140     Clinical Impression Statement Patient tolerates interval training interventions with excellent motivation despite her shortness of breath. She is able to perform increased duration of ambulation distance without AD. Patient demonstrates excellent progression towards functional mobility goals. She will benefit from further skilled therapy to improve LE strength and to improve endurance/functional capacity to increase quality of life    Personal Factors and Comorbidities Age;Comorbidity 3+    Comorbidities TIA, aphasia, acute bronchitis    Examination-Activity Limitations Stairs;Squat;Bend;Stand;Transfers;Lift    Examination-Participation Restrictions Church    Stability/Clinical Decision Making Evolving/Moderate complexity    Rehab Potential Fair    PT Frequency 2x / week    PT Duration 12 weeks    PT Treatment/Interventions ADLs/Self Care Home Management;Canalith Repostioning;Moist Heat;Traction;Electrical Stimulation;Gait training;Stair training;Functional mobility training;Therapeutic activities;Therapeutic exercise;Balance training;Neuromuscular re-education;Patient/family education;Passive range of motion;Energy conservation    PT Next Visit Plan progress strengthening and balance exercises, progress cardiovascular  endurance training; progress standing therex, progress volume of distance amb.    PT Home Exercise Plan No updates today, Encouraged walking to dining hall everyday as able.    Consulted and Agree with Plan of Care Patient             Patient will benefit from skilled therapeutic intervention in order to improve the following deficits and impairments:  Abnormal gait, Decreased endurance, Cardiopulmonary status limiting activity, Decreased activity tolerance, Decreased strength, Decreased balance, Decreased mobility, Difficulty walking, Improper body mechanics, Decreased safety awareness  Visit Diagnosis: Abnormality of gait and mobility  Difficulty in walking, not elsewhere classified  Muscle weakness (generalized)  Unsteadiness on feet     Problem List Patient Active Problem List   Diagnosis Date Noted   TIA (transient ischemic attack) 02/03/2020   Aphasia    Acute bronchitis 10/06/2016    Precious Bard, PT, DPT  05/02/2021, 12:26 PM  [facility] MAIN [facility] SERVICES [address] [place], [place], [address] Phone: [phone]   Fax:  [phone]  Name: Kara Pennington MRN: [id number] Date of Birth: 1941-06-02

## 2021-05-04 ENCOUNTER — Ambulatory Visit: Payer: Medicare Other

## 2021-05-04 ENCOUNTER — Other Ambulatory Visit: Payer: Self-pay

## 2021-05-04 DIAGNOSIS — M6281 Muscle weakness (generalized): Secondary | ICD-10-CM

## 2021-05-04 DIAGNOSIS — R269 Unspecified abnormalities of gait and mobility: Secondary | ICD-10-CM

## 2021-05-04 DIAGNOSIS — R262 Difficulty in walking, not elsewhere classified: Secondary | ICD-10-CM

## 2021-05-04 DIAGNOSIS — R2681 Unsteadiness on feet: Secondary | ICD-10-CM

## 2021-05-04 NOTE — Therapy (Signed)
[facility] MAIN [facility] SERVICES [address] [place], [place], [address] Phone: [phone]   Fax:  [phone]  Physical Therapy Treatment  Patient Details  Name: Kara Pennington MRN: [id number] Date of Birth: Feb 04, 1941 Referring Provider (PT): Dr. Lanny Cramp   Encounter Date: 05/04/2021   PT End of Session - 05/04/21 0908     Visit Number 42    Number of Visits 56    Date for PT Re-Evaluation 05/10/21    Authorization Type Medicare Primary; [employer] Secondary    Authorization Time Period Recert 02/15/2021-05/10/2021    Progress Note Due on Visit 50    PT Start Time 0901    PT Stop Time 0941    PT Time Calculation (min) 40 min    Equipment Utilized During Treatment Gait belt    Activity Tolerance Patient tolerated treatment well;Patient limited by fatigue;No increased pain    Behavior During Therapy WFL for tasks assessed/performed             Past Medical History:  Diagnosis Date   Anxiety    Depression    GERD (gastroesophageal reflux disease)    Hypertension     Past Surgical History:  Procedure Laterality Date   APPENDECTOMY     PARATHYROIDECTOMY      There were no vitals filed for this visit.   Subjective Assessment - 05/04/21 0907     Subjective Pt doing well today, no medical or medication updates since lat visit. She reports she felt good after Tuesday's session.    Pertinent History 80 y.o. female presents to skilled physical therapy with a history of COVID-19 infection 1 year ago, hypothyroidism, and fall on 07/15/2020 with resultant nondisplaced right temporal bone fracture with associated minimal left parietal subarachnoid hemorrhage and a resolved right frontotemporal subdural hematoma who presented to [facility] hospital on 07/23/20 with intermitten but worsening word finding difficulties and expressive aphasia. While in the hospital, she had approximately 24 hours of speaking "in gibberish" and unable to speak.  After about 1 day, it was much improved and she was transferred to [facility]. She normally lives in an independent living apartment. During the week, daughter stated that she was overall doing better though she still would mix up words. On the day of admit, the daughter spoke to her at around noon and she said they had a "normal" conversation. She arrived at [facility] to visit with her at 2:15 and immediately felt that her mother was not well. She was back to speaking non-sensically. Daughter requested return to [facility]. Upon arrival she was able to follow commands and answer some questions accurately, however, she was having considerable word finding difficulties and seemed somewhat frustrated. 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    Currently in Pain? No/denies             INTERVENTIONS:   High Intensity Interval Training- Nustep  Nustep: BUE and LE 1 min level 0 30 sec level 3 (level 4 prohibitively resistant)  level 0 30 sec level 3 (all arm/leg fatigue without dyspnea, indicates not a high-intensity interval response)  1 min level 0 30 sec level 2 (improved SPM, increased SOPB, but not "bad")  2 min level 0   High Intensity Interval Training- Walking  112ft AMB loop, elbows on 4WW x60sec; terminal DOE (clockwise); HR 100s 2 minute seated recovery (full recovery of DOE) 181ft AMB loop, elbows on 4WW x71sec; terminal  DOE (counter clockwise); HR 80s 2 minute seated recovery (full recovery of DOE) 162ft AMB loop, hands, then elbows on 4WW x64sec; terminal DOE (clockwise); HR 90s 2 minute seated recovery (full recovery of DOE) 192ft AMB loop, elbows on 4WW x83sec; terminal DOE (counter clockwise);  3 minute seated recovery (full recovery of DOE)  *DOE becomes progressively more limiting each interval and earlier in 153/7mmHg, 75bpm 2 minutes post   Sit to stand - holding onto green theraball and then  performing overhead press x 2reps. Requires minA. Sit to stand - holding onto green theraball and then performing overhead press x 8reps; chair+airex, Requires supervision level assist.       PT Education - 05/04/21 0908     Education Details use of WC going forward    Person(s) Educated Patient    Methods Explanation    Comprehension Verbalized understanding;Returned demonstration              PT Short Term Goals - 11/10/20 0609       PT SHORT TERM GOAL #1   Title Patient will be independent in home exercise program to improve strength/mobility for better functional independence with ADLs.    Baseline 09/26/2020- Patient requires cues for Breathing techniques with walking and reminders to use the BORG RPE scale. She is knowledgeable of seated LE strengthening but will continue to benefit from progression of HEP to more progressive exercises. 11/10/2020- Patient able to verbalize and demo good working knowledge of breathing technqiues, BORG Scale, and seated/standing therex along with walking program with no questions or concerns at this time.    Time 6    Period Weeks    Status Achieved    Target Date 09/27/20               PT Long Term Goals - 04/27/21 0904       PT LONG TERM GOAL #1   Title Patient will increase FOTO score to equal to or greater than to 61 demonstrate statistically significant improvement in mobility and quality of life.    Baseline 02/08: 45; 11/10/2020= 49; 02/15/2021=59%    Time 12    Period Weeks    Status On-going      PT LONG TERM GOAL #2   Title Patient (> 44 years old) will complete five times sit to stand test in < 15 seconds indicating an increased LE strength and improved balance.    Baseline 02/08: 19s without BUE support. 09/26/2020= 16.32 sec without UE support. 11/10/2020= 17.03 sec without UE support.  01/25/2021- 13.87 sec without UE Support- will keep goal active to ensure patient remains consistent. 02/15/2021= 11.8 sec without UE support     Time 12    Period Weeks    Status Achieved      PT LONG TERM GOAL #3   Title Patient will increase 10 meter walk test to >1.48m/s as to improve gait speed for better community ambulation and to reduce fall risk.    Baseline 02/08:0.37 m/s. 09/26/2020= 0.59 m/s using 4WW. 11/10/2020= 0.75 m/s using 4WW. 01/25/2021= 0.68 m/s using 4WW. 02/15/2021= 0.71 m/s using 4WW; 04/27/2021= 0.91 m/s using 4WW    Time 12    Period Weeks    Status On-going      PT LONG TERM GOAL #4   Title Patient will reduce timed up and go to <11 seconds to reduce fall risk and demonstrate improved transfer/gait ability.    Baseline 02/08: 33.43 with 2WW. 09/26/2020= 22.18 sec with 2DJ;  11/10/2020=19.33 with 1OX. 01/25/2021= 16.16 sec using 4WW. 02/15/2021- Will assess next visit; 04/20/2021= 15.11 using 4WW    Time 12    Period Weeks    Status On-going    Target Date 05/10/21      PT LONG TERM GOAL #5   Title Patient will increase ABC scale score >80% to demonstrate better functional mobility and better confidence with ADLs.    Baseline 02/08: 48.25%; 11/10/2020= 53.13. 02/15/2021=56.25 04/27/2021= 70%    Time 12    Period Weeks    Status On-going      PT LONG TERM GOAL #6   Title Patient will demonstrate improved 6 min walk test from only able to complete 100 feet in  prior to stoppage from fatigue to completing test at 550 feet or more without rest break, reporting some difficulty or less to improve walking tolerance with community ambulation including walking to dinining hall at independent living facility, grocery shopping, going to church,etc.    Baseline 09/26/2020= 100 feet in 1 min using 4WW- stopped due to fatigue. 11/10/2020= 555 feet with 4WW in 4 min 28 sec.  01/25/2021= 250 feet in 2 min 30 sec using 4WW- stopped due to fatigue today. 02/15/2021= 200 feet in 1 min 53 sec using 4WW- Patient reports fatigue as well as active Lupus Flare up. 04/27/2021= 280 feet in 1 min 53 sec using 4WW.    Time 12    Period Weeks     Status On-going                   Plan - 05/04/21 0909     Clinical Impression Statement Continued with current plan of care as laid out in evaluation and recent prior sessions. All interventions tolerated as expected by author. Mobility and strength continue to progress as anticipated, albeit difficulty obtaining desired response to HIIT. Recovery intervals given as needed based on signs of exertion and/or pt request. Pt educated on best technique for each intervention- author uses verbal, visual, tactile cues to optimize learning. Author takes steps to maximize patient independence when appropriate. Pt remains highly motivated. DOE remains biggest limitations, but not associated with any hypoxia.   The patient's therapy prognosis indicates continued potential for improvement, anticipate that future progress is attainable in a reasonable/predictable timeframe. Maximum improvement is within reach. Pt will continue to benefit from skilled PT services to address deficits and impairment identified in evaluation in order to maximize independence and safety in basic mobility required for performance of ADL, IADL, and leisure.     Personal Factors and Comorbidities Age;Comorbidity 3+    Comorbidities TIA, aphasia, acute bronchitis    Examination-Activity Limitations Stairs;Squat;Bend;Stand;Transfers;Lift    Examination-Participation Restrictions Church    Stability/Clinical Decision Making Evolving/Moderate complexity    Clinical Decision Making Moderate    Rehab Potential Fair    PT Frequency 2x / week    PT Duration 12 weeks    PT Treatment/Interventions ADLs/Self Care Home Management;Canalith Repostioning;Moist Heat;Traction;Electrical Stimulation;Gait training;Stair training;Functional mobility training;Therapeutic activities;Therapeutic exercise;Balance training;Neuromuscular re-education;Patient/family education;Passive range of motion;Energy conservation    PT Next Visit Plan progress  strengthening and balance exercises, progress cardiovascular endurance training; progress standing therex, progress volume of distance amb.    PT Home Exercise Plan No updates today, Encouraged walking to dining hall everyday as able.    Consulted and Agree with Plan of Care Patient    Family Member Consulted daughter             Patient  will benefit from skilled therapeutic intervention in order to improve the following deficits and impairments:  Abnormal gait, Decreased endurance, Cardiopulmonary status limiting activity, Decreased activity tolerance, Decreased strength, Decreased balance, Decreased mobility, Difficulty walking, Improper body mechanics, Decreased safety awareness  Visit Diagnosis: Abnormality of gait and mobility  Difficulty in walking, not elsewhere classified  Muscle weakness (generalized)  Unsteadiness on feet     Problem List Patient Active Problem List   Diagnosis Date Noted   TIA (transient ischemic attack) 02/03/2020   Aphasia    Acute bronchitis 10/06/2016   9:40 AM, 05/04/21 Rosamaria Lints, PT, DPT Physical Therapist - [facility]  Outpatient Physical Therapy- Main Campus [phone]     Livingston Wheeler, PT 05/04/2021, 9:14 AM  [place] [facility] MAIN [facility] SERVICES [address] [place], [place], [address] Phone: [phone]   Fax:  [phone]  Name: Kara Pennington MRN: [id number] Date of Birth: 08/27/1940

## 2021-05-09 ENCOUNTER — Ambulatory Visit: Payer: Medicare Other | Attending: Physical Medicine and Rehabilitation

## 2021-05-09 ENCOUNTER — Other Ambulatory Visit: Payer: Self-pay

## 2021-05-09 DIAGNOSIS — M6281 Muscle weakness (generalized): Secondary | ICD-10-CM | POA: Insufficient documentation

## 2021-05-09 DIAGNOSIS — R269 Unspecified abnormalities of gait and mobility: Secondary | ICD-10-CM | POA: Diagnosis present

## 2021-05-09 DIAGNOSIS — R2681 Unsteadiness on feet: Secondary | ICD-10-CM | POA: Diagnosis present

## 2021-05-09 DIAGNOSIS — R262 Difficulty in walking, not elsewhere classified: Secondary | ICD-10-CM | POA: Diagnosis present

## 2021-05-09 NOTE — Therapy (Signed)
[facility] MAIN [facility] SERVICES [address] [place], [place], [address] Phone: [phone]   Fax:  [phone]  Physical Therapy Treatment  Patient Details  Name: Kara Pennington MRN: [id number] Date of Birth: 1941/02/01 Referring Provider (PT): Dr. Lanny Cramp   Encounter Date: 05/09/2021   PT End of Session - 05/09/21 1108     Visit Number 43    Number of Visits 56    Date for PT Re-Evaluation 05/10/21    Authorization Type Medicare Primary; [employer] Secondary    Authorization Time Period Recert 02/15/2021-05/10/2021    Progress Note Due on Visit 50    PT Start Time 1100    PT Stop Time 1145    PT Time Calculation (min) 45 min    Equipment Utilized During Treatment Gait belt    Activity Tolerance Patient tolerated treatment well;Patient limited by fatigue;No increased pain    Behavior During Therapy WFL for tasks assessed/performed             Past Medical History:  Diagnosis Date   Anxiety    Depression    GERD (gastroesophageal reflux disease)    Hypertension     Past Surgical History:  Procedure Laterality Date   APPENDECTOMY     PARATHYROIDECTOMY      There were no vitals filed for this visit.   Subjective Assessment - 05/09/21 1106     Subjective Patient reports continues to be more mobile but reports her fatigue levels are about the same.    Pertinent History 80 y.o. female presents to skilled physical therapy with a history of COVID-19 infection 1 year ago, hypothyroidism, and fall on 07/15/2020 with resultant nondisplaced right temporal bone fracture with associated minimal left parietal subarachnoid hemorrhage and a resolved right frontotemporal subdural hematoma who presented to [facility] hospital on 07/23/20 with intermitten but worsening word finding difficulties and expressive aphasia. While in the hospital, she had approximately 24 hours of speaking "in gibberish" and unable to speak. After about 1 day, it was  much improved and she was transferred to [facility]. She normally lives in an independent living apartment. During the week, daughter stated that she was overall doing better though she still would mix up words. On the day of admit, the daughter spoke to her at around noon and she said they had a "normal" conversation. She arrived at [facility] to visit with her at 2:15 and immediately felt that her mother was not well. She was back to speaking non-sensically. Daughter requested return to [facility]. Upon arrival she was able to follow commands and answer some questions accurately, however, she was having considerable word finding difficulties and seemed somewhat frustrated. 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    Currently in Pain? No/denies                    Circuit style Therapeutic Exercises:   Seated hip march with 2 sec hold x 12 reps BLE Standing step up onto 1st step x 5 reps BLE (stopped due to SOB)  Seated Scap row with Blue TB x 12 reps O2 sat= 97% HR= 828bpm; RPE= 4-6/10  Seated LAQ with 2 sec hold BLE x 12 reps Standing Hip march/abd up and over orange hurdle x 8 reps (Stopped due to fatigue and reported RPE= 7/10) O2 sat 97% and HR=99 bpm Seated BUE shoulder ext with BTB x 12 reps  Standing minisquats x 12  reps Standing calf raises BLE x 12 reps Seated B shoulder horizontal ABD x 12 reps  RPE= 7/10; HR= 102 bpm; O2 sat =96%  Combo of Sit to stand (patient holding onto 3lb dumbells so using LE only to stand) followed by shoulder press x 8 reps  RPE= 7-8/10    Interval training at Nustep  using BUE/LE  79min= level 0 30 sec = level 4 1 min = level 0 30 sec = level 4 1 min = level 0 30 sec = level 4 1 min 30 sec = level 0   HR= 100 bpm O2 sat= 92% (VC for pursed lip breathing)- back up to 97% RPE= 7-8/10 upon completion- Patient visibly SOB and fatigued.  Total distance =0.2 mi  Education  provided throughout session via VC/TC and demonstration to facilitate movement at target joints and correct muscle activation for all testing and exercises performed.    Clinical Impression: Patient motivated for today's session and performed well with instruction in interval training to focus on improving her strength and stamina with activities. She was able to perform some exercises with resistance and modify as needed- Decrease reps/increase rest breaks to complete workout. She continues to exhibit undo fatigue but able to continue better today with encouragement. She will benefit from further skilled therapy to improve LE strength and to improve endurance/functional capacity to increase quality of life             PT Education - 05/10/21 0825     Education Details purpose of interval training    Person(s) Educated Patient    Methods Explanation;Demonstration;Tactile cues;Verbal cues    Comprehension Verbalized understanding;Returned demonstration;Verbal cues required;Need further instruction;Tactile cues required              PT Short Term Goals - 11/10/20 0609       PT SHORT TERM GOAL #1   Title Patient will be independent in home exercise program to improve strength/mobility for better functional independence with ADLs.    Baseline 09/26/2020- Patient requires cues for Breathing techniques with walking and reminders to use the BORG RPE scale. She is knowledgeable of seated LE strengthening but will continue to benefit from progression of HEP to more progressive exercises. 11/10/2020- Patient able to verbalize and demo good working knowledge of breathing technqiues, BORG Scale, and seated/standing therex along with walking program with no questions or concerns at this time.    Time 6    Period Weeks    Status Achieved    Target Date 09/27/20               PT Long Term Goals - 04/27/21 0904       PT LONG TERM GOAL #1   Title Patient will increase FOTO score to  equal to or greater than to 61 demonstrate statistically significant improvement in mobility and quality of life.    Baseline 02/08: 45; 11/10/2020= 49; 02/15/2021=59%    Time 12    Period Weeks    Status On-going      PT LONG TERM GOAL #2   Title Patient (> 52 years old) will complete five times sit to stand test in < 15 seconds indicating an increased LE strength and improved balance.    Baseline 02/08: 19s without BUE support. 09/26/2020= 16.32 sec without UE support. 11/10/2020= 17.03 sec without UE support.  01/25/2021- 13.87 sec without UE Support- will keep goal active to ensure patient remains consistent. 02/15/2021= 11.8 sec without UE support  Time 12    Period Weeks    Status Achieved      PT LONG TERM GOAL #3   Title Patient will increase 10 meter walk test to >1.40m/s as to improve gait speed for better community ambulation and to reduce fall risk.    Baseline 02/08:0.37 m/s. 09/26/2020= 0.59 m/s using 4WW. 11/10/2020= 0.75 m/s using 4WW. 01/25/2021= 0.68 m/s using 4WW. 02/15/2021= 0.71 m/s using 4WW; 04/27/2021= 0.91 m/s using 4WW    Time 12    Period Weeks    Status On-going      PT LONG TERM GOAL #4   Title Patient will reduce timed up and go to <11 seconds to reduce fall risk and demonstrate improved transfer/gait ability.    Baseline 02/08: 33.43 with 2WW. 09/26/2020= 22.18 sec with 8EU; 11/10/2020=19.33 with 4WW. 01/25/2021= 16.16 sec using 4WW. 02/15/2021- Will assess next visit; 04/20/2021= 15.11 using 4WW    Time 12    Period Weeks    Status On-going    Target Date 05/10/21      PT LONG TERM GOAL #5   Title Patient will increase ABC scale score >80% to demonstrate better functional mobility and better confidence with ADLs.    Baseline 02/08: 48.25%; 11/10/2020= 53.13. 02/15/2021=56.25 04/27/2021= 70%    Time 12    Period Weeks    Status On-going      PT LONG TERM GOAL #6   Title Patient will demonstrate improved 6 min walk test from only able to complete 100 feet in  prior  to stoppage from fatigue to completing test at 550 feet or more without rest break, reporting some difficulty or less to improve walking tolerance with community ambulation including walking to dinining hall at independent living facility, grocery shopping, going to church,etc.    Baseline 09/26/2020= 100 feet in 1 min using 4WW- stopped due to fatigue. 11/10/2020= 555 feet with 4WW in 4 min 28 sec.  01/25/2021= 250 feet in 2 min 30 sec using 4WW- stopped due to fatigue today. 02/15/2021= 200 feet in 1 min 53 sec using 4WW- Patient reports fatigue as well as active Lupus Flare up. 04/27/2021= 280 feet in 1 min 53 sec using 4WW.    Time 12    Period Weeks    Status On-going                   Plan - 05/09/21 1108     Clinical Impression Statement Patient motivated for today's session and performed well with instruction in interval training to focus on improving her strength and stamina with activities. She was able to perform some exercises with resistance and modify as needed- Decrease reps/increase rest breaks to complete workout. She continues to exhibit undo fatigue but able to continue better today with encouragement. She will benefit from further skilled therapy to improve LE strength and to improve endurance/functional capacity to increase quality of life    Personal Factors and Comorbidities Age;Comorbidity 3+    Comorbidities TIA, aphasia, acute bronchitis    Examination-Activity Limitations Stairs;Squat;Bend;Stand;Transfers;Lift    Examination-Participation Restrictions Church    Stability/Clinical Decision Making Evolving/Moderate complexity    Rehab Potential Fair    PT Frequency 2x / week    PT Duration 12 weeks    PT Treatment/Interventions ADLs/Self Care Home Management;Canalith Repostioning;Moist Heat;Traction;Electrical Stimulation;Gait training;Stair training;Functional mobility training;Therapeutic activities;Therapeutic exercise;Balance training;Neuromuscular  re-education;Patient/family education;Passive range of motion;Energy conservation    PT Next Visit Plan progress strengthening and balance exercises, progress cardiovascular endurance  training; progress standing therex, progress volume of distance amb.    PT Home Exercise Plan No updates today, Encouraged walking to dining hall everyday as able.    Consulted and Agree with Plan of Care Patient    Family Member Consulted daughter             Patient will benefit from skilled therapeutic intervention in order to improve the following deficits and impairments:  Abnormal gait, Decreased endurance, Cardiopulmonary status limiting activity, Decreased activity tolerance, Decreased strength, Decreased balance, Decreased mobility, Difficulty walking, Improper body mechanics, Decreased safety awareness  Visit Diagnosis: Abnormality of gait and mobility  Difficulty in walking, not elsewhere classified  Muscle weakness (generalized)  Unsteadiness on feet     Problem List Patient Active Problem List   Diagnosis Date Noted   TIA (transient ischemic attack) 02/03/2020   Aphasia    Acute bronchitis 10/06/2016    Kara Pennington, PT 05/10/2021, 8:30 AM  [facility] MAIN [facility] SERVICES [address] [place], [place], [address] Phone: [phone]   Fax:  [phone]  Name: Kara Pennington MRN: [id number] Date of Birth: 1941/03/11

## 2021-05-11 ENCOUNTER — Ambulatory Visit: Payer: Medicare Other

## 2021-05-11 ENCOUNTER — Other Ambulatory Visit: Payer: Self-pay

## 2021-05-11 DIAGNOSIS — R269 Unspecified abnormalities of gait and mobility: Secondary | ICD-10-CM

## 2021-05-11 DIAGNOSIS — M6281 Muscle weakness (generalized): Secondary | ICD-10-CM

## 2021-05-11 DIAGNOSIS — R262 Difficulty in walking, not elsewhere classified: Secondary | ICD-10-CM

## 2021-05-11 DIAGNOSIS — R2681 Unsteadiness on feet: Secondary | ICD-10-CM

## 2021-05-11 NOTE — Therapy (Signed)
[facility] MAIN [facility] SERVICES [address] [place], [place], [address] Phone: [phone]   Fax:  [phone]  Physical Therapy Treatment/Recertification for dates 05/11/2021-08/03/2021  Patient Details  Name: Kara Pennington MRN: [id number] Date of Birth: [birth year] Referring Provider (PT): Dr. Lanny Cramp   Encounter Date: 05/11/2021   PT End of Session - 05/11/21 0858     Visit Number 44    Number of Visits 56    Date for PT Re-Evaluation 08/03/21    Authorization Type Medicare Primary; Aet[employer] Secondary    Authorization Time Period Recert 02/15/2021-05/10/2021; 05/11/2021- 08/03/2021    Progress Note Due on Visit 50    PT Start Time 0903    PT Stop Time 0946    PT Time Calculation (min) 43 min    Equipment Utilized During Treatment Gait belt    Activity Tolerance Patient tolerated treatment well;Patient limited by fatigue;No increased pain    Behavior During Therapy WFL for tasks assessed/performed             Past Medical History:  Diagnosis Date   Anxiety    Depression    GERD (gastroesophageal reflux disease)    Hypertension     Past Surgical History:  Procedure Laterality Date   APPENDECTOMY     PARATHYROIDECTOMY      There were no vitals filed for this visit.   Subjective Assessment - 05/11/21 0858     Subjective Patient reports doing okay today- no report of pain or falls. States she has been walking some in the home without an AD.    Pertinent History 80 y.o. female presents to skilled physical therapy with a history of COVID-19 infection 1 year ago, hypothyroidism, and fall on 07/15/2020 with resultant nondisplaced right temporal bone fracture with associated minimal left parietal subarachnoid hemorrhage and a resolved right frontotemporal subdural hematoma who presented to [facility] on 07/23/20 with intermitten but worsening word finding difficulties and expressive aphasia. While in the hospital, she had  approximately 24 hours of speaking "in gibberish" and unable to speak. After about 1 day, it was much improved and she was transferred to [facility]. She normally lives in an independent living apartment. During the week, daughter stated that she was overall doing better though she still would mix up words. On the day of admit, the daughter spoke to her at around noon and she said they had a "normal" conversation. She arrived at [facility] to visit with her at 2:15 and immediately felt that her mother was not well. She was back to speaking non-sensically. Daughter requested return to [facility]. Upon arrival she was able to follow commands and answer some questions accurately, however, she was having considerable word finding difficulties and seemed somewhat frustrated. 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    Currently in Pain? No/denies                INTERVENTIONS:   Reassessed all Long term goals today:   1) FOTO- Declined from 59% down to 51% (Overall improved from 45)   2) 10 meter walk test= 0.80 m/s (last measured at 0.91 m/s on 04/27/2021 - but overall progressing from initial 0.37 m/s using 4WW  3) TUG= 15.1 avg today - still shy of 11 sec goal and unchanged from last measured on 10/13. She has progressed well overall from initial 33.43 sec   4) ABC scale= 72.50% (improved from last tested at 70%)  and from initial score of 48.25%    Nustep Interval training.   Interval training at Nustep  using BUE/LE   36min= level 0 30 sec = level 5 1 min = level 0 30 sec = level 5 1 min = level 0 (RPE= 2-3/10) 30 sec = level 5 (RPE= 7-8/10) 1 min  = level 0  30 sec= Level 5 54min= Level 0  30 sec = level 5 30 sec = level 0  Patient was able to carry on conversation and denied significant SOB or fatigue. Total distance =0.26 mi     Clinical Impression: Patient was reassessed today for recertification visit and  presents with some functional improvement including the most limiting goal of endurance. She was able to demonstrate improvement with 6 min walk test which has been the most limited issue with Therapy. She maintained her TUG indicating low risk of falling and denies any falls. She had slight decrease score in gait speed however may be contributed to level of fatigue after performing 6 min walk test. She did also have a decline in her FOTO score indicating limitations with self perceived abilities. Patient appropriate for recert today and agreeable to plan. Her condition has the potential to improve in response to therapy. Maximum improvement is yet to be obtained. The anticipated improvement is attainable and reasonable in a generally predictable time.                PT Education - 05/11/21 0959     Education Details Plan for PT recertification    Person(s) Educated Patient    Methods Explanation    Comprehension Verbalized understanding              PT Short Term Goals - 11/10/20 0609       PT SHORT TERM GOAL #1   Title Patient will be independent in home exercise program to improve strength/mobility for better functional independence with ADLs.    Baseline 09/26/2020- Patient requires cues for Breathing techniques with walking and reminders to use the BORG RPE scale. She is knowledgeable of seated LE strengthening but will continue to benefit from progression of HEP to more progressive exercises. 11/10/2020- Patient able to verbalize and demo good working knowledge of breathing technqiues, BORG Scale, and seated/standing therex along with walking program with no questions or concerns at this time.    Time 6    Period Weeks    Status Achieved    Target Date 09/27/20               PT Long Term Goals - 05/11/21 0918       PT LONG TERM GOAL #1   Title Patient will increase FOTO score to equal to or greater than to 61 demonstrate statistically significant improvement in  mobility and quality of life.    Baseline 02/08: 45; 11/10/2020= 49; 02/15/2021=59%; 05/11/2021= 51%    Time 12    Period Weeks    Status On-going    Target Date 08/03/21      PT LONG TERM GOAL #2   Title Patient (> 49 years old) will complete five times sit to stand test in < 15 seconds indicating an increased LE strength and improved balance.    Baseline 02/08: 19s without BUE support. 09/26/2020= 16.32 sec without UE support. 11/10/2020= 17.03 sec without UE support.  01/25/2021- 13.87 sec without UE Support- will keep goal active to ensure patient remains consistent. 02/15/2021= 11.8 sec without UE support  Time 12    Period Weeks    Status Achieved      PT LONG TERM GOAL #3   Title Patient will increase 10 meter walk test to >1.59m/s as to improve gait speed for better community ambulation and to reduce fall risk.    Baseline 02/08:0.37 m/s. 09/26/2020= 0.59 m/s using 4WW. 11/10/2020= 0.75 m/s using 4WW. 01/25/2021= 0.68 m/s using 4WW. 02/15/2021= 0.71 m/s using 4WW; 04/27/2021= 0.91 m/s using 4WW; 05/11/2021= 0.8 m/s    Time 12    Period Weeks    Status On-going    Target Date 08/03/21      PT LONG TERM GOAL #4   Title Patient will reduce timed up and go to <11 seconds to reduce fall risk and demonstrate improved transfer/gait ability.    Baseline 02/08: 33.43 with 2WW. 09/26/2020= 22.18 sec with 8EX; 11/10/2020=19.33 with 4WW. 01/25/2021= 16.16 sec using 4WW. 02/15/2021- Will assess next visit; 04/20/2021= 15.11 using 4WW; 05/11/2021= TUG avg of 2 tests using 4WW= 15.1 sec.    Time 12    Period Weeks    Status On-going    Target Date 08/03/21      PT LONG TERM GOAL #5   Title Patient will increase ABC scale score >80% to demonstrate better functional mobility and better confidence with ADLs.    Baseline 02/08: 48.25%; 11/10/2020= 53.13. 02/15/2021=56.25 04/27/2021= 70%; 05/11/2021= 72.50%    Time 12    Period Weeks    Status On-going    Target Date 08/03/21      PT LONG TERM GOAL #6   Title  Patient will demonstrate improved 6 min walk test from only able to complete 100 feet in  prior to stoppage from fatigue to completing test at 550 feet or more without rest break, reporting some difficulty or less to improve walking tolerance with community ambulation including walking to dinining hall at independent living facility, grocery shopping, going to church,etc.    Baseline 09/26/2020= 100 feet in 1 min using 4WW- stopped due to fatigue. 11/10/2020= 555 feet with 4WW in 4 min 28 sec.  01/25/2021= 250 feet in 2 min 30 sec using 4WW- stopped due to fatigue today. 02/15/2021= 200 feet in 1 min 53 sec using 4WW- Patient reports fatigue as well as active Lupus Flare up. 04/27/2021= 280 feet in 1 min 53 sec using 4WW.  05/11/2021= 300 feet in 2 min 38 sec using 4WW    Time 12    Period Weeks    Status On-going    Target Date 08/03/21                   Plan - 05/11/21 0900     Clinical Impression Statement Patient was reassessed today for recertification visit and presents with some functional improvement including the most limiting goal of endurance. She was able to demonstrate improvement with 6 min walk test which has been the most limited issue with Therapy. She maintained her TUG indicating low risk of falling and denies any falls. She had slight decrease score in gait speed however may be contributed to level of fatigue after performing 6 min walk test. She did also have a decline in her FOTO score indicating limitations with self perceived abilities. Patient appropriate for recert today and agreeable to plan. ?Her condition has the potential to improve in response to therapy. Maximum improvement is yet to be obtained. The anticipated improvement is attainable and reasonable in a generally predictable time.  Personal Factors and Comorbidities Age;Comorbidity 3+    Comorbidities TIA, aphasia, acute bronchitis    Examination-Activity Limitations  Stairs;Squat;Bend;Stand;Transfers;Lift;Caring for Others;Carry    Examination-Participation Restrictions Cleaning;Community Activity;Driving;Shop;Yard Work    Stability/Clinical Decision Making Evolving/Moderate complexity    Rehab Potential Fair    PT Frequency 2x / week    PT Duration 12 weeks    PT Treatment/Interventions ADLs/Self Care Home Management;Canalith Repostioning;Moist Heat;Traction;Electrical Stimulation;Gait training;Stair training;Functional mobility training;Therapeutic activities;Therapeutic exercise;Balance training;Neuromuscular re-education;Patient/family education;Passive range of motion;Energy conservation;Cryotherapy;DME Instruction;Manual techniques;Dry needling;Joint Manipulations    PT Next Visit Plan progress strengthening and balance exercises, progress cardiovascular endurance training; progress standing therex, progress volume of distance amb.    PT Home Exercise Plan No updates today, Encouraged walking to dining hall everyday as able.    Consulted and Agree with Plan of Care Patient    Family Member Consulted daughter             Patient will benefit from skilled therapeutic intervention in order to improve the following deficits and impairments:  Abnormal gait, Decreased endurance, Cardiopulmonary status limiting activity, Decreased activity tolerance, Decreased strength, Decreased balance, Decreased mobility, Difficulty walking, Improper body mechanics, Decreased safety awareness, Postural dysfunction  Visit Diagnosis: Abnormality of gait and mobility  Difficulty in walking, not elsewhere classified  Muscle weakness (generalized)  Unsteadiness on feet     Problem List Patient Active Problem List   Diagnosis Date Noted   TIA (transient ischemic attack) 02/03/2020   Aphasia    Acute bronchitis 10/06/2016    Lenda Kelp, PT 05/11/2021, 1:14 PM  [facility] MAIN [facility] SERVICES [address]  [place], [place], [address] Phone: [phone]   Fax:  [phone]  Name: Kara Pennington MRN: [id number] Date of Birth: 11-Jul-1940

## 2021-05-16 ENCOUNTER — Other Ambulatory Visit: Payer: Self-pay

## 2021-05-16 ENCOUNTER — Ambulatory Visit: Payer: Medicare Other

## 2021-05-16 DIAGNOSIS — M6281 Muscle weakness (generalized): Secondary | ICD-10-CM

## 2021-05-16 DIAGNOSIS — R269 Unspecified abnormalities of gait and mobility: Secondary | ICD-10-CM | POA: Diagnosis not present

## 2021-05-16 DIAGNOSIS — R2681 Unsteadiness on feet: Secondary | ICD-10-CM

## 2021-05-16 DIAGNOSIS — R262 Difficulty in walking, not elsewhere classified: Secondary | ICD-10-CM

## 2021-05-16 NOTE — Therapy (Signed)
[facility] MAIN [facility] SERVICES [address] [place], [place], [address] Phone: [phone]   Fax:  [phone]  Physical Therapy Treatment  Patient Details  Name: Kara Pennington MRN: [id number] Date of Birth: Oct 19, 1940 Referring Provider (PT): Dr. Lanny Cramp   Encounter Date: 05/16/2021   PT End of Session - 05/16/21 1103     Visit Number 45    Number of Visits 56    Date for PT Re-Evaluation 08/03/21    Authorization Type Medicare Primary; [employer] Secondary    Authorization Time Period Recert 02/15/2021-05/10/2021; 05/11/2021- 08/03/2021    Progress Note Due on Visit 50    PT Start Time 1059    PT Stop Time 1144    PT Time Calculation (min) 45 min    Equipment Utilized During Treatment Gait belt    Activity Tolerance Patient tolerated treatment well;Patient limited by fatigue;No increased pain    Behavior During Therapy WFL for tasks assessed/performed             Past Medical History:  Diagnosis Date   Anxiety    Depression    GERD (gastroesophageal reflux disease)    Hypertension     Past Surgical History:  Procedure Laterality Date   APPENDECTOMY     PARATHYROIDECTOMY      There were no vitals filed for this visit.   Subjective Assessment - 05/16/21 1058     Subjective Patient reports no pain and no falls - "just cant shake that tired feeling."    Pertinent History 80 y.o. female presents to skilled physical therapy with a history of COVID-19 infection 1 year ago, hypothyroidism, and fall on 07/15/2020 with resultant nondisplaced right temporal bone fracture with associated minimal left parietal subarachnoid hemorrhage and a resolved right frontotemporal subdural hematoma who presented to [facility] hospital on 07/23/20 with intermitten but worsening word finding difficulties and expressive aphasia. While in the hospital, she had approximately 24 hours of speaking "in gibberish" and unable to speak. After about 1 day, it  was much improved and she was transferred to [facility]. She normally lives in an independent living apartment. During the week, daughter stated that she was overall doing better though she still would mix up words. On the day of admit, the daughter spoke to her at around noon and she said they had a "normal" conversation. She arrived at [facility] to visit with her at 2:15 and immediately felt that her mother was not well. She was back to speaking non-sensically. Daughter requested return to [facility]. Upon arrival she was able to follow commands and answer some questions accurately, however, she was having considerable word finding difficulties and seemed somewhat frustrated. 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    Currently in Pain? No/denies                INTERVENTIONS:     Interval Training Nustep  Level 0 for 3 min L5 for 30 sec  L0 for L5 for 30 sec L1 for 1 min  L5 for 30  sec  L1 for L5 for 30 sec  L4 for 25  sec L3 for 25 sec L2 for 40 sec  L1 for 15 sec L0 for 15 sec Total distance = 0.3 mi  Step tap onto 12" step block (with UE support)   x 10 BLE (Patient rated - That's the hardest exercise you have made me do yet!)  Step tap onto blue airex pad (without UE support) x 10 BLE- "That was just as hard since I couldn't use my arms and had to stand up straight"   Dynamic marching while standing inside of // bars on blue airex pad x 25 steps each without UE support.   Side stepping over 1/2 foam x 2 and green therapad using BUE support yet VC's to stand upright and use mirror to watch her feet versus bending over to look down x 2 trials down and back then brief rest break due to fatigue then 2 more trials.   Forward steps over 1/2 foam x 2 and green therapad using BUE support yet VC's to stand upright x 2 trials down and back- Patient demo difficulty with step height and foot clipped foam and  pad several times - VC to increase step height. Patient completed another 2 trials after cueing with improved results.   HR= 88bpm after balance exercises  and O2 sats = 97% Pursed lip breathing x 1 min then rechecked HR=   Education provided throughout session via VC/TC and demonstration to facilitate movement at target joints and correct muscle activation for all testing and exercises performed.   Clinical Impression: Patient presents today with good motivation for all tasks. She performed well with interval training with fatigue continuing to be the most limiting factor. She required VC to remind her to take a larger step over objects to clear and patient was surprised at her difficulty maintaining her balance without UE support in // bars today. She will benefit from further skilled therapy to improve LE strength and to improve endurance/functional capacity to increase quality of life                 PT Education - 05/16/21 1102     Education Details Exercise techniques    Person(s) Educated Patient    Methods Explanation;Demonstration;Tactile cues;Verbal cues    Comprehension Verbalized understanding;Verbal cues required;Need further instruction;Returned demonstration              PT Short Term Goals - 11/10/20 0609       PT SHORT TERM GOAL #1   Title Patient will be independent in home exercise program to improve strength/mobility for better functional independence with ADLs.    Baseline 09/26/2020- Patient requires cues for Breathing techniques with walking and reminders to use the BORG RPE scale. She is knowledgeable of seated LE strengthening but will continue to benefit from progression of HEP to more progressive exercises. 11/10/2020- Patient able to verbalize and demo good working knowledge of breathing technqiues, BORG Scale, and seated/standing therex along with walking program with no questions or concerns at this time.    Time 6    Period Weeks    Status  Achieved    Target Date 09/27/20               PT Long Term Goals - 05/11/21 0918       PT LONG TERM GOAL #1   Title Patient will increase FOTO score to equal to or greater than to 61 demonstrate statistically significant improvement in mobility and quality of life.    Baseline 02/08: 45; 11/10/2020= 49; 02/15/2021=59%; 05/11/2021= 51%    Time 12    Period Weeks    Status On-going    Target Date 08/03/21      PT LONG TERM GOAL #2   Title Patient (> 58 years old) will complete five times sit to stand test in <  15 seconds indicating an increased LE strength and improved balance.    Baseline 02/08: 19s without BUE support. 09/26/2020= 16.32 sec without UE support. 11/10/2020= 17.03 sec without UE support.  01/25/2021- 13.87 sec without UE Support- will keep goal active to ensure patient remains consistent. 02/15/2021= 11.8 sec without UE support    Time 12    Period Weeks    Status Achieved      PT LONG TERM GOAL #3   Title Patient will increase 10 meter walk test to >1.35m/s as to improve gait speed for better community ambulation and to reduce fall risk.    Baseline 02/08:0.37 m/s. 09/26/2020= 0.59 m/s using 4WW. 11/10/2020= 0.75 m/s using 4WW. 01/25/2021= 0.68 m/s using 4WW. 02/15/2021= 0.71 m/s using 4WW; 04/27/2021= 0.91 m/s using 4WW; 05/11/2021= 0.8 m/s    Time 12    Period Weeks    Status On-going    Target Date 08/03/21      PT LONG TERM GOAL #4   Title Patient will reduce timed up and go to <11 seconds to reduce fall risk and demonstrate improved transfer/gait ability.    Baseline 02/08: 33.43 with 2WW. 09/26/2020= 22.18 sec with 7AJ; 11/10/2020=19.33 with 4WW. 01/25/2021= 16.16 sec using 4WW. 02/15/2021- Will assess next visit; 04/20/2021= 15.11 using 4WW; 05/11/2021= TUG avg of 2 tests using 4WW= 15.1 sec.    Time 12    Period Weeks    Status On-going    Target Date 08/03/21      PT LONG TERM GOAL #5   Title Patient will increase ABC scale score >80% to demonstrate better functional  mobility and better confidence with ADLs.    Baseline 02/08: 48.25%; 11/10/2020= 53.13. 02/15/2021=56.25 04/27/2021= 70%; 05/11/2021= 72.50%    Time 12    Period Weeks    Status On-going    Target Date 08/03/21      PT LONG TERM GOAL #6   Title Patient will demonstrate improved 6 min walk test from only able to complete 100 feet in  prior to stoppage from fatigue to completing test at 550 feet or more without rest break, reporting some difficulty or less to improve walking tolerance with community ambulation including walking to dinining hall at independent living facility, grocery shopping, going to church,etc.    Baseline 09/26/2020= 100 feet in 1 min using 4WW- stopped due to fatigue. 11/10/2020= 555 feet with 4WW in 4 min 28 sec.  01/25/2021= 250 feet in 2 min 30 sec using 4WW- stopped due to fatigue today. 02/15/2021= 200 feet in 1 min 53 sec using 4WW- Patient reports fatigue as well as active Lupus Flare up. 04/27/2021= 280 feet in 1 min 53 sec using 4WW.  05/11/2021= 300 feet in 2 min 38 sec using 4WW    Time 12    Period Weeks    Status On-going    Target Date 08/03/21                   Plan - 05/16/21 1104     Clinical Impression Statement Patient presents today with good motivation for all tasks. She performed well with interval training with fatigue continuing to be the most limiting factor. She required VC to remind her to take a larger step over objects to clear and patient was surprised at her difficulty maintaining her balance without UE support in // bars today. She will benefit from further skilled therapy to improve LE strength and to improve endurance/functional capacity to increase quality of life  Personal Factors and Comorbidities Age;Comorbidity 3+    Comorbidities TIA, aphasia, acute bronchitis    Examination-Activity Limitations Stairs;Squat;Bend;Stand;Transfers;Lift;Caring for Others;Carry    Examination-Participation Restrictions Cleaning;Community  Activity;Driving;Shop;Yard Work    Stability/Clinical Decision Making Evolving/Moderate complexity    Rehab Potential Fair    PT Frequency 2x / week    PT Duration 12 weeks    PT Treatment/Interventions ADLs/Self Care Home Management;Canalith Repostioning;Moist Heat;Traction;Electrical Stimulation;Gait training;Stair training;Functional mobility training;Therapeutic activities;Therapeutic exercise;Balance training;Neuromuscular re-education;Patient/family education;Passive range of motion;Energy conservation;Cryotherapy;DME Instruction;Manual techniques;Dry needling;Joint Manipulations    PT Next Visit Plan progress strengthening and balance exercises, progress cardiovascular endurance training; progress standing therex, progress volume of distance amb.    PT Home Exercise Plan No updates today, Encouraged walking to dining hall everyday as able.    Consulted and Agree with Plan of Care Patient    Family Member Consulted daughter             Patient will benefit from skilled therapeutic intervention in order to improve the following deficits and impairments:  Abnormal gait, Decreased endurance, Cardiopulmonary status limiting activity, Decreased activity tolerance, Decreased strength, Decreased balance, Decreased mobility, Difficulty walking, Improper body mechanics, Decreased safety awareness, Postural dysfunction  Visit Diagnosis: Abnormality of gait and mobility  Difficulty in walking, not elsewhere classified  Muscle weakness (generalized)  Unsteadiness on feet     Problem List Patient Active Problem List   Diagnosis Date Noted   TIA (transient ischemic attack) 02/03/2020   Aphasia    Acute bronchitis 10/06/2016    Lenda Kelp, PT 05/16/2021, 5:16 PM  [facility] MAIN [facility] SERVICES [address] [place], [place], [address] Phone: [phone]   Fax:  [phone]  Name: VIANNY SCHRAEDER MRN: [id number] Date of Birth:  08-31-1940

## 2021-05-18 ENCOUNTER — Ambulatory Visit: Payer: Medicare Other

## 2021-05-23 ENCOUNTER — Other Ambulatory Visit: Payer: Self-pay

## 2021-05-23 ENCOUNTER — Ambulatory Visit: Payer: Medicare Other

## 2021-05-23 DIAGNOSIS — R2681 Unsteadiness on feet: Secondary | ICD-10-CM

## 2021-05-23 DIAGNOSIS — M6281 Muscle weakness (generalized): Secondary | ICD-10-CM

## 2021-05-23 DIAGNOSIS — R269 Unspecified abnormalities of gait and mobility: Secondary | ICD-10-CM

## 2021-05-23 DIAGNOSIS — R262 Difficulty in walking, not elsewhere classified: Secondary | ICD-10-CM

## 2021-05-23 NOTE — Therapy (Signed)
[facility] MAIN [facility] SERVICES [address] [place], [place], [address] Phone: [phone]   Fax:  [phone]  Physical Therapy Treatment  Patient Details  Name: Kara Pennington MRN: [id number] Date of Birth: 03/31/1941 Referring Provider (PT): Dr. Lanny Cramp   Encounter Date: 05/23/2021   PT End of Session - 05/23/21 0709     Visit Number 46    Number of Visits 56    Date for PT Re-Evaluation 08/03/21    Authorization Type Medicare Primary; [employer]et[employer] Secondary    Authorization Time Period Recert 02/15/2021-05/10/2021; 05/11/2021- 08/03/2021    Progress Note Due on Visit 50    PT Start Time 1202    PT Stop Time 1233    PT Time Calculation (min) 31 min    Equipment Utilized During Treatment Gait belt    Activity Tolerance Patient tolerated treatment well;Patient limited by fatigue;No increased pain    Behavior During Therapy WFL for tasks assessed/performed             Past Medical History:  Diagnosis Date   Anxiety    Depression    GERD (gastroesophageal reflux disease)    Hypertension     Past Surgical History:  Procedure Laterality Date   APPENDECTOMY     PARATHYROIDECTOMY      There were no vitals filed for this visit.   Subjective Assessment - 05/23/21 1212     Subjective Patient reports running behind due to transportation issues. States she has really been trying to stand up straighter for improved posutre.    Pertinent History 80 y.o. female presents to skilled physical therapy with a history of COVID-19 infection 1 year ago, hypothyroidism, and fall on 07/15/2020 with resultant nondisplaced right temporal bone fracture with associated minimal left parietal subarachnoid hemorrhage and a resolved right frontotemporal subdural hematoma who presented to [facility] hospital on 07/23/20 with intermitten but worsening word finding difficulties and expressive aphasia. While in the hospital, she had approximately 24 hours of  speaking "in gibberish" and unable to speak. After about 1 day, it was much improved and she was transferred to [facility]. She normally lives in an independent living apartment. During the week, daughter stated that she was overall doing better though she still would mix up words. On the day of admit, the daughter spoke to her at around noon and she said they had a "normal" conversation. She arrived at [facility] to visit with her at 2:15 and immediately felt that her mother was not well. She was back to speaking non-sensically. Daughter requested return to [facility]. Upon arrival she was able to follow commands and answer some questions accurately, however, she was having considerable word finding difficulties and seemed somewhat frustrated. 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    Currently in Pain? No/denies            *patient arrived 17 min late today due to transportation issues.  INTERVENTIONS:   Circuit style workout for postural/UE/LE strength and improved functional endurance.   1st rd- scap retract with GTB x 12 reps then 12 reps of B ham curl using GTB and amb of 160 feet using 4WW (Patient rated 4/10 on RPE)  2nd rd- lumbar ext (seated dead lift using 3lb dumbell) x 12 reps; Sit to stand x 10 reps followed by another 160 feet walking. (Patient rated at a 5/10)   3rd rd- Standing erect for 2 min followed by 160  feet of ambulation.   4th round- Horizontal shoulder abd- GTB x 12 BUE followed by 160 feet of ambulation. Patient reported 6/10.  Education provided throughout session via VC/TC and demonstration to facilitate movement at target joints and correct muscle activation for all testing and exercises performed.   Clinical Impression: Treatment was limited secondary to patient late arrival yet she presented very motivated today and able to complete a circuit style workout incorporating some UE/Postural/and LE  strengthening along with some endurance training with walking. She was able to maintain between 4-6/10 on RPE and less overall rest breaks. She will benefit from further skilled therapy to improve LE strength and to improve endurance/functional capacity to increase quality of life                            PT Short Term Goals - 11/10/20 0737       PT SHORT TERM GOAL #1   Title Patient will be independent in home exercise program to improve strength/mobility for better functional independence with ADLs.    Baseline 09/26/2020- Patient requires cues for Breathing techniques with walking and reminders to use the BORG RPE scale. She is knowledgeable of seated LE strengthening but will continue to benefit from progression of HEP to more progressive exercises. 11/10/2020- Patient able to verbalize and demo good working knowledge of breathing technqiues, BORG Scale, and seated/standing therex along with walking program with no questions or concerns at this time.    Time 6    Period Weeks    Status Achieved    Target Date 09/27/20               PT Long Term Goals - 05/11/21 0918       PT LONG TERM GOAL #1   Title Patient will increase FOTO score to equal to or greater than to 61 demonstrate statistically significant improvement in mobility and quality of life.    Baseline 02/08: 45; 11/10/2020= 49; 02/15/2021=59%; 05/11/2021= 51%    Time 12    Period Weeks    Status On-going    Target Date 08/03/21      PT LONG TERM GOAL #2   Title Patient (> 16 years old) will complete five times sit to stand test in < 15 seconds indicating an increased LE strength and improved balance.    Baseline 02/08: 19s without BUE support. 09/26/2020= 16.32 sec without UE support. 11/10/2020= 17.03 sec without UE support.  01/25/2021- 13.87 sec without UE Support- will keep goal active to ensure patient remains consistent. 02/15/2021= 11.8 sec without UE support    Time 12    Period Weeks    Status  Achieved      PT LONG TERM GOAL #3   Title Patient will increase 10 meter walk test to >1.60m/s as to improve gait speed for better community ambulation and to reduce fall risk.    Baseline 02/08:0.37 m/s. 09/26/2020= 0.59 m/s using 4WW. 11/10/2020= 0.75 m/s using 4WW. 01/25/2021= 0.68 m/s using 4WW. 02/15/2021= 0.71 m/s using 4WW; 04/27/2021= 0.91 m/s using 4WW; 05/11/2021= 0.8 m/s    Time 12    Period Weeks    Status On-going    Target Date 08/03/21      PT LONG TERM GOAL #4   Title Patient will reduce timed up and go to <11 seconds to reduce fall risk and demonstrate improved transfer/gait ability.    Baseline 02/08: 33.43 with 2WW. 09/26/2020= 22.18 sec  with 0DX; 11/10/2020=19.33 with 4WW. 01/25/2021= 16.16 sec using 4WW. 02/15/2021- Will assess next visit; 04/20/2021= 15.11 using 4WW; 05/11/2021= TUG avg of 2 tests using 4WW= 15.1 sec.    Time 12    Period Weeks    Status On-going    Target Date 08/03/21      PT LONG TERM GOAL #5   Title Patient will increase ABC scale score >80% to demonstrate better functional mobility and better confidence with ADLs.    Baseline 02/08: 48.25%; 11/10/2020= 53.13. 02/15/2021=56.25 04/27/2021= 70%; 05/11/2021= 72.50%    Time 12    Period Weeks    Status On-going    Target Date 08/03/21      PT LONG TERM GOAL #6   Title Patient will demonstrate improved 6 min walk test from only able to complete 100 feet in  prior to stoppage from fatigue to completing test at 550 feet or more without rest break, reporting some difficulty or less to improve walking tolerance with community ambulation including walking to dinining hall at independent living facility, grocery shopping, going to church,etc.    Baseline 09/26/2020= 100 feet in 1 min using 4WW- stopped due to fatigue. 11/10/2020= 555 feet with 4WW in 4 min 28 sec.  01/25/2021= 250 feet in 2 min 30 sec using 4WW- stopped due to fatigue today. 02/15/2021= 200 feet in 1 min 53 sec using 4WW- Patient reports fatigue as well  as active Lupus Flare up. 04/27/2021= 280 feet in 1 min 53 sec using 4WW.  05/11/2021= 300 feet in 2 min 38 sec using 4WW    Time 12    Period Weeks    Status On-going    Target Date 08/03/21                    Patient will benefit from skilled therapeutic intervention in order to improve the following deficits and impairments:     Visit Diagnosis: Abnormality of gait and mobility  Difficulty in walking, not elsewhere classified  Muscle weakness (generalized)  Unsteadiness on feet     Problem List Patient Active Problem List   Diagnosis Date Noted   TIA (transient ischemic attack) 02/03/2020   Aphasia    Acute bronchitis 10/06/2016    Lenda Kelp, PT 05/24/2021, 7:10 AM  [facility] MAIN [facility] SERVICES [address] [place], [place], [address] Phone: [phone]   Fax:  [phone]  Name: Kara Pennington MRN: [id number] Date of Birth: 12/31/1940

## 2021-05-25 ENCOUNTER — Other Ambulatory Visit: Payer: Self-pay

## 2021-05-25 ENCOUNTER — Ambulatory Visit: Payer: Medicare Other

## 2021-05-25 DIAGNOSIS — R269 Unspecified abnormalities of gait and mobility: Secondary | ICD-10-CM

## 2021-05-25 DIAGNOSIS — R262 Difficulty in walking, not elsewhere classified: Secondary | ICD-10-CM

## 2021-05-25 DIAGNOSIS — R2681 Unsteadiness on feet: Secondary | ICD-10-CM

## 2021-05-25 DIAGNOSIS — M6281 Muscle weakness (generalized): Secondary | ICD-10-CM

## 2021-05-25 NOTE — Therapy (Signed)
[facility] MAIN [facility] SERVICES [address] [place], [place], [address] Phone: [phone]   Fax:  [phone]  Physical Therapy Treatment  Patient Details  Name: Kara Pennington MRN: [id number] Date of Birth: 13-Feb-1941 Referring Provider (PT): Dr. Lanny Cramp   Encounter Date: 05/25/2021   PT End of Session - 05/25/21 0906     Visit Number 47    Number of Visits 56    Date for PT Re-Evaluation 08/03/21    Authorization Type Medicare Primary; [employer] Secondary    Authorization Time Period Recert 02/15/2021-05/10/2021; 05/11/2021- 08/03/2021    Progress Note Due on Visit 50    PT Start Time 0855    PT Stop Time 0945    PT Time Calculation (min) 50 min    Equipment Utilized During Treatment Gait belt    Activity Tolerance Patient tolerated treatment well;Patient limited by fatigue;No increased pain    Behavior During Therapy WFL for tasks assessed/performed             Past Medical History:  Diagnosis Date   Anxiety    Depression    GERD (gastroesophageal reflux disease)    Hypertension     Past Surgical History:  Procedure Laterality Date   APPENDECTOMY     PARATHYROIDECTOMY      There were no vitals filed for this visit.   Subjective Assessment - 05/25/21 0901     Subjective Patient reports that she didn't sleep a bit last night but otherwise okay today.    Pertinent History 80 y.o. female presents to skilled physical therapy with a history of COVID-19 infection 1 year ago, hypothyroidism, and fall on 07/15/2020 with resultant nondisplaced right temporal bone fracture with associated minimal left parietal subarachnoid hemorrhage and a resolved right frontotemporal subdural hematoma who presented to [facility] hospital on 07/23/20 with intermitten but worsening word finding difficulties and expressive aphasia. While in the hospital, she had approximately 24 hours of speaking "in gibberish" and unable to speak. After about 1 day,  it was much improved and she was transferred to [facility] and Rehab. She normally lives in an independent living apartment. During the week, daughter stated that she was overall doing better though she still would mix up words. On the day of admit, the daughter spoke to her at around noon and she said they had a "normal" conversation. She arrived at [facility] to visit with her at 2:15 and immediately felt that her mother was not well. She was back to speaking non-sensically. Daughter requested return to [facility]. Upon arrival she was able to follow commands and answer some questions accurately, however, she was having considerable word finding difficulties and seemed somewhat frustrated. 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    Currently in Pain? No/denies              INTERVENTIONS:     Therapeutic exercises:   Circuit training:  UE: Shoulder press with 2# dumbell x 12 reps BUE LE: seated ham curls with GTB x12  BLE Cardio: up/down 4 steps with B rails- step over step. HR= 106 bpm; O2 sat =96%    UE: Scap retract with GTB  LE: Sit to stand from chair without UE  x 10 reps (RPE= 8/10)  *patient required 2 min rest before continuing Cardio: up/down 4 steps with B rails - step over step  UE: Horizontal shoulder ABD LE: Static stand x 1 min without UE support and  the next 1 min with BUE support Cardio: Ambulation with 4WW, CGA using gait belt x 160 feet- patient able to stand more erect    Interval Nustep training with continuous monitoring of HR/O2 Sat and seat level at 6 and using BUE at 6 1 min 30 sec at Level 0 30 sec at level 3 1 min 30 sec at level 0 (RPE= 4/10) HR= 86 bpm; O2 sat= 96 30 sec level 4 1 min 30 sec at level 0 30 sec level at level 5 (RPE= 6/10) 1 min 30 sec 30 sec at level 5 RPE =7/10 and HR= 88 bpm and O2 sat =95%. Patient very fatigued at end of session and reported "Wow, that was good and I am gonna  feel that later."  Total distance = 0.25 mi and 8 min total time   Education provided throughout session via VC/TC and demonstration to facilitate movement at target joints and correct muscle activation for all testing and exercises performed.    Clinical Impression: Patient presents with good motivation despite complaint of being tired today. She was able to complete 3 cycles of circuit training consisting of an upper body, lower body, and functional endurance activity with adequate rest breaks. She then completed an interval Nustep activity and able to complete all activities with RPE between 3-7/10 throughout. She will benefit from further skilled therapy to improve LE strength and to improve endurance/functional capacity to increase quality of life.               PT Education - 05/25/21 0905     Education Details Exercise technique    Person(s) Educated Patient    Methods Explanation;Demonstration;Tactile cues;Verbal cues    Comprehension Verbalized understanding;Returned demonstration;Verbal cues required;Need further instruction              PT Short Term Goals - 11/10/20 7510       PT SHORT TERM GOAL #1   Title Patient will be independent in home exercise program to improve strength/mobility for better functional independence with ADLs.    Baseline 09/26/2020- Patient requires cues for Breathing techniques with walking and reminders to use the BORG RPE scale. She is knowledgeable of seated LE strengthening but will continue to benefit from progression of HEP to more progressive exercises. 11/10/2020- Patient able to verbalize and demo good working knowledge of breathing technqiues, BORG Scale, and seated/standing therex along with walking program with no questions or concerns at this time.    Time 6    Period Weeks    Status Achieved    Target Date 09/27/20               PT Long Term Goals - 05/11/21 0918       PT LONG TERM GOAL #1   Title Patient will  increase FOTO score to equal to or greater than to 61 demonstrate statistically significant improvement in mobility and quality of life.    Baseline 02/08: 45; 11/10/2020= 49; 02/15/2021=59%; 05/11/2021= 51%    Time 12    Period Weeks    Status On-going    Target Date 08/03/21      PT LONG TERM GOAL #2   Title Patient (> 16 years old) will complete five times sit to stand test in < 15 seconds indicating an increased LE strength and improved balance.    Baseline 02/08: 19s without BUE support. 09/26/2020= 16.32 sec without UE support. 11/10/2020= 17.03 sec without UE support.  01/25/2021- 13.87 sec without UE Support-  will keep goal active to ensure patient remains consistent. 02/15/2021= 11.8 sec without UE support    Time 12    Period Weeks    Status Achieved      PT LONG TERM GOAL #3   Title Patient will increase 10 meter walk test to >1.70m/s as to improve gait speed for better community ambulation and to reduce fall risk.    Baseline 02/08:0.37 m/s. 09/26/2020= 0.59 m/s using 4WW. 11/10/2020= 0.75 m/s using 4WW. 01/25/2021= 0.68 m/s using 4WW. 02/15/2021= 0.71 m/s using 4WW; 04/27/2021= 0.91 m/s using 4WW; 05/11/2021= 0.8 m/s    Time 12    Period Weeks    Status On-going    Target Date 08/03/21      PT LONG TERM GOAL #4   Title Patient will reduce timed up and go to <11 seconds to reduce fall risk and demonstrate improved transfer/gait ability.    Baseline 02/08: 33.43 with 2WW. 09/26/2020= 22.18 sec with 7DZ; 11/10/2020=19.33 with 4WW. 01/25/2021= 16.16 sec using 4WW. 02/15/2021- Will assess next visit; 04/20/2021= 15.11 using 4WW; 05/11/2021= TUG avg of 2 tests using 4WW= 15.1 sec.    Time 12    Period Weeks    Status On-going    Target Date 08/03/21      PT LONG TERM GOAL #5   Title Patient will increase ABC scale score >80% to demonstrate better functional mobility and better confidence with ADLs.    Baseline 02/08: 48.25%; 11/10/2020= 53.13. 02/15/2021=56.25 04/27/2021= 70%; 05/11/2021= 72.50%     Time 12    Period Weeks    Status On-going    Target Date 08/03/21      PT LONG TERM GOAL #6   Title Patient will demonstrate improved 6 min walk test from only able to complete 100 feet in  prior to stoppage from fatigue to completing test at 550 feet or more without rest break, reporting some difficulty or less to improve walking tolerance with community ambulation including walking to dinining hall at independent living facility, grocery shopping, going to church,etc.    Baseline 09/26/2020= 100 feet in 1 min using 4WW- stopped due to fatigue. 11/10/2020= 555 feet with 4WW in 4 min 28 sec.  01/25/2021= 250 feet in 2 min 30 sec using 4WW- stopped due to fatigue today. 02/15/2021= 200 feet in 1 min 53 sec using 4WW- Patient reports fatigue as well as active Lupus Flare up. 04/27/2021= 280 feet in 1 min 53 sec using 4WW.  05/11/2021= 300 feet in 2 min 38 sec using 4WW    Time 12    Period Weeks    Status On-going    Target Date 08/03/21                   Plan - 05/25/21 0907     Clinical Impression Statement Patient presents with good motivation despite complaint of being tired today. She was able to complete 3 cycles of circuit training consisting of an upper body, lower body, and functional endurance activity with adequate rest breaks. She then completed an interval Nustep activity and able to complete all activities with RPE between 3-7/10 throughout. She will benefit from further skilled therapy to improve LE strength and to improve endurance/functional capacity to increase quality of life.    Personal Factors and Comorbidities Age;Comorbidity 3+    Comorbidities TIA, aphasia, acute bronchitis    Examination-Activity Limitations Stairs;Squat;Bend;Stand;Transfers;Lift;Caring for Others;Carry    Examination-Participation Restrictions Cleaning;Community Activity;Driving;Shop;Yard Work    Conservation officer, historic buildings Evolving/Moderate  complexity    Rehab Potential Fair    PT  Frequency 2x / week    PT Duration 12 weeks    PT Treatment/Interventions ADLs/Self Care Home Management;Canalith Repostioning;Moist Heat;Traction;Electrical Stimulation;Gait training;Stair training;Functional mobility training;Therapeutic activities;Therapeutic exercise;Balance training;Neuromuscular re-education;Patient/family education;Passive range of motion;Energy conservation;Cryotherapy;DME Instruction;Manual techniques;Dry needling;Joint Manipulations    PT Next Visit Plan progress strengthening and balance exercises, progress cardiovascular endurance training; progress standing therex, progress volume of distance amb.    PT Home Exercise Plan No updates today, Encouraged walking to dining hall everyday as able.    Consulted and Agree with Plan of Care Patient    Family Member Consulted daughter             Patient will benefit from skilled therapeutic intervention in order to improve the following deficits and impairments:  Abnormal gait, Decreased endurance, Cardiopulmonary status limiting activity, Decreased activity tolerance, Decreased strength, Decreased balance, Decreased mobility, Difficulty walking, Improper body mechanics, Decreased safety awareness, Postural dysfunction  Visit Diagnosis: Abnormality of gait and mobility  Difficulty in walking, not elsewhere classified  Muscle weakness (generalized)  Unsteadiness on feet     Problem List Patient Active Problem List   Diagnosis Date Noted   TIA (transient ischemic attack) 02/03/2020   Aphasia    Acute bronchitis 10/06/2016    Lenda Kelp, PT 05/25/2021, 10:08 AM  [facility] MAIN [facility] SERVICES [address] [place], [place], [address] Phone: [phone]   Fax:  [phone]  Name: Kara Pennington MRN: [id number] Date of Birth: 09/05/1940

## 2021-05-30 ENCOUNTER — Ambulatory Visit: Payer: Medicare Other

## 2021-05-30 ENCOUNTER — Other Ambulatory Visit: Payer: Self-pay

## 2021-05-30 DIAGNOSIS — R269 Unspecified abnormalities of gait and mobility: Secondary | ICD-10-CM

## 2021-05-30 DIAGNOSIS — M6281 Muscle weakness (generalized): Secondary | ICD-10-CM

## 2021-05-30 DIAGNOSIS — R2681 Unsteadiness on feet: Secondary | ICD-10-CM

## 2021-05-30 DIAGNOSIS — R262 Difficulty in walking, not elsewhere classified: Secondary | ICD-10-CM

## 2021-05-30 NOTE — Therapy (Signed)
[facility] MAIN [facility] SERVICES [address] [place], [place], [address] Phone: [phone]   Fax:  [phone]  Physical Therapy Treatment  Patient Details  Name: Kara Pennington MRN: [id number] Date of Birth: [birth year] Referring Provider (PT): Dr. Lanny Cramp   Encounter Date: 05/30/2021   PT End of Session - 05/30/21 1325     Visit Number 48    Number of Visits 56    Date for PT Re-Evaluation 08/03/21    Authorization Type Medicare Primary; [employer] Secondary    Authorization Time Period Recert 02/15/2021-05/10/2021; 05/11/2021- 08/03/2021    Progress Note Due on Visit 50    PT Start Time 1100    PT Stop Time 1144    PT Time Calculation (min) 44 min    Equipment Utilized During Treatment Gait belt    Activity Tolerance Patient tolerated treatment well;Patient limited by fatigue;No increased pain    Behavior During Therapy WFL for tasks assessed/performed             Past Medical History:  Diagnosis Date   Anxiety    Depression    GERD (gastroesophageal reflux disease)    Hypertension     Past Surgical History:  Procedure Laterality Date   APPENDECTOMY     PARATHYROIDECTOMY      There were no vitals filed for this visit.   Subjective Assessment - 05/30/21 1324     Subjective Patient reports doing well overall -denies any pain or falls.    Pertinent History 80 y.o. female presents to skilled physical therapy with a history of COVID-19 infection 1 year ago, hypothyroidism, and fall on 07/15/2020 with resultant nondisplaced right temporal bone fracture with associated minimal left parietal subarachnoid hemorrhage and a resolved right frontotemporal subdural hematoma who presented to [facility] hospital on 07/23/20 with intermitten but worsening word finding difficulties and expressive aphasia. While in the hospital, she had approximately 24 hours of speaking "in gibberish" and unable to speak. After about 1 day, it was much  improved and she was transferred to [facility]. She normally lives in an independent living apartment. During the week, daughter stated that she was overall doing better though she still would mix up words. On the day of admit, the daughter spoke to her at around noon and she said they had a "normal" conversation. She arrived at [facility] to visit with her at 2:15 and immediately felt that her mother was not well. She was back to speaking non-sensically. Daughter requested return to [facility]. Upon arrival she was able to follow commands and answer some questions accurately, however, she was having considerable word finding difficulties and seemed somewhat frustrated. 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    Currently in Pain? No/denies             INTERVENTIONS:   Therapeutic Exercises:    Interval Nustep training with continuous monitoring of HR/O2 Sat and seat level at 6 and using BUE at 6 1 min at L0 30 sec at level 3 1 min L0 (RPE= 3/10) HR= 84 bpm; O2 sat= 96 30 sec L4 1 min at L0 30 sec at L5 (RPE= 8/10) 1 min- L0 30 sec - L4 1 min - L0 1 min L2 RPE =7/10 and HR= 90 bpm and O2 sat =96%. Total distance = 0.27 mi and 8 min total time  Introduction to gym based exercises in [employer]:   - knee ext= Level 3 (  3 sets of 10 reps) - Patient reported 8/10 on RPE when completed. -ham curl- L3 (3 sets of 12 reps) - Patient reports at a 5/10 on RPE  -Leg press Level 3- BLE-3 sets of 12 reps (patient reported 6/10 when done. Education provided throughout session via VC/TC and demonstration to facilitate movement at target joints and correct muscle activation for all testing and exercises performed.  PT educated in each piece of equipment to ensure safe usage.   Clinical Impression: Patient challenged today with some simple based LE strengthening and patient was able to use new equipment with physical assist for set up and  reminders for correct technique. She fatigued quickly requiring rest but still able to accomplish 3 sets of each activity and able to improve her distance on Nustep without over exertion.She will benefit from further skilled therapy to improve LE strength and to improve endurance/functional capacity to increase quality of life                         PT Education - 05/30/21 1325     Education Details Gym exercise machine technique/safety    Person(s) Educated Patient    Methods Explanation;Demonstration;Tactile cues;Verbal cues;Other (comment)    Comprehension Verbalized understanding;Returned demonstration;Verbal cues required;Need further instruction;Tactile cues required              PT Short Term Goals - 11/10/20 0609       PT SHORT TERM GOAL #1   Title Patient will be independent in home exercise program to improve strength/mobility for better functional independence with ADLs.    Baseline 09/26/2020- Patient requires cues for Breathing techniques with walking and reminders to use the BORG RPE scale. She is knowledgeable of seated LE strengthening but will continue to benefit from progression of HEP to more progressive exercises. 11/10/2020- Patient able to verbalize and demo good working knowledge of breathing technqiues, BORG Scale, and seated/standing therex along with walking program with no questions or concerns at this time.    Time 6    Period Weeks    Status Achieved    Target Date 09/27/20               PT Long Term Goals - 05/11/21 0918       PT LONG TERM GOAL #1   Title Patient will increase FOTO score to equal to or greater than to 61 demonstrate statistically significant improvement in mobility and quality of life.    Baseline 02/08: 45; 11/10/2020= 49; 02/15/2021=59%; 05/11/2021= 51%    Time 12    Period Weeks    Status On-going    Target Date 08/03/21      PT LONG TERM GOAL #2   Title Patient (> 28 years old) will complete five times sit  to stand test in < 15 seconds indicating an increased LE strength and improved balance.    Baseline 02/08: 19s without BUE support. 09/26/2020= 16.32 sec without UE support. 11/10/2020= 17.03 sec without UE support.  01/25/2021- 13.87 sec without UE Support- will keep goal active to ensure patient remains consistent. 02/15/2021= 11.8 sec without UE support    Time 12    Period Weeks    Status Achieved      PT LONG TERM GOAL #3   Title Patient will increase 10 meter walk test to >1.24m/s as to improve gait speed for better community ambulation and to reduce fall risk.    Baseline 02/08:0.37 m/s. 09/26/2020= 0.59 m/s  using 4WW. 11/10/2020= 0.75 m/s using 4WW. 01/25/2021= 0.68 m/s using 4WW. 02/15/2021= 0.71 m/s using 4WW; 04/27/2021= 0.91 m/s using 4WW; 05/11/2021= 0.8 m/s    Time 12    Period Weeks    Status On-going    Target Date 08/03/21      PT LONG TERM GOAL #4   Title Patient will reduce timed up and go to <11 seconds to reduce fall risk and demonstrate improved transfer/gait ability.    Baseline 02/08: 33.43 with 2WW. 09/26/2020= 22.18 sec with 6VZ; 11/10/2020=19.33 with 4WW. 01/25/2021= 16.16 sec using 4WW. 02/15/2021- Will assess next visit; 04/20/2021= 15.11 using 4WW; 05/11/2021= TUG avg of 2 tests using 4WW= 15.1 sec.    Time 12    Period Weeks    Status On-going    Target Date 08/03/21      PT LONG TERM GOAL #5   Title Patient will increase ABC scale score >80% to demonstrate better functional mobility and better confidence with ADLs.    Baseline 02/08: 48.25%; 11/10/2020= 53.13. 02/15/2021=56.25 04/27/2021= 70%; 05/11/2021= 72.50%    Time 12    Period Weeks    Status On-going    Target Date 08/03/21      PT LONG TERM GOAL #6   Title Patient will demonstrate improved 6 min walk test from only able to complete 100 feet in  prior to stoppage from fatigue to completing test at 550 feet or more without rest break, reporting some difficulty or less to improve walking tolerance with community  ambulation including walking to dinining hall at independent living facility, grocery shopping, going to church,etc.    Baseline 09/26/2020= 100 feet in 1 min using 4WW- stopped due to fatigue. 11/10/2020= 555 feet with 4WW in 4 min 28 sec.  01/25/2021= 250 feet in 2 min 30 sec using 4WW- stopped due to fatigue today. 02/15/2021= 200 feet in 1 min 53 sec using 4WW- Patient reports fatigue as well as active Lupus Flare up. 04/27/2021= 280 feet in 1 min 53 sec using 4WW.  05/11/2021= 300 feet in 2 min 38 sec using 4WW    Time 12    Period Weeks    Status On-going    Target Date 08/03/21                   Plan - 05/30/21 1326     Clinical Impression Statement Patient challenged today with some simple based LE strengthening and patient was able to use new equipment with physical assist for set up and reminders for correct technique. She fatigued quickly requiring rest but still able to accomplish 3 sets of each activity and able to improve her distance on Nustep without over exertion.She will benefit from further skilled therapy to improve LE strength and to improve endurance/functional capacity to increase quality of life    Personal Factors and Comorbidities Age;Comorbidity 3+    Comorbidities TIA, aphasia, acute bronchitis    Examination-Activity Limitations Stairs;Squat;Bend;Stand;Transfers;Lift;Caring for Others;Carry    Examination-Participation Restrictions Cleaning;Community Activity;Driving;Shop;Yard Work    Stability/Clinical Decision Making Evolving/Moderate complexity    Rehab Potential Fair    PT Frequency 2x / week    PT Duration 12 weeks    PT Treatment/Interventions ADLs/Self Care Home Management;Canalith Repostioning;Moist Heat;Traction;Electrical Stimulation;Gait training;Stair training;Functional mobility training;Therapeutic activities;Therapeutic exercise;Balance training;Neuromuscular re-education;Patient/family education;Passive range of motion;Energy  conservation;Cryotherapy;DME Instruction;Manual techniques;Dry needling;Joint Manipulations    PT Next Visit Plan progress strengthening and balance exercises, progress cardiovascular endurance training; progress standing therex, progress volume of distance amb.  PT Home Exercise Plan No updates today, Encouraged walking to dining hall everyday as able.    Consulted and Agree with Plan of Care Patient    Family Member Consulted daughter             Patient will benefit from skilled therapeutic intervention in order to improve the following deficits and impairments:  Abnormal gait, Decreased endurance, Cardiopulmonary status limiting activity, Decreased activity tolerance, Decreased strength, Decreased balance, Decreased mobility, Difficulty walking, Improper body mechanics, Decreased safety awareness, Postural dysfunction  Visit Diagnosis: Abnormality of gait and mobility  Difficulty in walking, not elsewhere classified  Muscle weakness (generalized)  Unsteadiness on feet     Problem List Patient Active Problem List   Diagnosis Date Noted   TIA (transient ischemic attack) 02/03/2020   Aphasia    Acute bronchitis 10/06/2016    Lenda Kelp, PT 05/30/2021, 1:36 PM  [facility] MAIN [facility] SERVICES [address] [place], [place], [address] Phone: [phone]   Fax:  [phone]  Name: Kara Pennington MRN: [id number] Date of Birth: February 25, 1941

## 2021-06-06 ENCOUNTER — Ambulatory Visit: Payer: Medicare Other

## 2021-06-08 ENCOUNTER — Ambulatory Visit: Payer: Medicare Other | Attending: Physical Medicine and Rehabilitation

## 2021-06-08 ENCOUNTER — Other Ambulatory Visit: Payer: Self-pay

## 2021-06-08 DIAGNOSIS — R2681 Unsteadiness on feet: Secondary | ICD-10-CM | POA: Insufficient documentation

## 2021-06-08 DIAGNOSIS — R269 Unspecified abnormalities of gait and mobility: Secondary | ICD-10-CM | POA: Insufficient documentation

## 2021-06-08 DIAGNOSIS — R262 Difficulty in walking, not elsewhere classified: Secondary | ICD-10-CM | POA: Insufficient documentation

## 2021-06-08 DIAGNOSIS — M6281 Muscle weakness (generalized): Secondary | ICD-10-CM | POA: Insufficient documentation

## 2021-06-08 NOTE — Therapy (Signed)
[facility] MAIN [facility] SERVICES [address] [place], [place], [address] Phone: [phone]   Fax:  [phone]  Physical Therapy Treatment  Patient Details  Name: Kara Pennington MRN: [id number] Date of Birth: 1940-10-04 Referring Provider (PT): Dr. Lanny Cramp   Encounter Date: 06/08/2021   PT End of Session - 06/08/21 0909     Visit Number 49    Number of Visits 56    Date for PT Re-Evaluation 08/03/21    Authorization Type Medicare Primary; [employer] Secondary    Authorization Time Period Recert 02/15/2021-05/10/2021; 05/11/2021- 08/03/2021    Progress Note Due on Visit 50    PT Start Time 0902    PT Stop Time 0941    PT Time Calculation (min) 39 min    Equipment Utilized During Treatment Gait belt    Activity Tolerance Patient tolerated treatment well;Patient limited by fatigue;No increased pain    Behavior During Therapy WFL for tasks assessed/performed             Past Medical History:  Diagnosis Date   Anxiety    Depression    GERD (gastroesophageal reflux disease)    Hypertension     Past Surgical History:  Procedure Laterality Date   APPENDECTOMY     PARATHYROIDECTOMY      There were no vitals filed for this visit.   Subjective Assessment - 06/08/21 0903     Subjective Patient reports feeling well today but states the Holiday season is all hard for her.    Pertinent History 80 y.o. female presents to skilled physical therapy with a history of COVID-19 infection 1 year ago, hypothyroidism, and fall on 07/15/2020 with resultant nondisplaced right temporal bone fracture with associated minimal left parietal subarachnoid hemorrhage and a resolved right frontotemporal subdural hematoma who presented to [facility] hospital on 07/23/20 with intermitten but worsening word finding difficulties and expressive aphasia. While in the hospital, she had approximately 24 hours of speaking "in gibberish" and unable to speak. After about 1  day, it was much improved and she was transferred to [facility]. She normally lives in an independent living apartment. During the week, daughter stated that she was overall doing better though she still would mix up words. On the day of admit, the daughter spoke to her at around noon and she said they had a "normal" conversation. She arrived at [facility] to visit with her at 2:15 and immediately felt that her mother was not well. She was back to speaking non-sensically. Daughter requested return to [facility]. Upon arrival she was able to follow commands and answer some questions accurately, however, she was having considerable word finding difficulties and seemed somewhat frustrated. 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    Currently in Pain? No/denies              INTERVENTIONS:   Therapeutic Exercises:   Interval training on Nustep 1 min 30 sec at L1 1 min L3 1 min 30 sec at L1 1 min L4  RPE= 4/10; O2 sat=97%; HR=85 bpm VC to keep SPM > 40  and patient utilized BUE as well  Circuit style training:   1)Standing scap row (matrix cable system)- 7.5lb x 10 reps (VC for technique and patient required seated rest break  2)Standing TRX- Hip flex BLE x 10 reps (patient reported as "Very difficult"- initially stating "I can't do it" but able to continue with encouragement and complete.  3) Walk in clinic using 4WW- 80 feet   RPE= 4/10  Next round    1) Standing scap row (matrix cable system)- 7.5lb x 10 reps (VC for technique and patient required seated rest break  2)Standing TRX-[place] BLE x 10 reps - VC for correct technique   3) Walk in clinic using 4WW- 80 feet   RPE= 5/10  Next round    1) Standing scap row (matrix cable system)- 7.5lb x 10 reps (VC for technique and patient required seated rest break  2)Standing TRX-[place] BLE x 10 reps - VC for correct technique   3) Walk in clinic using 4WW-  150 feet   RPE= 6/10  *Patient reports very fatigued and request to cease treatment.   Education provided throughout session via VC/TC and demonstration to facilitate movement at target joints and correct muscle activation for all testing and exercises performed.    Patient presented with good motivation today- receptive to [facility] while performing Nustep and later therapeutic exercises. She was quick to fatigue today with standing yet overall presented with much improved ability to stand during session versus previous visits. will benefit from further skilled therapy to improve LE strength and to improve endurance/functional capacity to increase quality of life           PT Education - 06/08/21 0908     Education Details Exercise technique    Person(s) Educated Patient    Methods Explanation;Demonstration;Tactile cues;Verbal cues    Comprehension Verbalized understanding;Returned demonstration;Verbal cues required              PT Short Term Goals - 11/10/20 0609       PT SHORT TERM GOAL #1   Title Patient will be independent in home exercise program to improve strength/mobility for better functional independence with ADLs.    Baseline 09/26/2020- Patient requires cues for Breathing techniques with walking and reminders to use the BORG RPE scale. She is knowledgeable of seated LE strengthening but will continue to benefit from progression of HEP to more progressive exercises. 11/10/2020- Patient able to verbalize and demo good working knowledge of breathing technqiues, BORG Scale, and seated/standing therex along with walking program with no questions or concerns at this time.    Time 6    Period Weeks    Status Achieved    Target Date 09/27/20               PT Long Term Goals - 05/11/21 0918       PT LONG TERM GOAL #1   Title Patient will increase FOTO score to equal to or greater than to 61 demonstrate statistically significant improvement in mobility and quality of  life.    Baseline 02/08: 45; 11/10/2020= 49; 02/15/2021=59%; 05/11/2021= 51%    Time 12    Period Weeks    Status On-going    Target Date 08/03/21      PT LONG TERM GOAL #2   Title Patient (> 71 years old) will complete five times sit to stand test in < 15 seconds indicating an increased LE strength and improved balance.    Baseline 02/08: 19s without BUE support. 09/26/2020= 16.32 sec without UE support. 11/10/2020= 17.03 sec without UE support.  01/25/2021- 13.87 sec without UE Support- will keep goal active to ensure patient remains consistent. 02/15/2021= 11.8 sec without UE support    Time 12    Period Weeks    Status Achieved      PT LONG TERM GOAL #3  Title Patient will increase 10 meter walk test to >1.10m/s as to improve gait speed for better community ambulation and to reduce fall risk.    Baseline 02/08:0.37 m/s. 09/26/2020= 0.59 m/s using 4WW. 11/10/2020= 0.75 m/s using 4WW. 01/25/2021= 0.68 m/s using 4WW. 02/15/2021= 0.71 m/s using 4WW; 04/27/2021= 0.91 m/s using 4WW; 05/11/2021= 0.8 m/s    Time 12    Period Weeks    Status On-going    Target Date 08/03/21      PT LONG TERM GOAL #4   Title Patient will reduce timed up and go to <11 seconds to reduce fall risk and demonstrate improved transfer/gait ability.    Baseline 02/08: 33.43 with 2WW. 09/26/2020= 22.18 sec with 6OZ; 11/10/2020=19.33 with 4WW. 01/25/2021= 16.16 sec using 4WW. 02/15/2021- Will assess next visit; 04/20/2021= 15.11 using 4WW; 05/11/2021= TUG avg of 2 tests using 4WW= 15.1 sec.    Time 12    Period Weeks    Status On-going    Target Date 08/03/21      PT LONG TERM GOAL #5   Title Patient will increase ABC scale score >80% to demonstrate better functional mobility and better confidence with ADLs.    Baseline 02/08: 48.25%; 11/10/2020= 53.13. 02/15/2021=56.25 04/27/2021= 70%; 05/11/2021= 72.50%    Time 12    Period Weeks    Status On-going    Target Date 08/03/21      PT LONG TERM GOAL #6   Title Patient will demonstrate  improved 6 min walk test from only able to complete 100 feet in  prior to stoppage from fatigue to completing test at 550 feet or more without rest break, reporting some difficulty or less to improve walking tolerance with community ambulation including walking to dinining hall at independent living facility, grocery shopping, going to church,etc.    Baseline 09/26/2020= 100 feet in 1 min using 4WW- stopped due to fatigue. 11/10/2020= 555 feet with 4WW in 4 min 28 sec.  01/25/2021= 250 feet in 2 min 30 sec using 4WW- stopped due to fatigue today. 02/15/2021= 200 feet in 1 min 53 sec using 4WW- Patient reports fatigue as well as active Lupus Flare up. 04/27/2021= 280 feet in 1 min 53 sec using 4WW.  05/11/2021= 300 feet in 2 min 38 sec using 4WW    Time 12    Period Weeks    Status On-going    Target Date 08/03/21                   Plan - 06/08/21 0909     Clinical Impression Statement Patient presented with good motivation today- receptive to [facility] while performing Nustep and later therapeutic exercises. She was quick to fatigue today with standing yet overall presented with much improved ability to stand during session versus previous visits. will benefit from further skilled therapy to improve LE strength and to improve endurance/functional capacity to increase quality of life    Personal Factors and Comorbidities Age;Comorbidity 3+    Comorbidities TIA, aphasia, acute bronchitis    Examination-Activity Limitations Stairs;Squat;Bend;Stand;Transfers;Lift;Caring for Others;Carry    Examination-Participation Restrictions Cleaning;Community Activity;Driving;Shop;Yard Work    Stability/Clinical Decision Making Evolving/Moderate complexity    Rehab Potential Fair    PT Frequency 2x / week    PT Duration 12 weeks    PT Treatment/Interventions ADLs/Self Care Home Management;Canalith Repostioning;Moist Heat;Traction;Electrical Stimulation;Gait training;Stair training;Functional mobility  training;Therapeutic activities;Therapeutic exercise;Balance training;Neuromuscular re-education;Patient/family education;Passive range of motion;Energy conservation;Cryotherapy;DME Instruction;Manual techniques;Dry needling;Joint Manipulations    PT Next Visit Plan  progress strengthening and balance exercises, progress cardiovascular endurance training; progress standing therex, progress volume of distance amb.    PT Home Exercise Plan No updates today, Encouraged walking to dining hall everyday as able.    Consulted and Agree with Plan of Care Patient    Family Member Consulted daughter             Patient will benefit from skilled therapeutic intervention in order to improve the following deficits and impairments:  Abnormal gait, Decreased endurance, Cardiopulmonary status limiting activity, Decreased activity tolerance, Decreased strength, Decreased balance, Decreased mobility, Difficulty walking, Improper body mechanics, Decreased safety awareness, Postural dysfunction  Visit Diagnosis: Abnormality of gait and mobility  Difficulty in walking, not elsewhere classified  Muscle weakness (generalized)  Unsteadiness on feet     Problem List Patient Active Problem List   Diagnosis Date Noted   TIA (transient ischemic attack) 02/03/2020   Aphasia    Acute bronchitis 10/06/2016    Lenda Kelp, PT 06/08/2021, 10:05 AM  [place] [facility] MAIN [facility] SERVICES [address] [place], [place], [address] Phone: [phone]   Fax:  [phone]  Name: MIOSHA BEHE MRN: [id number] Date of Birth: [birth year]

## 2021-06-13 ENCOUNTER — Ambulatory Visit: Payer: Medicare Other

## 2021-06-13 ENCOUNTER — Other Ambulatory Visit: Payer: Self-pay

## 2021-06-13 DIAGNOSIS — R2681 Unsteadiness on feet: Secondary | ICD-10-CM

## 2021-06-13 DIAGNOSIS — M6281 Muscle weakness (generalized): Secondary | ICD-10-CM

## 2021-06-13 DIAGNOSIS — R262 Difficulty in walking, not elsewhere classified: Secondary | ICD-10-CM

## 2021-06-13 DIAGNOSIS — R269 Unspecified abnormalities of gait and mobility: Secondary | ICD-10-CM | POA: Diagnosis not present

## 2021-06-13 NOTE — Therapy (Addendum)
[place] [facility] MAIN [facility] SERVICES [address] [place], [place], [address] Phone: [phone]   Fax:  [phone]  Physical Therapy Treatment/Physical Therapy Progress Note   Dates of reporting period  04/27/2021   to   06/13/2021  Patient Details  Name: Kara Pennington MRN: [id number] Date of Birth: 1940/12/19 Referring Provider (PT): Dr. Lanny Cramp   Encounter Date: 06/13/2021   PT End of Session - 06/13/21 0941     Visit Number 50    Number of Visits 56    Date for PT Re-Evaluation 08/03/21    Authorization Type Medicare Primary; [employer]et[employer] Secondary    Authorization Time Period Recert 02/15/2021-05/10/2021; 05/11/2021- 08/03/2021    Progress Note Due on Visit 60    PT Start Time 0845    PT Stop Time 0928    PT Time Calculation (min) 43 min    Equipment Utilized During Treatment Gait belt    Activity Tolerance Patient tolerated treatment well;Patient limited by fatigue;No increased pain    Behavior During Therapy WFL for tasks assessed/performed             Past Medical History:  Diagnosis Date   Anxiety    Depression    GERD (gastroesophageal reflux disease)    Hypertension     Past Surgical History:  Procedure Laterality Date   APPENDECTOMY     PARATHYROIDECTOMY      There were no vitals filed for this visit.   Subjective Assessment - 06/13/21 0838     Subjective Pt reports she isn't having pain today and reports no major updates. She expresses feeling discouraged today.    Patient is accompained by: Family member    Pertinent History 80 y.o. female presents to skilled physical therapy with a history of COVID-19 infection 1 year ago, hypothyroidism, and fall on 07/15/2020 with resultant nondisplaced right temporal bone fracture with associated minimal left parietal subarachnoid hemorrhage and a resolved right frontotemporal subdural hematoma who presented to [facility] hospital on 07/23/20 with intermitten but worsening word  finding difficulties and expressive aphasia. While in the hospital, she had approximately 24 hours of speaking "in gibberish" and unable to speak. After about 1 day, it was much improved and she was transferred to [facility]. She normally lives in an independent living apartment. During the week, daughter stated that she was overall doing better though she still would mix up words. On the day of admit, the daughter spoke to her at around noon and she said they had a "normal" conversation. She arrived at [facility] to visit with her at 2:15 and immediately felt that her mother was not well. She was back to speaking non-sensically. Daughter requested return to [facility]. Upon arrival she was able to follow commands and answer some questions accurately, however, she was having considerable word finding difficulties and seemed somewhat frustrated. 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    Limitations Standing;Walking    How long can you sit comfortably? unlimited time    How long can you stand comfortably? less than 5 mins    How long can you walk comfortably? less than 5 mins    Patient Stated Goals "I want to be able to get back to church and rejoin the knitting club"    Currently in Pain? No/denies            INTERVENTION  Goal reassessment:  FOTO: 61  : 358 ft with 4WW and  CGA. Prior to test: SPO2 99%, HR 78 Post-test: SPO2 98% and HR 91. Test terminated at 3:45 due to fatigue.  : Pt ambulated 0.75 m/s with 4WW with the average of 2 trials.  TUG: 15.99 sec with 4WW with the average of 2 trials.  ABC: 69% - pt reported feeling discouraged when filling out this survey  Therex:  Interval training on Nustep 1 min 30 sec at L1 1 min L4 (6/10) 1 min 30 sec at L1 1 min L4  1 min 30 sec at L1 RPE= 7/10; O2 sat=98%; HR=69 bpm VC to keep SPM > 40  and patient utilized BUE as well    Clinical Impression: Patient  presented with good motivation for all assessments and interventions today. Pt shows improvements in from her last visit in both total distance and duration. Pt had minimal decreases in gait speed, and TUG speed, likely due to fatigue from improvements. Pt ABC score of 68.25% shows a slight decrease from last PN (72.5% on 11/3) but could be due to the pt feeling a bit discouraged today. Patient's condition has the potential to improve in response to therapy. Maximum improvement is yet to be obtained. The anticipated improvement is attainable and reasonable in a generally predictable time. The pt will benefit from further skilled therapy to improve LE strength and endurance/functional capacity to increase quality of life         PT Education - 06/13/21 0940     Education Details Goal reassessment findings, exercise technique    Person(s) Educated Patient    Methods Explanation;Verbal cues    Comprehension Verbalized understanding;Need further instruction;Verbal cues required              PT Short Term Goals - 11/10/20 0609       PT SHORT TERM GOAL #1   Title Patient will be independent in home exercise program to improve strength/mobility for better functional independence with ADLs.    Baseline 09/26/2020- Patient requires cues for Breathing techniques with walking and reminders to use the BORG RPE scale. She is knowledgeable of seated LE strengthening but will continue to benefit from progression of HEP to more progressive exercises. 11/10/2020- Patient able to verbalize and demo good working knowledge of breathing technqiues, BORG Scale, and seated/standing therex along with walking program with no questions or concerns at this time.    Time 6    Period Weeks    Status Achieved    Target Date 09/27/20               PT Long Term Goals - 06/13/21 0956       PT LONG TERM GOAL #1   Title Patient will increase FOTO score to equal to or greater than to 61 demonstrate  statistically significant improvement in mobility and quality of life.    Baseline 02/08: 45; 11/10/2020= 49; 02/15/2021=59%; 05/11/2021= 51%    Time 12    Period Weeks    Status On-going    Target Date 08/03/21      PT LONG TERM GOAL #2   Title Patient (> 31 years old) will complete five times sit to stand test in < 15 seconds indicating an increased LE strength and improved balance.    Baseline 02/08: 19s without BUE support. 09/26/2020= 16.32 sec without UE support. 11/10/2020= 17.03 sec without UE support.  01/25/2021- 13.87 sec without UE Support- will keep goal active to ensure patient remains consistent. 02/15/2021= 11.8 sec without UE support.  Time 12    Period Weeks    Status Achieved      PT LONG TERM GOAL #3   Title Patient will increase 10 meter walk test to >1.57m/s as to improve gait speed for better community ambulation and to reduce fall risk.    Baseline 02/08:0.37 m/s. 09/26/2020= 0.59 m/s using 4WW. 11/10/2020= 0.75 m/s using 4WW. 01/25/2021= 0.68 m/s using 4WW. 02/15/2021= 0.71 m/s using 4WW; 04/27/2021= 0.91 m/s using 4WW; 05/11/2021= 0.8 m/s; 06/13/2021= 0.75 m/s using 4WW    Time 12    Period Weeks    Status On-going    Target Date 08/03/21      PT LONG TERM GOAL #4   Title Patient will reduce timed up and go to <11 seconds to reduce fall risk and demonstrate improved transfer/gait ability.    Baseline 02/08: 33.43 with 2WW. 09/26/2020= 22.18 sec with 3YQ; 11/10/2020=19.33 with 6VH. 01/25/2021= 16.16 sec using 4WW. 02/15/2021- Will assess next visit; 04/20/2021= 15.11 using 4WW; 05/11/2021= TUG avg of 2 tests using 4WW= 15.1 sec; 06/13/2021= TUG avg of 2 tests using 4WW = 15.99 sec    Time 12    Period Weeks    Status On-going    Target Date 08/03/21      PT LONG TERM GOAL #5   Title Patient will increase ABC scale score >80% to demonstrate better functional mobility and better confidence with ADLs.    Baseline 02/08: 48.25%; 11/10/2020= 53.13. 02/15/2021=56.25 04/27/2021= 70%;  05/11/2021= 72.50%; 06/13/2021= 68.75%    Time 12    Period Weeks    Status On-going    Target Date 08/03/21      PT LONG TERM GOAL #6   Title Patient will demonstrate improved 6 min walk test from only able to complete 100 feet in  prior to stoppage from fatigue to completing test at 550 feet or more without rest break, reporting some difficulty or less to improve walking tolerance with community ambulation including walking to dinining hall at independent living facility, grocery shopping, going to church,etc.    Baseline 09/26/2020= 100 feet in 1 min using 4WW- stopped due to fatigue. 11/10/2020= 555 feet with 4WW in 4 min 28 sec.  01/25/2021= 250 feet in 2 min 30 sec using 4WW- stopped due to fatigue today. 02/15/2021= 200 feet in 1 min 53 sec using 4WW- Patient reports fatigue as well as active Lupus Flare up. 04/27/2021= 280 feet in 1 min 53 sec using 4WW.  05/11/2021= 300 feet in 2 min 38 sec using 4WW; 06/13/2021 = 358 feet in 3 min 45 sec using 4WW    Time 12    Period Weeks    Status On-going    Target Date 08/03/21                   Plan - 06/13/21 0942     Clinical Impression Statement Patient presented with good motivation for all assessments and interventions today. Pt shows improvements in from her last visit in both total distance (358 feet) and duration (3 min 45 sec). Pt had minimal decreases in gait speed, and TUG speed, likely due to fatigue from improvements. Pt ABC score of 68.25% shows a slight decrease from last PN (72.5% on 11/3) but could be due to the pt feeling a bit discouraged today. Patient's condition has the potential to improve in response to therapy. Maximum improvement is yet to be obtained. The anticipated improvement is attainable and reasonable in a generally  predictable time. The pt will benefit from further skilled therapy to improve LE strength and endurance/functional capacity to increase quality of life    Personal Factors and  Comorbidities Age;Comorbidity 3+    Comorbidities TIA, aphasia, acute bronchitis    Examination-Activity Limitations Stairs;Squat;Bend;Stand;Transfers;Lift;Caring for Others;Carry    Examination-Participation Restrictions Cleaning;Community Activity;Driving;Shop;Yard Work    Stability/Clinical Decision Making Evolving/Moderate complexity    Rehab Potential Fair    PT Frequency 2x / week    PT Duration 12 weeks    PT Treatment/Interventions ADLs/Self Care Home Management;Canalith Repostioning;Moist Heat;Traction;Electrical Stimulation;Gait training;Stair training;Functional mobility training;Therapeutic activities;Therapeutic exercise;Balance training;Neuromuscular re-education;Patient/family education;Passive range of motion;Energy conservation;Cryotherapy;DME Instruction;Manual techniques;Dry needling;Joint Manipulations    PT Next Visit Plan progress strengthening and balance exercises, progress cardiovascular endurance training; progress standing therex, progress volume of distance amb.    PT Home Exercise Plan No updates today, Encouraged walking to dining hall everyday as able.    Consulted and Agree with Plan of Care Patient    Family Member Consulted daughter             Patient will benefit from skilled therapeutic intervention in order to improve the following deficits and impairments:  Abnormal gait, Decreased endurance, Cardiopulmonary status limiting activity, Decreased activity tolerance, Decreased strength, Decreased balance, Decreased mobility, Difficulty walking, Improper body mechanics, Decreased safety awareness, Postural dysfunction  Visit Diagnosis: Difficulty in walking, not elsewhere classified  Muscle weakness (generalized)  Unsteadiness on feet     Problem List Patient Active Problem List   Diagnosis Date Noted   TIA (transient ischemic attack) 02/03/2020   Aphasia    Acute bronchitis 10/06/2016    Ramond Craver, SPT This entire session was performed  under direct supervision and direction of a licensed therapist/therapist assistant . I have personally read, edited and approve of the note as written.   Louis Meckel, PT  06/13/2021, 12:44 PM  [place] [facility] MAIN [facility] SERVICES [address] [place], [place], [address] Phone: [phone]   Fax:  [phone]  Name: Kara Pennington MRN: [id number] Date of Birth: [birth year]

## 2021-06-15 ENCOUNTER — Ambulatory Visit: Payer: Medicare Other

## 2021-06-15 ENCOUNTER — Other Ambulatory Visit: Payer: Self-pay

## 2021-06-15 DIAGNOSIS — R2681 Unsteadiness on feet: Secondary | ICD-10-CM

## 2021-06-15 DIAGNOSIS — R269 Unspecified abnormalities of gait and mobility: Secondary | ICD-10-CM | POA: Diagnosis not present

## 2021-06-15 DIAGNOSIS — M6281 Muscle weakness (generalized): Secondary | ICD-10-CM

## 2021-06-15 DIAGNOSIS — R262 Difficulty in walking, not elsewhere classified: Secondary | ICD-10-CM

## 2021-06-15 NOTE — Therapy (Signed)
[facility] MAIN [facility] SERVICES [address] [place], [place], [address] Phone: [phone]   Fax:  [phone]  Physical Therapy Treatment  Patient Details  Name: Kara Pennington MRN: [id number] Date of Birth: 02/06/1941 Referring Provider (PT): Dr. Lanny Cramp   Encounter Date: 06/15/2021   PT End of Session - 06/15/21 0908     Visit Number 51    Number of Visits 56    Date for PT Re-Evaluation 08/03/21    Authorization Type Medicare Primary; [employer] Secondary    Authorization Time Period Recert 02/15/2021-05/10/2021; 05/11/2021- 08/03/2021    Progress Note Due on Visit 60    PT Start Time 0855    PT Stop Time 0935    PT Time Calculation (min) 40 min    Equipment Utilized During Treatment Gait belt    Activity Tolerance Patient tolerated treatment well;Patient limited by fatigue;No increased pain    Behavior During Therapy WFL for tasks assessed/performed             Past Medical History:  Diagnosis Date   Anxiety    Depression    GERD (gastroesophageal reflux disease)    Hypertension     Past Surgical History:  Procedure Laterality Date   APPENDECTOMY     PARATHYROIDECTOMY      There were no vitals filed for this visit.   Subjective Assessment - 06/15/21 0901     Subjective Patient reports continued sadness around the Holidays but states her mood improved after putting up her Christmas tree.    Patient is accompained by: Family member    Pertinent History 80 y.o. female presents to skilled physical therapy with a history of COVID-19 infection 1 year ago, hypothyroidism, and fall on 07/15/2020 with resultant nondisplaced right temporal bone fracture with associated minimal left parietal subarachnoid hemorrhage and a resolved right frontotemporal subdural hematoma who presented to [facility] on 07/23/20 with intermitten but worsening word finding difficulties and expressive aphasia. While in the hospital, she had  approximately 24 hours of speaking "in gibberish" and unable to speak. After about 1 day, it was much improved and she was transferred to [facility]. She normally lives in an independent living apartment. During the week, daughter stated that she was overall doing better though she still would mix up words. On the day of admit, the daughter spoke to her at around noon and she said they had a "normal" conversation. She arrived at [facility] to visit with her at 2:15 and immediately felt that her mother was not well. She was back to speaking non-sensically. Daughter requested return to [facility] Upon arrival she was able to follow commands and answer some questions accurately, however, she was having considerable word finding difficulties and seemed somewhat frustrated. 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    Limitations Standing;Walking    How long can you sit comfortably? unlimited time    How long can you stand comfortably? less than 5 mins    How long can you walk comfortably? less than 5 mins    Patient Stated Goals "I want to be able to get back to church and rejoin the knitting club"    Currently in Pain? No/denies             INTERVENTIONS:   Therapeutic Exercises:  1st round -Knee ext with 3 sec hold -Shoulder press with 3lb dumbell -ambulation 80 feet with 4WW (O2 sat = 97%; HR=90  bpm) Patient reports as Hard   2nd round -Seated Hip march BLE  -Horizontal Shoulder abd x 12 reps -ambulation 80 feet with 4WW  (O2 sat= 95%; HR=88 bpm)   3rd round -hip abd up/over standing dumbell on floor -chest press with 3lb. Alt UE x 8 reps (stopped due to weakness) -ambulation 80 feet with 4WW  4th round  - Sit to stand x 10 x reps - Bicep curl 3lb. X 10 reps -Ambulation 80 feet with 4WW (O2 sat = 95% and HR= 90 bpm) - Patient reports very tired upon completion of tasks.   Education provided throughout session via  VC/TC and demonstration to facilitate movement at target joints and correct muscle activation for all testing and exercises performed.    Patient presented with good motivation and demonstrated progress with activity level - able to complete 4 cycles of exercises with decreased overall rest breaks and good ability to complete despite becoming fatigued. She was knowledgeable of pursed lip breathing and use of RPE Scale and able to use both without prompting today. She will benefit from further skilled therapy to improve LE strength and to improve endurance/functional capacity to increase quality of life                     PT Education - 06/15/21 0901     Education Details Exercise technique    Person(s) Educated Patient    Methods Explanation;Demonstration;Tactile cues;Verbal cues;Handout    Comprehension Verbalized understanding;Returned demonstration;Verbal cues required;Tactile cues required;Need further instruction              PT Short Term Goals - 11/10/20 6387       PT SHORT TERM GOAL #1   Title Patient will be independent in home exercise program to improve strength/mobility for better functional independence with ADLs.    Baseline 09/26/2020- Patient requires cues for Breathing techniques with walking and reminders to use the BORG RPE scale. She is knowledgeable of seated LE strengthening but will continue to benefit from progression of HEP to more progressive exercises. 11/10/2020- Patient able to verbalize and demo good working knowledge of breathing technqiues, BORG Scale, and seated/standing therex along with walking program with no questions or concerns at this time.    Time 6    Period Weeks    Status Achieved    Target Date 09/27/20               PT Long Term Goals - 06/13/21 0956       PT LONG TERM GOAL #1   Title Patient will increase FOTO score to equal to or greater than to 61 demonstrate statistically significant improvement in mobility and  quality of life.    Baseline 02/08: 45; 11/10/2020= 49; 02/15/2021=59%; 05/11/2021= 51%; 06/13/2021= 60%    Time 12    Period Weeks    Status On-going    Target Date 08/03/21      PT LONG TERM GOAL #2   Title Patient (> 51 years old) will complete five times sit to stand test in < 15 seconds indicating an increased LE strength and improved balance.    Baseline 02/08: 19s without BUE support. 09/26/2020= 16.32 sec without UE support. 11/10/2020= 17.03 sec without UE support.  01/25/2021- 13.87 sec without UE Support- will keep goal active to ensure patient remains consistent. 02/15/2021= 11.8 sec without UE support.    Time 12    Period Weeks    Status Achieved  PT LONG TERM GOAL #3   Title Patient will increase 10 meter walk test to >1.12m/s as to improve gait speed for better community ambulation and to reduce fall risk.    Baseline 02/08:0.37 m/s. 09/26/2020= 0.59 m/s using 4WW. 11/10/2020= 0.75 m/s using 4WW. 01/25/2021= 0.68 m/s using 4WW. 02/15/2021= 0.71 m/s using 4WW; 04/27/2021= 0.91 m/s using 4WW; 05/11/2021= 0.8 m/s; 06/13/2021= 0.75 m/s using 4WW    Time 12    Period Weeks    Status On-going    Target Date 08/03/21      PT LONG TERM GOAL #4   Title Patient will reduce timed up and go to <11 seconds to reduce fall risk and demonstrate improved transfer/gait ability.    Baseline 02/08: 33.43 with 2WW. 09/26/2020= 22.18 sec with 9VQ; 11/10/2020=19.33 with 9IH. 01/25/2021= 16.16 sec using 4WW. 02/15/2021- Will assess next visit; 04/20/2021= 15.11 using 4WW; 05/11/2021= TUG avg of 2 tests using 4WW= 15.1 sec; 06/13/2021= TUG avg of 2 tests using 4WW = 15.99 sec    Time 12    Period Weeks    Status On-going    Target Date 08/03/21      PT LONG TERM GOAL #5   Title Patient will increase ABC scale score >80% to demonstrate better functional mobility and better confidence with ADLs.    Baseline 02/08: 48.25%; 11/10/2020= 53.13. 02/15/2021=56.25 04/27/2021= 70%; 05/11/2021= 72.50%; 06/13/2021= 68.75%     Time 12    Period Weeks    Status On-going    Target Date 08/03/21      PT LONG TERM GOAL #6   Title Patient will demonstrate improved 6 min walk test from only able to complete 100 feet in  prior to stoppage from fatigue to completing test at 550 feet or more without rest break, reporting some difficulty or less to improve walking tolerance with community ambulation including walking to dinining hall at independent living facility, grocery shopping, going to church,etc.    Baseline 09/26/2020= 100 feet in 1 min using 4WW- stopped due to fatigue. 11/10/2020= 555 feet with 4WW in 4 min 28 sec.  01/25/2021= 250 feet in 2 min 30 sec using 4WW- stopped due to fatigue today. 02/15/2021= 200 feet in 1 min 53 sec using 4WW- Patient reports fatigue as well as active Lupus Flare up. 04/27/2021= 280 feet in 1 min 53 sec using 4WW.  05/11/2021= 300 feet in 2 min 38 sec using 4WW; 06/13/2021 = 358 feet in 3 min 45 sec using 4WW    Time 12    Period Weeks    Status On-going    Target Date 08/03/21                   Plan - 06/15/21 0909     Clinical Impression Statement Patient presented with good motivation and demonstrated progress with activity level - able to complete 4 cycles of exercises with decreased overall rest breaks and good ability to complete despite becoming fatigued. She was knowledgeable of pursed lip breathing and use of RPE Scale and able to use both without prompting today. She will benefit from further skilled therapy to improve LE strength and to improve endurance/functional capacity to increase quality of life    Personal Factors and Comorbidities Age;Comorbidity 3+    Comorbidities TIA, aphasia, acute bronchitis    Examination-Activity Limitations Stairs;Squat;Bend;Stand;Transfers;Lift;Caring for Others;Carry    Examination-Participation Restrictions Cleaning;Community Activity;Driving;Shop;Yard Work    Conservation officer, historic buildings Evolving/Moderate complexity    Rehab  Potential Fair  PT Frequency 2x / week    PT Duration 12 weeks    PT Treatment/Interventions ADLs/Self Care Home Management;Canalith Repostioning;Moist Heat;Traction;Electrical Stimulation;Gait training;Stair training;Functional mobility training;Therapeutic activities;Therapeutic exercise;Balance training;Neuromuscular re-education;Patient/family education;Passive range of motion;Energy conservation;Cryotherapy;DME Instruction;Manual techniques;Dry needling;Joint Manipulations    PT Next Visit Plan progress strengthening and balance exercises, progress cardiovascular endurance training; progress standing therex, progress volume of distance amb.    PT Home Exercise Plan No updates today, Encouraged walking to dining hall everyday as able.    Consulted and Agree with Plan of Care Patient    Family Member Consulted daughter             Patient will benefit from skilled therapeutic intervention in order to improve the following deficits and impairments:  Abnormal gait, Decreased endurance, Cardiopulmonary status limiting activity, Decreased activity tolerance, Decreased strength, Decreased balance, Decreased mobility, Difficulty walking, Improper body mechanics, Decreased safety awareness, Postural dysfunction  Visit Diagnosis: Abnormality of gait and mobility  Difficulty in walking, not elsewhere classified  Muscle weakness (generalized)  Unsteadiness on feet     Problem List Patient Active Problem List   Diagnosis Date Noted   TIA (transient ischemic attack) 02/03/2020   Aphasia    Acute bronchitis 10/06/2016    Lenda Kelp, PT 06/15/2021, 1:36 PM  [facility] MAIN [facility] SERVICES [address] [place], [place], [address] Phone: [phone]   Fax:  [phone]  Name: Kara Pennington MRN: [id number] Date of Birth: 15-Jun-1941

## 2021-06-20 ENCOUNTER — Ambulatory Visit: Payer: Medicare Other

## 2021-06-22 ENCOUNTER — Other Ambulatory Visit: Payer: Self-pay

## 2021-06-22 ENCOUNTER — Ambulatory Visit: Payer: Medicare Other

## 2021-06-22 DIAGNOSIS — M6281 Muscle weakness (generalized): Secondary | ICD-10-CM

## 2021-06-22 DIAGNOSIS — R269 Unspecified abnormalities of gait and mobility: Secondary | ICD-10-CM | POA: Diagnosis not present

## 2021-06-22 DIAGNOSIS — R2681 Unsteadiness on feet: Secondary | ICD-10-CM

## 2021-06-22 DIAGNOSIS — R262 Difficulty in walking, not elsewhere classified: Secondary | ICD-10-CM

## 2021-06-22 NOTE — Therapy (Signed)
[place] MAIN [facility] SERVICES [address] [place], [place], [address] Phone: [phone]   Fax:  [phone]  Physical Therapy Treatment  Patient Details  Name: Kara Pennington MRN: [id number] Date of Birth: 11/27/1940 Referring Provider (PT): Dr. Lanny Cramp   Encounter Date: 06/22/2021   PT End of Session - 06/22/21 0947     Visit Number 52    Number of Visits 56    Date for PT Re-Evaluation 08/03/21    Authorization Type Medicare Primary; [employer] Secondary    Authorization Time Period Recert 02/15/2021-05/10/2021; 05/11/2021- 08/03/2021    Progress Note Due on Visit 60    PT Start Time 0849    PT Stop Time 0922    PT Time Calculation (min) 33 min    Equipment Utilized During Treatment Gait belt    Activity Tolerance Patient tolerated treatment well;Patient limited by fatigue;No increased pain    Behavior During Therapy WFL for tasks assessed/performed             Past Medical History:  Diagnosis Date   Anxiety    Depression    GERD (gastroesophageal reflux disease)    Hypertension     Past Surgical History:  Procedure Laterality Date   APPENDECTOMY     PARATHYROIDECTOMY      There were no vitals filed for this visit.      INTERVENTIONS:   Therapeutic Exercises: at Support bar unless otherwise stated with CGA using gait belt.  Standing march interval - Performed for 45 sec and standing rest x 30 sec x 2 sets prior to rest break.    Standing at support bar and Overhead raise of rainbow ball for erect posture/upper back ext. X 15 reps- followed by 45 sec static stand   -Standing scap rows with GTB x 15 reps- followed by 30 sec static stand- Patient required rest break due to fatigue. "I feel like my legs are just gonna melt underneath me."   - Standing hip flex/abd over hedgehog x 12 reps followed by 30 sec static standing   -1 min 20 sec ball toss (with assist of SPT tossing ball to patient) and CGA followed  by 30 sec static stand.  -175 feet ambulation with 4WW- focusing on standing as erect as possible.   PRE measured and never exceeded more than 6/10 throughout session. HR maintained at 90bpm or less.   Pt educated throughout session about proper posture and technique with exercises. Improved exercise technique, movement at target joints, use of target muscles after min to mod verbal, visual, tactile cues.   Patient presented today with good motivation for all standing activities. She was able to progress with with improved overall standing endurance today - able to complete several activities plus extended time just static standing focusing on her functional endurance. She will benefit from further skilled therapy to improve LE strength and to improve endurance/functional capacity to increase quality of life                  PT Short Term Goals - 11/10/20 1324       PT SHORT TERM GOAL #1   Title Patient will be independent in home exercise program to improve strength/mobility for better functional independence with ADLs.    Baseline 09/26/2020- Patient requires cues for Breathing techniques with walking and reminders to use the BORG RPE scale. She is knowledgeable of seated LE strengthening but will continue to benefit from progression of HEP to more  progressive exercises. 11/10/2020- Patient able to verbalize and demo good working knowledge of breathing technqiues, BORG Scale, and seated/standing therex along with walking program with no questions or concerns at this time.    Time 6    Period Weeks    Status Achieved    Target Date 09/27/20               PT Long Term Goals - 06/13/21 0956       PT LONG TERM GOAL #1   Title Patient will increase FOTO score to equal to or greater than to 61 demonstrate statistically significant improvement in mobility and quality of life.    Baseline 02/08: 45; 11/10/2020= 49; 02/15/2021=59%; 05/11/2021= 51%; 06/13/2021= 60%    Time 12     Period Weeks    Status On-going    Target Date 08/03/21      PT LONG TERM GOAL #2   Title Patient (> 61 years old) will complete five times sit to stand test in < 15 seconds indicating an increased LE strength and improved balance.    Baseline 02/08: 19s without BUE support. 09/26/2020= 16.32 sec without UE support. 11/10/2020= 17.03 sec without UE support.  01/25/2021- 13.87 sec without UE Support- will keep goal active to ensure patient remains consistent. 02/15/2021= 11.8 sec without UE support.    Time 12    Period Weeks    Status Achieved      PT LONG TERM GOAL #3   Title Patient will increase 10 meter walk test to >1.72m/s as to improve gait speed for better community ambulation and to reduce fall risk.    Baseline 02/08:0.37 m/s. 09/26/2020= 0.59 m/s using 4WW. 11/10/2020= 0.75 m/s using 4WW. 01/25/2021= 0.68 m/s using 4WW. 02/15/2021= 0.71 m/s using 4WW; 04/27/2021= 0.91 m/s using 4WW; 05/11/2021= 0.8 m/s; 06/13/2021= 0.75 m/s using 4WW    Time 12    Period Weeks    Status On-going    Target Date 08/03/21      PT LONG TERM GOAL #4   Title Patient will reduce timed up and go to <11 seconds to reduce fall risk and demonstrate improved transfer/gait ability.    Baseline 02/08: 33.43 with 2WW. 09/26/2020= 22.18 sec with 3XT; 11/10/2020=19.33 with 0WI. 01/25/2021= 16.16 sec using 4WW. 02/15/2021- Will assess next visit; 04/20/2021= 15.11 using 4WW; 05/11/2021= TUG avg of 2 tests using 4WW= 15.1 sec; 06/13/2021= TUG avg of 2 tests using 4WW = 15.99 sec    Time 12    Period Weeks    Status On-going    Target Date 08/03/21      PT LONG TERM GOAL #5   Title Patient will increase ABC scale score >80% to demonstrate better functional mobility and better confidence with ADLs.    Baseline 02/08: 48.25%; 11/10/2020= 53.13. 02/15/2021=56.25 04/27/2021= 70%; 05/11/2021= 72.50%; 06/13/2021= 68.75%    Time 12    Period Weeks    Status On-going    Target Date 08/03/21      PT LONG TERM GOAL #6   Title Patient will  demonstrate improved 6 min walk test from only able to complete 100 feet in  prior to stoppage from fatigue to completing test at 550 feet or more without rest break, reporting some difficulty or less to improve walking tolerance with community ambulation including walking to dinining hall at independent living facility, grocery shopping, going to church,etc.    Baseline 09/26/2020= 100 feet in 1 min using 4WW- stopped due to fatigue. 11/10/2020= 555  feet with 4WW in 4 min 28 sec.  01/25/2021= 250 feet in 2 min 30 sec using 4WW- stopped due to fatigue today. 02/15/2021= 200 feet in 1 min 53 sec using 4WW- Patient reports fatigue as well as active Lupus Flare up. 04/27/2021= 280 feet in 1 min 53 sec using 4WW.  05/11/2021= 300 feet in 2 min 38 sec using 4WW; 06/13/2021 = 358 feet in 3 min 45 sec using 4WW    Time 12    Period Weeks    Status On-going    Target Date 08/03/21                   Plan - 06/22/21 0946     Clinical Impression Statement Patient presented today with good motivation for all standing activities. She was able to progress with with improved overall standing endurance today - able to complete several activities plus extended time just static standing focusing on her functional endurance. She will benefit from further skilled therapy to improve LE strength and to improve endurance/functional capacity to increase quality of life    Personal Factors and Comorbidities Age;Comorbidity 3+    Comorbidities TIA, aphasia, acute bronchitis    Examination-Activity Limitations Stairs;Squat;Bend;Stand;Transfers;Lift;Caring for Others;Carry    Examination-Participation Restrictions Cleaning;Community Activity;Driving;Shop;Yard Work    Stability/Clinical Decision Making Evolving/Moderate complexity    Rehab Potential Fair    PT Frequency 2x / week    PT Duration 12 weeks    PT Treatment/Interventions ADLs/Self Care Home Management;Canalith Repostioning;Moist Heat;Traction;Electrical  Stimulation;Gait training;Stair training;Functional mobility training;Therapeutic activities;Therapeutic exercise;Balance training;Neuromuscular re-education;Patient/family education;Passive range of motion;Energy conservation;Cryotherapy;DME Instruction;Manual techniques;Dry needling;Joint Manipulations    PT Next Visit Plan progress strengthening and balance exercises, progress cardiovascular endurance training; progress standing therex, progress volume of distance amb.    PT Home Exercise Plan No updates today, Encouraged walking to dining hall everyday as able.    Consulted and Agree with Plan of Care Patient    Family Member Consulted daughter             Patient will benefit from skilled therapeutic intervention in order to improve the following deficits and impairments:  Abnormal gait, Decreased endurance, Cardiopulmonary status limiting activity, Decreased activity tolerance, Decreased strength, Decreased balance, Decreased mobility, Difficulty walking, Improper body mechanics, Decreased safety awareness, Postural dysfunction  Visit Diagnosis: Abnormality of gait and mobility  Difficulty in walking, not elsewhere classified  Muscle weakness (generalized)  Unsteadiness on feet     Problem List Patient Active Problem List   Diagnosis Date Noted   TIA (transient ischemic attack) 02/03/2020   Aphasia    Acute bronchitis 10/06/2016    Lenda Kelp, PT 06/22/2021, 9:48 AM  [facility] MAIN [facility] SERVICES [address] [place], [place], [address] Phone: [phone]   Fax:  [phone]  Name: Kara Pennington MRN: [id number] Date of Birth: 03-12-1941

## 2021-06-27 ENCOUNTER — Ambulatory Visit: Payer: Medicare Other

## 2021-07-06 ENCOUNTER — Ambulatory Visit: Payer: Medicare Other

## 2021-07-13 ENCOUNTER — Ambulatory Visit: Payer: Medicare Other | Attending: Physical Medicine and Rehabilitation

## 2021-07-13 ENCOUNTER — Other Ambulatory Visit: Payer: Self-pay

## 2021-07-13 DIAGNOSIS — M6281 Muscle weakness (generalized): Secondary | ICD-10-CM | POA: Insufficient documentation

## 2021-07-13 DIAGNOSIS — R262 Difficulty in walking, not elsewhere classified: Secondary | ICD-10-CM | POA: Insufficient documentation

## 2021-07-13 DIAGNOSIS — R269 Unspecified abnormalities of gait and mobility: Secondary | ICD-10-CM | POA: Diagnosis present

## 2021-07-13 DIAGNOSIS — R2681 Unsteadiness on feet: Secondary | ICD-10-CM | POA: Diagnosis present

## 2021-07-13 NOTE — Therapy (Signed)
[place] [facility] [address] [place], [place], [address] Phone: [phone]   Fax:  [phone]  Physical Therapy Treatment  Patient Details  Name: Kara Pennington MRN: [id number] Date of Birth: [birth year] Referring Provider (PT): Dr. Lanny CrampPatrick O'Brien   Encounter Date: 07/13/2021   PT End of Session - 07/13/21 0900     Visit Number 53    Number of Visits 56    Date for PT Re-Evaluation 08/03/21    Authorization Type Medicare Primary; [employer] Secondary    Authorization Time Period Recert 02/15/2021-05/10/2021; 05/11/2021- 08/03/2021    Progress Note Due on Visit 60    PT Start Time 0848    PT Stop Time 0930    PT Time Calculation (min) 42 min    Equipment Utilized During Treatment Gait belt    Activity Tolerance Patient tolerated treatment well;Patient limited by fatigue;No increased pain    Behavior During Therapy WFL for tasks assessed/performed             Past Medical History:  Diagnosis Date   Anxiety    Depression    GERD (gastroesophageal reflux disease)    Hypertension     Past Surgical History:  Procedure Laterality Date   APPENDECTOMY     PARATHYROIDECTOMY      There were no vitals filed for this visit.   Subjective Assessment - 07/13/21 0858     Subjective Paient reports feeling pretty good today- states she went to her MD and has to take an OT test for driving and then may be cleared for driving so she is hopeful    Patient is accompained by: Family member    Pertinent History 81 y.o. female presents to skilled physical therapy with a history of COVID-19 infection 1 year ago, hypothyroidism, and fall on 07/15/2020 with resultant nondisplaced right temporal bone fracture with associated minimal left parietal subarachnoid hemorrhage and a resolved right frontotemporal subdural hematoma who [facility] with intermitten but worsening word finding difficulties and expressive  aphasia. While in the hospital, she had approximately 24 hours of speaking "in gibberish" and unable to speak. After about 1 day, it was much improved and she was [facility]. She normally lives in an independent living apartment. During the week, daughter stated that she was overall doing better though she still would mix up words. On the day of admit, the daughter spoke to her at around noon and she said they had a "normal" conversation. [facility] at [facility]:15 and immediately felt that her mother was not well. She was back to speaking non-sensically. D[facility] Upon arrival she was able to follow commands and answer some questions accurately, however, she was having considerable word finding difficulties and seemed somewhat frustrated. 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    Limitations Standing;Walking    How long can you sit comfortably? unlimited time    How long can you stand comfortably? less than 5 mins    How long can you walk comfortably? less than 5 mins    Patient Stated Goals "I want to be able to get back to church and rejoin the knitting club"    Currently in Pain? No/denies              INTERVENTIONS:    Therapeutic Exercises:   INTERVAL TRAINING - NUSTEP BUE/BLE -  seat level 6/arm level 6- 2 min at level 0 and 1 min at level 4- repeat for 7 min total- O2 sat= 98% and HR= 92 bpm   1st round -Knee ext with 2.5 lb AW and  3 sec hold x 10 reps -Shoulder depression (push up from chair- clear bottom off seat) x 10 reps.   -ambulation 80 feet with 4WW and using 2.5 lb AW BLE (O2 sat = 98%; HR=90 bpm) Patient reports 8/10 on RPE     2nd round -Seated Hip march BLE with 2.5 AW- Hold 2 sec x 10 reps each -Horizontal Shoulder abd x 12 reps using red theraband -ambulation 80 feet with 4WW and 2.5 AW BLE (O2 sat= 96%; HR=93 bpm)  RPE= 8/10    3rd round -hip abd up/over cone on floor -Standing lumbar flex into Ext x 10 reps -ambulation 80 feet with 4WW without AW  O2 sat =96 % HR= 102 bpm RPE= 8/10   4th round  - Sit to stand x 10 x reps - Bicep curl 3lb. X 10 reps -Simulated gas/brake pedal exercise working on reflexes/timing. Patient performed well without difficulty and able to respond appropriate to cues to press gas and brake at varying frequencies.   Education provided throughout session via VC/TC and demonstration to facilitate movement at target joints and correct muscle activation for all testing and exercises performed.                                   PT Education - 07/13/21 0859     Education Details Exercise technique    Person(s) Educated Patient    Methods Explanation;Demonstration;Tactile cues;Verbal cues;Handout    Comprehension Verbalized understanding;Returned demonstration;Verbal cues required;Tactile cues required;Need further instruction              PT Short Term Goals - 11/10/20 2683       PT SHORT TERM GOAL #1   Title Patient will be independent in home exercise program to improve strength/mobility for better functional independence with ADLs.    Baseline 09/26/2020- Patient requires cues for Breathing techniques with walking and reminders to use the BORG RPE scale. She is knowledgeable of seated LE strengthening but will continue to benefit from progression of HEP to more progressive exercises. 11/10/2020- Patient able to verbalize and demo good working knowledge of breathing technqiues, BORG Scale, and seated/standing therex along with walking program with no questions or concerns at this time.    Time 6    Period Weeks    Status Achieved    Target Date 09/27/20               PT Long Term Goals - 06/13/21 0956       PT LONG TERM GOAL #1   Title Patient will increase FOTO score to equal to or greater than to 61 demonstrate statistically significant  improvement in mobility and quality of life.    Baseline 02/08: 45; 11/10/2020= 49; 02/15/2021=59%; 05/11/2021= 51%; 06/13/2021= 60%    Time 12    Period Weeks    Status On-going    Target Date 08/03/21      PT LONG TERM GOAL #2   Title Patient (> 3 years old) will complete five times sit to stand test in < 15 seconds indicating an increased LE strength and improved balance.    Baseline 02/08: 19s without BUE support. 09/26/2020= 16.32 sec without UE support.  11/10/2020= 17.03 sec without UE support.  01/25/2021- 13.87 sec without UE Support- will keep goal active to ensure patient remains consistent. 02/15/2021= 11.8 sec without UE support.    Time 12    Period Weeks    Status Achieved      PT LONG TERM GOAL #3   Title Patient will increase 10 meter walk test to >1.6941m/s as to improve gait speed for better community ambulation and to reduce fall risk.    Baseline 02/08:0.37 m/s. 09/26/2020= 0.59 m/s using 4WW. 11/10/2020= 0.75 m/s using 4WW. 01/25/2021= 0.68 m/s using 4WW. 02/15/2021= 0.71 m/s using 4WW; 04/27/2021= 0.91 m/s using 4WW; 05/11/2021= 0.8 m/s; 06/13/2021= 0.75 m/s using 4WW    Time 12    Period Weeks    Status On-going    Target Date 08/03/21      PT LONG TERM GOAL #4   Title Patient will reduce timed up and go to <11 seconds to reduce fall risk and demonstrate improved transfer/gait ability.    Baseline 02/08: 33.43 with 2WW. 09/26/2020= 22.18 sec with 1OX4WW; 11/10/2020=19.33 with 0RU4WW. 01/25/2021= 16.16 sec using 4WW. 02/15/2021- Will assess next visit; 04/20/2021= 15.11 using 4WW; 05/11/2021= TUG avg of 2 tests using 4WW= 15.1 sec; 06/13/2021= TUG avg of 2 tests using 4WW = 15.99 sec    Time 12    Period Weeks    Status On-going    Target Date 08/03/21      PT LONG TERM GOAL #5   Title Patient will increase ABC scale score >80% to demonstrate better functional mobility and better confidence with ADLs.    Baseline 02/08: 48.25%; 11/10/2020= 53.13. 02/15/2021=56.25 04/27/2021= 70%; 05/11/2021=  72.50%; 06/13/2021= 68.75%    Time 12    Period Weeks    Status On-going    Target Date 08/03/21      PT LONG TERM GOAL #6   Title Patient will demonstrate improved 6 min walk test from only able to complete 100 feet in  1min prior to stoppage from fatigue to completing test at 550 feet or more without rest break, reporting some difficulty or less to improve walking tolerance with community ambulation including walking to dinining hall at independent living facility, grocery shopping, going to church,etc.    Baseline 09/26/2020= 100 feet in 1 min using 4WW- stopped due to fatigue. 11/10/2020= 555 feet with 4WW in 4 min 28 sec.  01/25/2021= 250 feet in 2 min 30 sec using 4WW- stopped due to fatigue today. 02/15/2021= 200 feet in 1 min 53 sec using 4WW- Patient reports fatigue as well as active Lupus Flare up. 04/27/2021= 280 feet in 1 min 53 sec using 4WW.  05/11/2021= 300 feet in 2 min 38 sec using 4WW; 06/13/2021 = 358 feet in 3 min 45 sec using 4WW    Time 12    Period Weeks    Status On-going    Target Date 08/03/21                   Plan - 07/13/21 0901     Clinical Impression Statement Patient presented with excellent motivation for today's session. She was responsive to interval training and circuit style workout - able to walk with resistance today. She was fatigued and exhibited higher RPE rating with added resistance to workout. Patient will benefit from further skilled therapy to improve LE strength and to improve endurance/functional capacity to increase quality of life    Personal Factors and Comorbidities Age;Comorbidity 3+    Comorbidities TIA,  aphasia, acute bronchitis    Examination-Activity Limitations Stairs;Squat;Bend;Stand;Transfers;Lift;Caring for Others;Carry    Examination-Participation Restrictions Cleaning;Community Activity;Driving;Shop;Yard Work    Stability/Clinical Decision Making Evolving/Moderate complexity    Rehab Potential Fair    PT Frequency 2x / week     PT Duration 12 weeks    PT Treatment/Interventions ADLs/Self Care Home Management;Canalith Repostioning;Moist Heat;Traction;Electrical Stimulation;Gait training;Stair training;Functional mobility training;Therapeutic activities;Therapeutic exercise;Balance training;Neuromuscular re-education;Patient/family education;Passive range of motion;Energy conservation;Cryotherapy;DME Instruction;Manual techniques;Dry needling;Joint Manipulations    PT Next Visit Plan progress strengthening and balance exercises, progress cardiovascular endurance training; progress standing therex, progress volume of distance amb.    PT Home Exercise Plan No updates today, Encouraged walking to dining hall everyday as able.    Consulted and Agree with Plan of Care Patient    Family Member Consulted daughter             Patient will benefit from skilled therapeutic intervention in order to improve the following deficits and impairments:  Abnormal gait, Decreased endurance, Cardiopulmonary status limiting activity, Decreased activity tolerance, Decreased strength, Decreased balance, Decreased mobility, Difficulty walking, Improper body mechanics, Decreased safety awareness, Postural dysfunction  Visit Diagnosis: Abnormality of gait and mobility  Difficulty in walking, not elsewhere classified  Muscle weakness (generalized)  Unsteadiness on feet     Problem List Patient Active Problem List   Diagnosis Date Noted   TIA (transient ischemic attack) 02/03/2020   Aphasia    Acute bronchitis 10/06/2016    Kara Pennington, PT 07/13/2021, 10:09 AM  [place] [facility] MAIN [facility] SERVICES [address] [place], [place], [address] Phone: [phone]   Fax:  [phone]  Name: Kara Pennington MRN: [id number] Date of Birth: 1941/02/05

## 2021-07-21 ENCOUNTER — Other Ambulatory Visit: Payer: Self-pay

## 2021-07-21 ENCOUNTER — Ambulatory Visit: Payer: Medicare Other

## 2021-07-21 DIAGNOSIS — M6281 Muscle weakness (generalized): Secondary | ICD-10-CM

## 2021-07-21 DIAGNOSIS — R262 Difficulty in walking, not elsewhere classified: Secondary | ICD-10-CM

## 2021-07-21 DIAGNOSIS — R269 Unspecified abnormalities of gait and mobility: Secondary | ICD-10-CM | POA: Diagnosis not present

## 2021-07-21 DIAGNOSIS — R2681 Unsteadiness on feet: Secondary | ICD-10-CM

## 2021-07-21 NOTE — Therapy (Signed)
[place] [facility] [address] [place], [place], [address] Phone: [phone]   Fax:  [phone]  Physical Therapy Treatment  Patient Details  Name: Kara Pennington MRN: [id number] Date of Birth: 08/27/1940 Referring Provider (PT): Dr. Lanny CrampPatrick O'Brien   Encounter Date: 07/21/2021   PT End of Session - 07/21/21 0935     Visit Number 54    Number of Visits 56    Date for PT Re-Evaluation 08/03/21    Authorization Type Medicare Primary; [employer] Secondary    Authorization Time Period Recert 02/15/2021-05/10/2021; 05/11/2021- 08/03/2021    Progress Note Due on Visit 60    PT Start Time 0846    PT Stop Time 0925    PT Time Calculation (min) 39 min    Equipment Utilized During Treatment Gait belt    Activity Tolerance Patient tolerated treatment well;Patient limited by fatigue;No increased pain    Behavior During Therapy WFL for tasks assessed/performed             Past Medical History:  Diagnosis Date   Anxiety    Depression    GERD (gastroesophageal reflux disease)    Hypertension     Past Surgical History:  Procedure Laterality Date   APPENDECTOMY     PARATHYROIDECTOMY      There were no vitals filed for this visit.   Subjective Assessment - 07/21/21 0933     Subjective Patient reports doing well and has to go through a live driving class soon. States she is exercising some at home.    Patient is accompained by: Family member    Pertinent History 81 y.o. female presents to skilled physical therapy with a history of COVID-19 infection 1 year ago, hypothyroidism, and fall on 07/15/2020 with resultant nondisplaced right temporal bone fracture with associated minimal left parietal subarachnoid hemorrhage and a resolved right frontotemporal subdural hematoma who presented to [facility] on 07[facility]20 with intermitten but worsening word finding difficulties and expressive aphasia. While in the hospital, she had  approximately 24 hours of speaking "in gibberish" and unable to speak. After about 1 day, it was much improved and she was transferred to [facility]. She normally lives in an independent living apartment. During the week, daughter stated that she was overall doing better though she still would mix up words. On the day of admit, the daughter spoke to her at around noon and she said they had a "normal" conversation. [facility] at 2:15 and immediately felt that her mother was not well. She was back to speaking non-sensically. D[facility]. Upon arrival she was able to follow commands and answer some questions accurately, however, she was having considerable word finding difficulties and seemed somewhat frustrated. 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    Limitations Standing;Walking    How long can you sit comfortably? unlimited time    How long can you stand comfortably? less than 5 mins    How long can you walk comfortably? less than 5 mins    Patient Stated Goals "I want to be able to get back to church and rejoin the knitting club"    Currently in Pain? No/denies             INTERVENTIONS:    Therapeutic Exercises:    INTERVAL TRAINING - NUSTEP BUE/BLE - seat level 6/arm level 6- 2 min at level 0 and  1 min at level 4- repeat for 7 min total- O2 sat= 98% and HR= 92 bpm     1st round -Lumbar flex into ext  with arms raised x 12 reps -Shoulder depression (push up from chair- clear bottom off seat) x 12 reps.   -ambulation in // bars without UE support (down and back x2) (O2 sat = 98%; HR=90 bpm) Patient reports 12/16/08 on RPE     2nd round -Standing hip march Hold 2 sec x 10 reps each -Horizontal Shoulder abd x 12 reps using red theraband -ambulation  in // bars without UE support (down and back x2) (O2 sat= 96%; HR=93 bpm)  RPE= 7/10; O2 sat =98% and HR=89 bpm    3rd round -Standing hip abd up/over blue 1/2 foam RPE=7/10 -Standing Scap retraction x 12 reps -ambulation -in // bars without UE support (down and back x 2) O2 sat =93 % HR= 91 bpm RPE= 7/10   4th round  - Sit to stand x 10 x reps - Bicep curl 3lb. X 10 reps -ambulation -in // bars without UE support (down and back x 2) O2 sat =94 % HR= 97 bpm  Education  provided throughout session via VC/TC and demonstration to facilitate movement at target joints and correct muscle activation for all testing and exercises performed.                                  PT Education - 07/21/21 0934     Education Details Exercise technique    Person(s) Educated Patient    Methods Explanation;Demonstration;Verbal cues    Comprehension Verbalized understanding;Returned demonstration              PT Short Term Goals - 11/10/20 0609       PT SHORT TERM GOAL #1   Title Patient will be independent in home exercise program to improve strength/mobility for better functional independence with ADLs.    Baseline 09/26/2020- Patient requires cues for Breathing techniques with walking and reminders to use the BORG RPE scale. She is knowledgeable of seated LE strengthening but will continue to benefit from progression of HEP to more progressive exercises. 11/10/2020- Patient able to verbalize and demo good working knowledge of breathing technqiues, BORG Scale, and seated/standing therex along with walking program with no questions or concerns at this time.    Time 6    Period Weeks    Status Achieved    Target Date 09/27/20               PT Long Term Goals - 06/13/21 0956       PT LONG TERM GOAL #1   Title Patient will increase FOTO score to equal to or greater than to 61 demonstrate statistically significant improvement in mobility and quality of life.    Baseline 02/08: 45; 11/10/2020= 49; 02/15/2021=59%; 05/11/2021= 51%; 06/13/2021= 60%    Time 12    Period Weeks     Status On-going    Target Date 08/03/21      PT LONG TERM GOAL #2   Title Patient (> 35 years old) will complete five times sit to stand test in < 15 seconds indicating an increased LE strength and improved balance.    Baseline 02/08: 19s without BUE support. 09/26/2020= 16.32 sec without UE support. 11/10/2020= 17.03 sec without UE support.  01/25/2021- 13.87 sec without UE Support- will keep goal active to ensure  patient remains consistent. 02/15/2021= 11.8 sec without UE support.    Time 12    Period Weeks    Status Achieved      PT LONG TERM GOAL #3   Title Patient will increase 10 meter walk test to >1.7310m/s as to improve gait speed for better community ambulation and to reduce fall risk.    Baseline 02/08:0.37 m/s. 09/26/2020= 0.59 m/s using 4WW. 11/10/2020= 0.75 m/s using 4WW. 01/25/2021= 0.68 m/s using 4WW. 02/15/2021= 0.71 m/s using 4WW; 04/27/2021= 0.91 m/s using 4WW; 05/11/2021= 0.8 m/s; 06/13/2021= 0.75 m/s using 4WW    Time 12    Period Weeks    Status On-going    Target Date 08/03/21      PT LONG TERM GOAL #4   Title Patient will reduce timed up and go to <11 seconds to reduce fall risk and demonstrate improved transfer/gait ability.    Baseline 02/08: 33.43 with 2WW. 09/26/2020= 22.18 sec with 1OX4WW; 11/10/2020=19.33 with 0RU4WW. 01/25/2021= 16.16 sec using 4WW. 02/15/2021- Will assess next visit; 04/20/2021= 15.11 using 4WW; 05/11/2021= TUG avg of 2 tests using 4WW= 15.1 sec; 06/13/2021= TUG avg of 2 tests using 4WW = 15.99 sec    Time 12    Period Weeks    Status On-going    Target Date 08/03/21      PT LONG TERM GOAL #5   Title Patient will increase ABC scale score >80% to demonstrate better functional mobility and better confidence with ADLs.    Baseline 02/08: 48.25%; 11/10/2020= 53.13. 02/15/2021=56.25 04/27/2021= 70%; 05/11/2021= 72.50%; 06/13/2021= 68.75%    Time 12    Period Weeks    Status On-going    Target Date 08/03/21      PT LONG TERM GOAL #6   Title Patient will demonstrate  improved 6 min walk test from only able to complete 100 feet in  1min prior to stoppage from fatigue to completing test at 550 feet or more without rest break, reporting some difficulty or less to improve walking tolerance with community ambulation including walking to dinining hall at independent living facility, grocery shopping, going to church,etc.    Baseline 09/26/2020= 100 feet in 1 min using 4WW- stopped due to fatigue. 11/10/2020= 555 feet with 4WW in 4 min 28 sec.  01/25/2021= 250 feet in 2 min 30 sec using 4WW- stopped due to fatigue today. 02/15/2021= 200 feet in 1 min 53 sec using 4WW- Patient reports fatigue as well as active Lupus Flare up. 04/27/2021= 280 feet in 1 min 53 sec using 4WW.  05/11/2021= 300 feet in 2 min 38 sec using 4WW; 06/13/2021 = 358 feet in 3 min 45 sec using 4WW    Time 12    Period Weeks    Status On-going    Target Date 08/03/21                   Plan - 07/21/21 0933     Clinical Impression Statement Patient presented with good motivation and continues to push through the circuit style workout- able to correctly use the RPE scale and modify exercises as needed. She continues to exhibit primarily fatigue as limiting factor. Patient has 1 visit remaining and plan will be to discharge to a home program.    Personal Factors and Comorbidities Age;Comorbidity 3+    Comorbidities TIA, aphasia, acute bronchitis    Examination-Activity Limitations Stairs;Squat;Bend;Stand;Transfers;Lift;Caring for Others;Carry    Examination-Participation Restrictions Cleaning;Community Activity;Driving;Shop;Yard Work    Stability/Clinical Decision Making Evolving/Moderate complexity  Rehab Potential Fair    PT Frequency 2x / week    PT Duration 12 weeks    PT Treatment/Interventions ADLs/Self Care Home Management;Canalith Repostioning;Moist Heat;Traction;Electrical Stimulation;Gait training;Stair training;Functional mobility training;Therapeutic activities;Therapeutic  exercise;Balance training;Neuromuscular re-education;Patient/family education;Passive range of motion;Energy conservation;Cryotherapy;DME Instruction;Manual techniques;Dry needling;Joint Manipulations    PT Next Visit Plan progress strengthening and balance exercises, progress cardiovascular endurance training; progress standing therex, progress volume of distance amb.    PT Home Exercise Plan No updates today, Encouraged walking to dining hall everyday as able.    Consulted and Agree with Plan of Care Patient    Family Member Consulted daughter             Patient will benefit from skilled therapeutic intervention in order to improve the following deficits and impairments:  Abnormal gait, Decreased endurance, Cardiopulmonary status limiting activity, Decreased activity tolerance, Decreased strength, Decreased balance, Decreased mobility, Difficulty walking, Improper body mechanics, Decreased safety awareness, Postural dysfunction  Visit Diagnosis: Abnormality of gait and mobility  Difficulty in walking, not elsewhere classified  Muscle weakness (generalized)  Unsteadiness on feet     Problem List Patient Active Problem List   Diagnosis Date Noted   TIA (transient ischemic attack) 02/03/2020   Aphasia    Acute bronchitis 10/06/2016    Lenda Kelp, PT 07/21/2021, 3:44 PM  [place] [facility] MAIN [facility] SERVICES [address] [place], [place], [address] Phone: [phone]   Fax:  [phone]  Name: Kara Pennington MRN: [id number] Date of Birth: [birth year]

## 2021-07-28 ENCOUNTER — Other Ambulatory Visit: Payer: Self-pay

## 2021-07-28 ENCOUNTER — Ambulatory Visit: Payer: Medicare Other

## 2021-07-28 DIAGNOSIS — R2681 Unsteadiness on feet: Secondary | ICD-10-CM

## 2021-07-28 DIAGNOSIS — M6281 Muscle weakness (generalized): Secondary | ICD-10-CM

## 2021-07-28 DIAGNOSIS — R269 Unspecified abnormalities of gait and mobility: Secondary | ICD-10-CM

## 2021-07-28 DIAGNOSIS — R262 Difficulty in walking, not elsewhere classified: Secondary | ICD-10-CM

## 2021-07-28 NOTE — Therapy (Signed)
[place] MAIN [facility] SERVICES [address] [place], [place], [address] Phone: [phone]   Fax:  [phone]  Physical Therapy Treatment/Discharge Summary  Patient Details  Name: Kara Pennington MRN: [id number] Date of Birth: [birth year] Referring Provider (PT): Dr. Doran Clay   Encounter Date: 07/28/2021   PT End of Session - 07/28/21 0856     Visit Number 61    Number of Visits 35    Date for PT Re-Evaluation 08/03/21    Authorization Type Medicare Primary; [employer] Secondary    Authorization Time Period Recert 1/88/4166-01/06/1600; 05/11/2021- 08/03/2021    Progress Note Due on Visit 60    PT Start Time 0846    PT Stop Time 0928    PT Time Calculation (min) 42 min    Equipment Utilized During Treatment Gait belt    Activity Tolerance Patient tolerated treatment well;Patient limited by fatigue;No increased pain    Behavior During Therapy WFL for tasks assessed/performed             Past Medical History:  Diagnosis Date   Anxiety    Depression    GERD (gastroesophageal reflux disease)    Hypertension     Past Surgical History:  Procedure Laterality Date   APPENDECTOMY     PARATHYROIDECTOMY      There were no vitals filed for this visit.   Subjective Assessment - 07/28/21 0855     Subjective Patient reports she was sick for 3 days but feeling much better today.    Patient is accompained by: Family member    Pertinent History 81 y.o. female presents to skilled physical therapy with a history of COVID-19 infection 1 year ago, hypothyroidism, and fall on 07/15/2020 with resultant nondisplaced right temporal bone fracture with associated minimal left parietal subarachnoid hemorrhage and a resolved right frontotemporal subdural hematoma who presented to [place] on 07/23/20 with intermitten but worsening word finding difficulties and expressive aphasia. While in the hospital, she had approximately 24 hours of speaking  "in gibberish" and unable to speak. After about 1 day, it was much improved and she was transferred to [facility]. She normally lives in an independent living apartment. During the week, daughter stated that she was overall doing better though she still would mix up words. On the day of admit, the daughter spoke to her at around noon and she said they had a "normal" conversation. She arrived at [facility] to visit with her at 2:15 and immediately felt that her mother was not well. She was back to speaking non-sensically. Daughter requested return to [facility]. Upon arrival she was able to follow commands and answer some questions accurately, however, she was having considerable word finding difficulties and seemed somewhat frustrated. 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    Limitations Standing;Walking    How long can you sit comfortably? unlimited time    How long can you stand comfortably? less than 5 mins    How long can you walk comfortably? less than 5 mins    Patient Stated Goals "I want to be able to get back to church and rejoin the knitting club"    Currently in Pain? No/denies            Reassessed all LTG and reviewed Home program  HEP: Discussed both sitting/standing LE strengthening; postural strengthening and emphasized importance of walking.   FOTO:  58% (overall improved from initial 45) but shy of  goal  10 MWT:  12.5 sec and 12.59 sec (Avg walking speed =0.8 m/s) (improved from most = 0.75 m/s and initial of 0.37 m/s)  TUG: 14.81 sec with use of 4WW (improved from 15.99 sec with use of 4WW and far improved from initial time of 33.43 sec but shy of goal of 11 sec)   ABC scale: 76.23 (improved from 68.75)  6  min Walk test:  300 feet in 3 min using 4WW  (358 feet in 3 min 45 sec)                            PT Education - 07/28/21 0855     Education Details Home program details     Person(s) Educated Patient    Methods Explanation;Demonstration;Tactile cues;Verbal cues    Comprehension Verbalized understanding;Tactile cues required;Verbal cues required;Returned demonstration;Need further instruction              PT Short Term Goals - 11/10/20 7628       PT SHORT TERM GOAL #1   Title Patient will be independent in home exercise program to improve strength/mobility for better functional independence with ADLs.    Baseline 09/26/2020- Patient requires cues for Breathing techniques with walking and reminders to use the BORG RPE scale. She is knowledgeable of seated LE strengthening but will continue to benefit from progression of HEP to more progressive exercises. 11/10/2020- Patient able to verbalize and demo good working knowledge of breathing technqiues, BORG Scale, and seated/standing therex along with walking program with no questions or concerns at this time.    Time 6    Period Weeks    Status Achieved    Target Date 09/27/20               PT Long Term Goals - 07/28/21 1136       PT LONG TERM GOAL #1   Title Patient will increase FOTO score to equal to or greater than to 61 demonstrate statistically significant improvement in mobility and quality of life.    Baseline 02/08: 45; 11/10/2020= 49; 02/15/2021=59%; 05/11/2021= 51%; 06/13/2021= 60%; 07/28/2021=    Time 12    Period Weeks    Status Partially Met    Target Date 08/03/21      PT LONG TERM GOAL #2   Title Patient (> 48 years old) will complete five times sit to stand test in < 15 seconds indicating an increased LE strength and improved balance.    Baseline 02/08: 19s without BUE support. 09/26/2020= 16.32 sec without UE support. 11/10/2020= 17.03 sec without UE support.  01/25/2021- 13.87 sec without UE Support- will keep goal active to ensure patient remains consistent. 02/15/2021= 11.8 sec without UE support.    Time 12    Period Weeks    Status Achieved      PT LONG TERM GOAL #3   Title Patient will  increase 10 meter walk test to >1.42ms as to improve gait speed for better community ambulation and to reduce fall risk.    Baseline 02/08:0.37 m/s. 09/26/2020= 0.59 m/s using 4WW. 11/10/2020= 0.75 m/s using 4WW. 01/25/2021= 0.68 m/s using 4WW. 02/15/2021= 0.71 m/s using 4WW; 04/27/2021= 0.91 m/s using 4WW; 05/11/2021= 0.8 m/s; 06/13/2021= 0.75 m/s using 4WW; 1/20/203= 0.8 m/s    Time 12    Period Weeks    Status Partially Met    Target Date 08/03/21      PT LONG TERM GOAL #4  Title Patient will reduce timed up and go to <11 seconds to reduce fall risk and demonstrate improved transfer/gait ability.    Baseline 02/08: 33.43 with 2WW. 09/26/2020= 22.18 sec with 1BJ; 11/10/2020=19.33 with 4NW. 01/25/2021= 16.16 sec using 4WW. 02/15/2021- Will assess next visit; 04/20/2021= 15.11 using 4WW; 05/11/2021= TUG avg of 2 tests using 4WW= 15.1 sec; 06/13/2021= TUG avg of 2 tests using 4WW = 15.99 sec; 07/28/2021= 14.81    Time 12    Period Weeks    Status Partially Met    Target Date 08/03/21      PT LONG TERM GOAL #5   Title Patient will increase ABC scale score >80% to demonstrate better functional mobility and better confidence with ADLs.    Baseline 02/08: 48.25%; 11/10/2020= 53.13. 02/15/2021=56.25 04/27/2021= 70%; 05/11/2021= 72.50%; 06/13/2021= 68.75%; 07/28/2021= 76.23    Time 12    Period Weeks    Status Partially Met    Target Date 08/03/21      PT LONG TERM GOAL #6   Title Patient will demonstrate improved 6 min walk test from only able to complete 100 feet in  72mn prior to stoppage from fatigue to completing test at 550 feet or more without rest break, reporting some difficulty or less to improve walking tolerance with community ambulation including walking to dinining hall at independent living facility, grocery shopping, going to church,etc.    Baseline 09/26/2020= 100 feet in 1 min using 4WW- stopped due to fatigue. 11/10/2020= 555 feet with 4WW in 4 min 28 sec.  01/25/2021= 250 feet in 2 min 30 sec using  4WW- stopped due to fatigue today. 02/15/2021= 200 feet in 1 min 53 sec using 4WW- Patient reports fatigue as well as active Lupus Flare up. 04/27/2021= 280 feet in 1 min 53 sec using 4WW.  05/11/2021= 300 feet in 2 min 38 sec using 4WW; 06/13/2021 = 358 feet in 3 min 45 sec using 4WW; 07/28/2021=300 feet in 3 min using 4WW    Time 12    Period Weeks    Status Not Met    Target Date 08/03/21                   Plan - 07/28/21 0856     Clinical Impression Statement Patient presents today with good motivation but reports was sick earlier this week. She presents with good working knowledge of her home program and ready to transition to self care. Retested several more functional goals including TUG, FOTO, ABC scale, 10 MWT, 6 min walk test in which patient did improve overall from her initial eval back in Feb of 2022 and goals were partially met. She continues to be most limited by decreased functional endurance and discussed at length the need to walk more at home versus pushing herself in her wheelchair. She is appropriate for discharge today with plan for her to continue on her own. Patient in agreement with plan to discharge today.    Personal Factors and Comorbidities Age;Comorbidity 3+    Comorbidities TIA, aphasia, acute bronchitis    Examination-Activity Limitations Stairs;Squat;Bend;Stand;Transfers;Lift;Caring for Others;Carry    Examination-Participation Restrictions Cleaning;Community Activity;Driving;Shop;Yard Work    Stability/Clinical Decision Making Evolving/Moderate complexity    Rehab Potential Fair    PT Frequency 2x / week    PT Duration 12 weeks    PT Treatment/Interventions ADLs/Self Care Home Management;Canalith Repostioning;Moist Heat;Traction;Electrical Stimulation;Gait training;Stair training;Functional mobility training;Therapeutic activities;Therapeutic exercise;Balance training;Neuromuscular re-education;Patient/family education;Passive range of motion;Energy  conservation;Cryotherapy;DME Instruction;Manual techniques;Dry needling;Joint Manipulations  PT Next Visit Plan Discharge today to a home program    PT Home Exercise Plan No updates today, Encouraged walking to dining hall everyday as able.    Consulted and Agree with Plan of Care Patient    Family Member Consulted daughter             Patient will benefit from skilled therapeutic intervention in order to improve the following deficits and impairments:  Abnormal gait, Decreased endurance, Cardiopulmonary status limiting activity, Decreased activity tolerance, Decreased strength, Decreased balance, Decreased mobility, Difficulty walking, Improper body mechanics, Decreased safety awareness, Postural dysfunction  Visit Diagnosis: Abnormality of gait and mobility  Difficulty in walking, not elsewhere classified  Muscle weakness (generalized)  Unsteadiness on feet     Problem List Patient Active Problem List   Diagnosis Date Noted   TIA (transient ischemic attack) 02/03/2020   Aphasia    Acute bronchitis 10/06/2016    Lewis Moccasin, PT 07/28/2021, 11:48 AM  [place] [address] [place], [place], [address] Phone: [phone]   Fax:  [phone]  Name: Kara Pennington MRN: [id number] Date of Birth: 18-Dec-1940

## 2021-08-04 ENCOUNTER — Ambulatory Visit: Payer: Medicare Other

## 2021-08-11 ENCOUNTER — Ambulatory Visit: Payer: Medicare Other

## 2022-07-09 DIAGNOSIS — R42 Dizziness and giddiness: Secondary | ICD-10-CM

## 2022-07-09 HISTORY — DX: Dizziness and giddiness: R42

## 2022-07-10 ENCOUNTER — Emergency Department: Payer: Medicare Other

## 2022-07-10 ENCOUNTER — Other Ambulatory Visit: Payer: Self-pay

## 2022-07-10 DIAGNOSIS — R42 Dizziness and giddiness: Secondary | ICD-10-CM | POA: Diagnosis present

## 2022-07-10 DIAGNOSIS — Z8673 Personal history of transient ischemic attack (TIA), and cerebral infarction without residual deficits: Secondary | ICD-10-CM | POA: Insufficient documentation

## 2022-07-10 DIAGNOSIS — I10 Essential (primary) hypertension: Secondary | ICD-10-CM | POA: Diagnosis not present

## 2022-07-10 LAB — CBC
HCT: 42.9 % (ref 36.0–46.0)
Hemoglobin: 13.9 g/dL (ref 12.0–15.0)
MCH: 31.7 pg (ref 26.0–34.0)
MCHC: 32.4 g/dL (ref 30.0–36.0)
MCV: 97.9 fL (ref 80.0–100.0)
Platelets: 152 10*3/uL (ref 150–400)
RBC: 4.38 MIL/uL (ref 3.87–5.11)
RDW: 14 % (ref 11.5–15.5)
WBC: 8 10*3/uL (ref 4.0–10.5)
nRBC: 0 % (ref 0.0–0.2)

## 2022-07-10 LAB — BASIC METABOLIC PANEL
Anion gap: 10 (ref 5–15)
BUN: 16 mg/dL (ref 8–23)
CO2: 23 mmol/L (ref 22–32)
Calcium: 9.2 mg/dL (ref 8.9–10.3)
Chloride: 106 mmol/L (ref 98–111)
Creatinine, Ser: 1.14 mg/dL — ABNORMAL HIGH (ref 0.44–1.00)
GFR, Estimated: 48 mL/min — ABNORMAL LOW (ref >60)
Glucose, Bld: 108 mg/dL — ABNORMAL HIGH (ref 70–99)
Potassium: 4.3 mmol/L (ref 3.5–5.1)
Sodium: 139 mmol/L (ref 135–145)

## 2022-07-10 LAB — TROPONIN I (HIGH SENSITIVITY)
Troponin I (High Sensitivity): 13 ng/L (ref <18)
Troponin I (High Sensitivity): 14 ng/L (ref <18)

## 2022-07-10 NOTE — ED Triage Notes (Signed)
Pt to ED via [facility] from home. Pt reports dizziness and nausea x2 days. Pt denies CP, SOB or vision changes. Pt reports head injury 2 years ago that felt similar. Pt denies falls or head injuries since.

## 2022-07-10 NOTE — ED Provider Triage Note (Signed)
Emergency Medicine Provider Triage Evaluation Note  Kara Pennington, a 82 y.o. female  was evaluated in triage.  Pt complains of dizziness & vertigo.  Patient with a remote history of the same following a closed head injury 2 years ago, presents today from her facility at [facility], via EMS patient denies any frank syncope or head injury.  Review of Systems  Positive: Dizziness, N Negative: syncope  Physical Exam  BP (!) 153/100   Pulse 79   Temp 98.7 F (37.1 C) (Oral)   Resp 18   SpO2 94%  Gen:   Awake, no distress  NAD Resp:  Normal effort CTA MSK:   Moves extremities without difficulty  Other:    Medical Decision Making  Medically screening exam initiated at 4:36 PM.  Appropriate orders placed.  Kara Pennington was informed that the remainder of the evaluation will be completed by another provider, this initial triage assessment does not replace that evaluation, and the importance of remaining in the ED until their evaluation is complete.  Geriatric patient to the ED for evaluation of sudden onset of dizziness and vertigo this morning at 11 AM patient denies any frank head injury, headache, or syncope.   Melvenia Needles, PA-C 07/10/22 [phone]

## 2022-07-11 ENCOUNTER — Emergency Department: Payer: Medicare Other

## 2022-07-11 ENCOUNTER — Emergency Department
Admission: EM | Admit: 2022-07-11 | Discharge: 2022-07-11 | Disposition: A | Discharge status: Home / Self Care | Payer: Medicare Other | Attending: Emergency Medicine | Admitting: Emergency Medicine

## 2022-07-11 DIAGNOSIS — R42 Dizziness and giddiness: Secondary | ICD-10-CM

## 2022-07-11 MED ORDER — MECLIZINE HCL 12.5 MG PO TABS: 12.5000 mg | ORAL_TABLET | Freq: Three times a day (TID) | ORAL | 0 refills | Status: AC | PRN

## 2022-07-11 MED ORDER — MECLIZINE HCL 25 MG PO TABS
25.0000 mg | ORAL_TABLET | Freq: Once | ORAL | Status: AC
  Administered 2022-07-11: 25 mg via ORAL
  Filled 2022-07-11: qty 1

## 2022-07-11 NOTE — Progress Notes (Signed)
Patient cleaned of incontinent BM and urine; clean chuck and purewick now in place; patient provided emesis bag per request.

## 2022-07-11 NOTE — Discharge Instructions (Signed)
Please seek medical attention for any high fevers, chest pain, shortness of breath, change in behavior, persistent vomiting, bloody stool or any other new or concerning symptoms.  

## 2022-07-11 NOTE — ED Notes (Signed)
EDP at bedside explaining plan to discharge.

## 2022-07-11 NOTE — ED Provider Notes (Signed)
[facility] Provider Note    Event Date/Time   First MD Initiated Contact with Patient 07/11/22 [phone]     (approximate)   History   Dizziness   HPI  Kara Pennington is a 82 y.o. female   Past medical history of TIA, anxiety, depression, GERD, hypertension presents to the emergency department with vertiginous symptoms dizziness while getting up from bed around 11 AM.  No preceding illness, no preceding trauma, no other acute medical complaints.  She tried to get up from her bed 11 AM and felt the room spinning she felt nauseous and vomited once.  She has no pain anywhere.  Had similar symptoms with a concussion 2 years ago.  However this time no trauma.    System denies chest pain, respiratory infectious symptoms, GU complaints, abdominal pain.  History was obtained via patient.  Her son is at bedside as an independent historian who corroborates information given above      Physical Exam   Triage Vital Signs: ED Triage Vitals  Enc Vitals Group     BP 07/10/22 1629 (!) 153/100     Pulse Rate 07/10/22 1629 79     Resp 07/10/22 1629 18     Temp 07/10/22 1629 98.7 F (37.1 C)     Temp Source 07/10/22 1629 Oral     SpO2 07/10/22 1629 94 %     Weight --      Height --      Head Circumference --      Peak Flow --      Pain Score 07/10/22 1630 0     Pain Loc --      Pain Edu? --      Excl. in [place]? --     Most recent vital signs: Vitals:   07/10/22 2144 07/11/22 0328  BP: (!) 156/89 (!) 144/62  Pulse: 78 72  Resp: 18 18  Temp: 97.9 F (36.6 C) 98.2 F (36.8 C)  SpO2: 97% 96%    General: Awake, no distress.  CV:  Good peripheral perfusion.  Resp:  Normal effort.  Abd:  No distention.  Other:  Motor or sensory exam normal, no facial asymmetry, extraocular movement intact, visual acuity intact, no visual cuts, however she becomes vertiginous when trying to sit up or with rapid head movements.  No symptoms at rest.   ED Results / Procedures /  Treatments   Labs (all labs ordered are listed, but only abnormal results are displayed) Labs Reviewed  BASIC METABOLIC PANEL - Abnormal; Notable for the following components:      Result Value   Glucose, Bld 108 (*)    Creatinine, Ser 1.14 (*)    GFR, Estimated 48 (*)    All other components within normal limits  CBC  TROPONIN I (HIGH SENSITIVITY)  TROPONIN I (HIGH SENSITIVITY)  TROPONIN I (HIGH SENSITIVITY)     I reviewed labs and they are notable for cr is 1.14.  Sugar is normal.  Opponent is 13 and 14  EKG  ED ECG REPORT I, Lucillie Garfinkel, the attending physician, personally viewed and interpreted this ECG.   Date: 07/11/2022  EKG Time: 1638  Rate: 76  Rhythm: normal sinus rhythm  Axis: nl  Intervals:none  ST&T Change: No acute ischemic changes    RADIOLOGY I independently reviewed and interpreted CT of the head and see no obvious bleeding or midline shift   PROCEDURES:  Critical Care performed: No  Procedures   MEDICATIONS  ORDERED IN ED: Medications  meclizine (ANTIVERT) tablet 25 mg (has no administration in time range)    IMPRESSION / MDM / ASSESSMENT AND PLAN / ED COURSE  I reviewed the triage vital signs and the nursing notes.                              Differential diagnosis includes, but is not limited to, CVA, peripheral vertigo versus central vertigo, metabolic derangement or dehydration, infection, arrhythmia, ACS    MDM: Patient with vertiginous symptoms only peripheral versus central.  No preceding upper respiratory infectious symptoms to suggest labyrinthitis.  Given her history of TIA/CVA will obtain MRI of the brain to rule out CVA.  Try meclizine for symptomatic improvement.    Patient's presentation is most consistent with acute presentation with potential threat to life or bodily function.       FINAL CLINICAL IMPRESSION(S) / ED DIAGNOSES   Final diagnoses:  Vertigo     Rx / DC Orders   ED Discharge Orders     None         Note:  This document was prepared using Dragon voice recognition software and may include unintentional dictation errors.    Lucillie Garfinkel, MD 07/11/22 [phone]

## 2023-04-04 ENCOUNTER — Ambulatory Visit: Payer: Medicare Other | Attending: Internal Medicine | Admitting: Speech Pathology

## 2023-04-04 DIAGNOSIS — R4189 Other symptoms and signs involving cognitive functions and awareness: Secondary | ICD-10-CM | POA: Diagnosis present

## 2023-04-04 DIAGNOSIS — R41841 Cognitive communication deficit: Secondary | ICD-10-CM | POA: Insufficient documentation

## 2023-04-04 NOTE — Therapy (Signed)
OUTPATIENT SPEECH LANGUAGE PATHOLOGY  COGNITION EVALUATION ONLY   Patient Name: Kara Pennington MRN: [id number] DOB:1940/12/26, 82 y.o., female Today's Date: 04/04/2023  PCP: Susette Racer, MD REFERRING PROVIDER: Levora Angel, MD   End of Session - 04/04/23 1143     Visit Number 1    SLP Start Time 1100    SLP Stop Time  1143    SLP Time Calculation (min) 43 min    Activity Tolerance Patient tolerated treatment well             Past Medical History:  Diagnosis Date   Anxiety    Depression    GERD (gastroesophageal reflux disease)    Hypertension    Past Surgical History:  Procedure Laterality Date   APPENDECTOMY     PARATHYROIDECTOMY     Patient Active Problem List   Diagnosis Date Noted   TIA (transient ischemic attack) 02/03/2020   Aphasia    Acute bronchitis 10/06/2016    ONSET DATE: 03/29/2023   REFERRING DIAG: R41.89 (ICD-10-CM) - Other symptoms and signs involving cognitive functions and awareness  THERAPY DIAG:  Cognitive communication deficit  Other symptoms and signs involving cognitive functions and awareness  Rationale for Evaluation and Treatment Rehabilitation  SUBJECTIVE:   SUBJECTIVE STATEMENT: Pt pleasant, good historian, complimentary of previous Outpatient ST services Pt accompanied by: self  PERTINENT HISTORY: Pt is a 82 year old right hand female who resides at an assisted living [facility]). She has past medical history of fall on 07/15/2020 head injury (resolved at this time) who voiced some concerns regarding word finding and possible memory issues during her recent visit with MD. Pt presents for evaluation of such.    DIAGNOSTIC FINDINGS:  07/11/2022 - MRI 1. No acute finding. 2. Extensive chronic small vessel ischemia  PAIN:  Are you having pain? No   FALLS: Has patient fallen in last 6 months?  No  LIVING ENVIRONMENT: Lives with: lives in an assisted living facility Lives in: House/apartment  PLOF:   Level of assistance: Independent with ADLs, Independent with IADLs Employment: Retired   PATIENT GOALS   to have assessment for any deficits that might not be age related in nature  OBJECTIVE:   COGNITIVE COMMUNICATION Overall cognitive status: Within functional limits for tasks assessed  AUDITORY COMPREHENSION  Overall auditory comprehension: Appears intact YES/NO questions: Appears intact Following directions: Appears intact Conversation: Complex  READING COMPREHENSION: Intact  EXPRESSION: verbal  VERBAL EXPRESSION:   Overall verbal expression: Appears intact Level of generative/spontaneous verbalization: conversation Automatic speech: name: intact and social response: intact  Repetition: Appears intact Naming: Responsive: 76-100%, Confrontation: 76-100%, Convergent: 76-100%, and Divergent: 76-100% Pragmatics: Appears intact Non-verbal means of communication: N/A  WRITTEN EXPRESSION: Dominant hand: right Written expression: Appears intact  ORAL MOTOR EXAMINATION Facial : WFL Lingual: WFL Velum: WFL Mandible: WFL Cough: WFL Voice: WFL  MOTOR SPEECH: Overall motor speech: Appears intact Respiration: diaphragmatic/abdominal breathing Phonation: normal Resonance: WFL Articulation: Appears intact Intelligibility: Intelligible Motor planning: Appears intact   STANDARDIZED ASSESSMENTS: Addenbrooke's Cognitive Examination - ACE III The Addenbrooke's Cognitive Examination-III (ACE-III) is a brief cognitive test that assesses five cognitive domains. The total score is 100 with higher scores indicating better cognitive functioning. Cut off scores of 88 and 82 are recommended for suspicion of dementia (88 has sensitivity of 1.00 and specificity of 0.96, 82 has sensitivity of 0.93 and specificity of 1.00). American Version B  Attention 18/18  Memory 26/26  Fluency 11/14  Language 25/26  Visuospatial 16/16  TOTAL ACE- III Score 96/100       PATIENT  EDUCATION: Education details: results of this assessment, importance of hearing aid use to prevent cognitive communication decline Person educated: Patient Education method: Explanation Education comprehension: verbalized understanding    ASSESSMENT:  CLINICAL IMPRESSION: Patient is a 82 y.o. right handed female who was seen today for a cognitive communication evaluation d/t concern for possible word finding and/or memory deficits. At this time, pt's abilities are considered to be within the normal range with no evidence of word finding difficulty within complex conversation. Pt is also able to recall strategies learned within prior ST services to increased communication and prevent communication breakdowns. All education was completed and all questions were answered to pt's satisfaction. No further services are indicated at this time. Marland Kitchen    PLAN: Evaluation only, no further services are indicated  Noe Pittsley B. Dreama Saa, M.S., CCC-SLP, Tree surgeon Certified Brain Injury Specialist [facility]  [facility] Office [phone] Ascom [phone] Fax [phone]

## 2023-04-09 ENCOUNTER — Encounter: Payer: Medicare Other | Admitting: Speech Pathology

## 2023-04-11 ENCOUNTER — Encounter: Payer: Medicare Other | Admitting: Speech Pathology

## 2023-04-16 ENCOUNTER — Encounter: Payer: Medicare Other | Admitting: Speech Pathology

## 2023-04-18 ENCOUNTER — Encounter: Payer: Medicare Other | Admitting: Speech Pathology

## 2023-04-23 ENCOUNTER — Ambulatory Visit: Payer: Medicare Other | Admitting: Speech Pathology

## 2023-04-25 ENCOUNTER — Ambulatory Visit: Payer: Medicare Other | Admitting: Speech Pathology

## 2023-04-30 ENCOUNTER — Encounter: Payer: Medicare Other | Admitting: Speech Pathology

## 2023-05-02 ENCOUNTER — Encounter: Payer: Medicare Other | Admitting: Speech Pathology

## 2023-05-07 ENCOUNTER — Encounter: Payer: Medicare Other | Admitting: Speech Pathology

## 2023-05-09 ENCOUNTER — Encounter: Payer: Medicare Other | Admitting: Speech Pathology

## 2023-05-14 ENCOUNTER — Encounter: Payer: Medicare Other | Admitting: Speech Pathology

## 2023-05-16 ENCOUNTER — Encounter: Payer: Medicare Other | Admitting: Speech Pathology

## 2023-05-21 ENCOUNTER — Encounter: Payer: Medicare Other | Admitting: Speech Pathology

## 2023-05-23 ENCOUNTER — Encounter: Payer: Medicare Other | Admitting: Speech Pathology

## 2023-05-28 ENCOUNTER — Encounter: Payer: Medicare Other | Admitting: Speech Pathology

## 2023-07-01 ENCOUNTER — Encounter: Payer: Self-pay | Admitting: Ophthalmology

## 2023-07-04 ENCOUNTER — Encounter: Payer: Self-pay | Admitting: Ophthalmology

## 2023-07-04 NOTE — Anesthesia Preprocedure Evaluation (Addendum)
Anesthesia Evaluation    Airway        Dental   Pulmonary           Cardiovascular hypertension,      Neuro/Psych    GI/Hepatic   Endo/Other    Renal/GU      Musculoskeletal   Abdominal   Peds  Hematology   Anesthesia Other Findings Anxiety  Depression Hypertension  GERD (gastroesophageal reflux disease) Complication of anesthesia----patient does NOT have a "complication of anesthesia;" she is"concerned about general anesthesia" since she had TBI; plan today is MAC Hx COVID-19 Chronic cough SOB (shortness of breath) TBI (traumatic brain injury)   Uses walker Wears hearing aid in both ears  DVT (deep venous thrombosis)  Vertigo SAH (subarachnoid hemorrhage  07-15-20    Reproductive/Obstetrics                             Anesthesia Physical Anesthesia Plan  ASA: 3  Anesthesia Plan: MAC   Post-op Pain Management:    Induction: Intravenous  PONV Risk Score and Plan:   Airway Management Planned: Natural Airway and Nasal Cannula  Additional Equipment:   Intra-op Plan:   Post-operative Plan:   Informed Consent: I have reviewed the patients History and Physical, chart, labs and discussed the procedure including the risks, benefits and alternatives for the proposed anesthesia with the patient or authorized representative who has indicated his/her understanding and acceptance.     Dental Advisory Given  Plan Discussed with: Anesthesiologist, CRNA and Surgeon  Anesthesia Plan Comments: (Patient consented for risks of anesthesia including but not limited to:  - adverse reactions to medications - damage to eyes, teeth, lips or other oral mucosa - nerve damage due to positioning  - sore throat or hoarseness - Damage to heart, brain, nerves, lungs, other parts of body or loss of life  Patient voiced understanding and assent.)       Anesthesia Quick Evaluation

## 2023-07-15 NOTE — Discharge Instructions (Signed)

## 2023-08-14 ENCOUNTER — Encounter
Admission: RE | Disposition: A | Discharge status: Home / Self Care | Payer: Self-pay | Source: Home / Self Care | Attending: Ophthalmology

## 2023-08-14 ENCOUNTER — Encounter: Payer: Self-pay | Admitting: Ophthalmology

## 2023-08-14 ENCOUNTER — Ambulatory Visit: Payer: Medicare Other | Admitting: Anesthesiology

## 2023-08-14 ENCOUNTER — Ambulatory Visit
Admission: RE | Admit: 2023-08-14 | Discharge: 2023-08-14 | Disposition: A | Discharge status: Home / Self Care | Payer: Medicare Other | Attending: Ophthalmology | Admitting: Ophthalmology

## 2023-08-14 ENCOUNTER — Other Ambulatory Visit: Payer: Self-pay

## 2023-08-14 DIAGNOSIS — Z86718 Personal history of other venous thrombosis and embolism: Secondary | ICD-10-CM | POA: Diagnosis not present

## 2023-08-14 DIAGNOSIS — K219 Gastro-esophageal reflux disease without esophagitis: Secondary | ICD-10-CM | POA: Diagnosis not present

## 2023-08-14 DIAGNOSIS — N1832 Chronic kidney disease, stage 3b: Secondary | ICD-10-CM | POA: Diagnosis not present

## 2023-08-14 DIAGNOSIS — H2512 Age-related nuclear cataract, left eye: Secondary | ICD-10-CM | POA: Insufficient documentation

## 2023-08-14 DIAGNOSIS — I129 Hypertensive chronic kidney disease with stage 1 through stage 4 chronic kidney disease, or unspecified chronic kidney disease: Secondary | ICD-10-CM | POA: Insufficient documentation

## 2023-08-14 HISTORY — PX: CATARACT EXTRACTION W/PHACO: SHX586

## 2023-08-14 HISTORY — DX: COVID-19: U07.1

## 2023-08-14 HISTORY — DX: Presence of external hearing-aid: Z97.4

## 2023-08-14 HISTORY — DX: Dependence on other enabling machines and devices: Z99.89

## 2023-08-14 HISTORY — DX: Chronic kidney disease, stage 3b: N18.32

## 2023-08-14 HISTORY — DX: Acute embolism and thrombosis of unspecified deep veins of unspecified lower extremity: I82.409

## 2023-08-14 HISTORY — DX: Other complications of anesthesia, initial encounter: T88.59XA

## 2023-08-14 HISTORY — DX: Chronic cough: R05.3

## 2023-08-14 HISTORY — DX: Shortness of breath: R06.02

## 2023-08-14 SURGERY — PHACOEMULSIFICATION, CATARACT, WITH IOL INSERTION
Anesthesia: Monitor Anesthesia Care | Laterality: Left

## 2023-08-14 MED ORDER — FENTANYL CITRATE (PF) 100 MCG/2ML IJ SOLN
INTRAMUSCULAR | Status: AC
  Filled 2023-08-14: qty 2

## 2023-08-14 MED ORDER — MOXIFLOXACIN HCL 0.5 % OP SOLN
OPHTHALMIC | Status: DC | PRN
  Administered 2023-08-14: 1 [drp] via OPHTHALMIC

## 2023-08-14 MED ORDER — SIGHTPATH DOSE#1 BSS IO SOLN
INTRAOCULAR | Status: DC | PRN
  Administered 2023-08-14: 15 mL via INTRAOCULAR

## 2023-08-14 MED ORDER — MIDAZOLAM HCL 2 MG/2ML IJ SOLN
INTRAMUSCULAR | Status: AC
  Filled 2023-08-14: qty 2

## 2023-08-14 MED ORDER — ARMC OPHTHALMIC DILATING DROPS
1.0000 | OPHTHALMIC | Status: DC | PRN
  Administered 2023-08-14 (×3): 1 via OPHTHALMIC

## 2023-08-14 MED ORDER — SIGHTPATH DOSE#1 NA HYALUR & NA CHOND-NA HYALUR IO KIT
PACK | INTRAOCULAR | Status: DC | PRN
  Administered 2023-08-14: 1 via OPHTHALMIC

## 2023-08-14 MED ORDER — SIGHTPATH DOSE#1 BSS IO SOLN
INTRAOCULAR | Status: DC | PRN
  Administered 2023-08-14: 62 mL via OPHTHALMIC

## 2023-08-14 MED ORDER — MIDAZOLAM HCL 2 MG/2ML IJ SOLN
INTRAMUSCULAR | Status: DC | PRN
  Administered 2023-08-14: 1 mg via INTRAVENOUS

## 2023-08-14 MED ORDER — BRIMONIDINE TARTRATE-TIMOLOL 0.2-0.5 % OP SOLN
OPHTHALMIC | Status: DC | PRN
  Administered 2023-08-14: 1 [drp] via OPHTHALMIC

## 2023-08-14 MED ORDER — SIGHTPATH DOSE#1 BSS IO SOLN
INTRAOCULAR | Status: DC | PRN
  Administered 2023-08-14: 2 mL

## 2023-08-14 MED ORDER — TETRACAINE HCL 0.5 % OP SOLN
OPHTHALMIC | Status: AC
  Filled 2023-08-14: qty 4

## 2023-08-14 MED ORDER — FENTANYL CITRATE (PF) 100 MCG/2ML IJ SOLN
INTRAMUSCULAR | Status: DC | PRN
  Administered 2023-08-14: 50 ug via INTRAVENOUS

## 2023-08-14 MED ORDER — ARMC OPHTHALMIC DILATING DROPS
OPHTHALMIC | Status: AC
  Filled 2023-08-14: qty 0.5

## 2023-08-14 MED ORDER — TETRACAINE HCL 0.5 % OP SOLN
1.0000 [drp] | OPHTHALMIC | Status: DC | PRN
  Administered 2023-08-14 (×3): 1 [drp] via OPHTHALMIC

## 2023-08-14 SURGICAL SUPPLY — 9 items
CATARACT SUITE SIGHTPATH (MISCELLANEOUS) ×1
FEE CATARACT SUITE SIGHTPATH (MISCELLANEOUS) ×1 IMPLANT
GLOVE SRG 8 PF TXTR STRL LF DI (GLOVE) ×1 IMPLANT
GLOVE SURG ENC TEXT LTX SZ7.5 (GLOVE) ×1 IMPLANT
LENS IOL TECNIS EYHANCE 21.0 (Intraocular Lens) IMPLANT
NDL FILTER BLUNT 18X1 1/2 (NEEDLE) ×1 IMPLANT
NEEDLE FILTER BLUNT 18X1 1/2 (NEEDLE) ×1
RING MALYGIN 7.0 (MISCELLANEOUS) IMPLANT
SYR 3ML LL SCALE MARK (SYRINGE) ×1 IMPLANT

## 2023-08-14 NOTE — H&P (Signed)
 [facility]   Primary Care Physician:  [facility] Ophthalmologist: Dr. Dene Etienne  Pre-Procedure History & Physical: HPI:  Kara Pennington is a 83 y.o. female here for ophthalmic surgery.   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 Authorizing Provider  Cholecalciferol (VITAMIN D3 SUPER STRENGTH) 50 MCG (2000 UT) TABS Take by mouth daily.   Yes [provider]  losartan  (COZAAR ) 25 MG tablet Take 25 mg by mouth daily.   Yes [provider]  mirtazapine (REMERON) 15 MG tablet Take 30 tablets by mouth at bedtime. 01/13/20 07/01/23 Yes [provider]  traZODone (DESYREL) 50 MG tablet Take 100 mg by mouth at bedtime. 02/19/19  Yes [provider]  pantoprazole  (PROTONIX ) 40 MG tablet Take 40 mg by mouth at bedtime.  Patient not taking: Reported on 07/01/2023    [provider]    Allergies as of 05/29/2023 - Review Complete 07/10/2022  Allergen Reaction Noted   Amoxicillin -pot clavulanate Hives and Rash 10/10/2016   Celecoxib Other (See Comments) and Rash 08/01/2019    History reviewed. No pertinent family history.  Social History    Socioeconomic History   Marital status: Divorced    Spouse name: Not on file   Number of children: Not on file   Years of education: Not on file   Highest education level: Not on file  Occupational History   Not on file  Tobacco Use   Smoking status: Never   Smokeless tobacco: Never  Vaping Use   Vaping status: Never Used  Substance and Sexual Activity   Alcohol use: Not Currently    Comment: rarely   Drug use: No   Sexual activity: Not on file  Other Topics Concern   Not on file  Social History Narrative   Not on file   Social Drivers of Health   Financial Resource Strain: Not on file  Food Insecurity: No Food Insecurity (03/28/2023)   Received from [facility]   Hunger Vital Sign    Worried About Running Out of Food in the Last Year: Never true    Ran Out of Food in the Last Year: Never true  Transportation Needs: No Transportation Needs (10/10/2021)   [facility]   PRAPARE - Transportation    Lack of Transportation (Medical): No    Lack of Transportation (Non-Medical): No  Physical Activity: Not on file  Stress: Not on file  Social Connections: Unknown (11/19/2021)   [facility]   Social Network    Social Network: Not on file  Intimate Partner Violence: Unknown (10/11/2021)   Received from [facility], [facility]   HITS    Physically Hurt: Not on file    Insult or Talk Down To: Not on file    Threaten Physical Harm: Not on file    Scream or Curse: Not on file    Review of Systems: See HPI, otherwise negative ROS  Physical Exam: BP (!) 144/87   Pulse 72   Temp (!) 97.3 F (36.3 C) (Temporal)   Ht 5' 1 (1.549 m)   Wt 75.3 kg   SpO2 98%   BMI 31.37 kg/m  General:   Alert,  pleasant and cooperative in NAD Head:  Normocephalic and atraumatic. Lungs:  Clear to auscultation.    Heart:  Regular rate and rhythm.   Impression/Plan: Kara Pennington is here for ophthalmic  surgery.  Risks, benefits, limitations, and alternatives regarding ophthalmic surgery have been reviewed with the patient.  Questions have been answered.  All parties agreeable.   Kara GASKIN, MD  08/14/2023, 8:57 AM

## 2023-08-14 NOTE — Op Note (Signed)
 OPERATIVE NOTE  Kara Pennington [id number] 08/14/2023  PREOPERATIVE DIAGNOSIS:   Nuclear sclerotic cataract left eye with miotic pupil      H25.12   POSTOPERATIVE DIAGNOSIS:   Nuclear sclerotic cataract left eye with miotic pupil.     PROCEDURE:  Phacoemulsification with posterior chamber intraocular lens implantation of the left eye which required pupil stretching with the Malyugin pupil expansion device  Ultrasound time: Procedure(s): CATARACT EXTRACTION PHACO AND INTRAOCULAR LENS PLACEMENT (IOC) LEFT 8.59 00:39.7 (Left)  LENS:   Implant Name Type Inv. Item Serial No. Manufacturer Lot No. LRB No. Used Action  LENS IOL TECNIS EYHANCE 21.0 - [id number] Intraocular Lens LENS IOL TECNIS EYHANCE 21.0 [id number] [employer]  Left 1 Implanted       SURGEON:  Dene FABIENE Etienne, MD   ANESTHESIA: Topical with tetracaine  drops and 2% Xylocaine  jelly, augmented with 1% preservative-free intracameral lidocaine .   COMPLICATIONS:  None.   DESCRIPTION OF PROCEDURE:  The patient was identified in the holding room and transported to the operating room and placed in the supine position under the operating microscope.  The left eye was identified as the operative eye and it was prepped and draped in the usual sterile ophthalmic fashion.   A 1 millimeter clear-corneal paracentesis was made at the 1:30 position.  The anterior chamber was filled with Viscoat viscoelastic.  0.5 ml of preservative-free 1% lidocaine  was injected into the anterior chamber.  A 2.4 millimeter keratome was used to make a near-clear corneal incision at the 10:30 position.  A Malyugin pupil expander was then placed through the main incision and into the anterior chamber of the eye.  The edge of the iris was secured on the lip of the pupil expander and it was released, thereby expanding the pupil to approximately 7 millimeters for completion of the cataract surgery.  Additional Viscoat was placed in the anterior chamber.  A cystotome and  capsulorrhexis forceps were used to make a curvilinear capsulorrhexis.   Balanced salt solution was used to hydrodissect and hydrodelineate the lens nucleus.   Phacoemulsification was used in stop and chop fashion to remove the lens, nucleus and epinucleus.  The remaining cortex was aspirated using the irrigation aspiration handpiece.  Additional Provisc was placed into the eye to distend the capsular bag for lens placement.  A lens was then injected into the capsular bag.  The pupil expanding ring was removed using a Kuglen hook and insertion device. The remaining viscoelastic was aspirated from the capsular bag and the anterior chamber.  The anterior chamber was filled with balanced salt solution to inflate to a physiologic pressure.   Wounds were hydrated with balanced salt solution.  The anterior chamber was inflated to a physiologic pressure with balanced salt solution.  No wound leaks were noted. Vigamox  0.2 ml of a 1mg  per ml solution was injected into the anterior chamber for a dose of 0.2 mg of intracameral antibiotic at the completion of the case.   Timolol  and Brimonidine  drops were applied to the eye.  The patient was taken to the recovery room in stable condition without complications of anesthesia or surgery.  [name] 08/14/2023, 9:41 AM

## 2023-08-14 NOTE — Anesthesia Postprocedure Evaluation (Signed)
 Anesthesia Post Note  Patient: Kara Pennington  Procedure(s) Performed: CATARACT EXTRACTION PHACO AND INTRAOCULAR LENS PLACEMENT (IOC) LEFT 8.59 00:39.7 (Left)  Patient location during evaluation: PACU Anesthesia Type: MAC Level of consciousness: awake and alert Pain management: pain level controlled Vital Signs Assessment: post-procedure vital signs reviewed and stable Respiratory status: spontaneous breathing, nonlabored ventilation, respiratory function stable and patient connected to nasal cannula oxygen Cardiovascular status: stable and blood pressure returned to baseline Postop Assessment: no apparent nausea or vomiting Anesthetic complications: no   No notable events documented.   Last Vitals:  Vitals:   08/14/23 0942 08/14/23 0945  BP: (!) 143/67 (!) 141/64  Pulse: 62 67  Resp: (!) 9 18  Temp: (!) 36.4 C   SpO2: 98% 97%    Last Pain:  Vitals:   08/14/23 0945  TempSrc:   PainSc: 0-No pain                 Shyane Fossum C Gayna Braddy

## 2023-08-14 NOTE — Transfer of Care (Signed)
 Immediate Anesthesia Transfer of Care Note  Patient: Kara Pennington  Procedure(s) Performed: CATARACT EXTRACTION PHACO AND INTRAOCULAR LENS PLACEMENT (IOC) LEFT 8.59 00:39.7 (Left)  Patient Location: PACU  Anesthesia Type: MAC  Level of Consciousness: awake, alert  and patient cooperative  Airway and Oxygen Therapy: Patient Spontanous Breathing and Patient connected to supplemental oxygen  Post-op Assessment: Post-op Vital signs reviewed, Patient's Cardiovascular Status Stable, Respiratory Function Stable, Patent Airway and No signs of Nausea or vomiting  Post-op Vital Signs: Reviewed and stable  Complications: No notable events documented.

## 2023-08-15 ENCOUNTER — Encounter: Payer: Self-pay | Admitting: Ophthalmology

## 2023-08-21 NOTE — Anesthesia Preprocedure Evaluation (Addendum)
 Anesthesia Evaluation  Patient identified by MRN, date of birth, ID band Patient awake    Reviewed: Allergy & Precautions, H&P , NPO status , Patient's Chart, lab work & pertinent test results  Airway Mallampati: II  TM Distance: <3 FB Neck ROM: Full    Dental no notable dental hx. (+) Caps Caps right upper central incisor and several to the right of that on upper :   Pulmonary neg pulmonary ROS   Pulmonary exam normal breath sounds clear to auscultation       Cardiovascular hypertension, negative cardio ROS Normal cardiovascular exam Rhythm:Regular Rate:Normal     Neuro/Psych negative neurological ROS  negative psych ROS   GI/Hepatic negative GI ROS, Neg liver ROS,,,  Endo/Other  negative endocrine ROS    Renal/GU negative Renal ROS  negative genitourinary   Musculoskeletal negative musculoskeletal ROS (+)    Abdominal   Peds negative pediatric ROS (+)  Hematology negative hematology ROS (+)   Anesthesia Other Findings Previous cataract surgery 08-14-23 Anxiety  Depression Hypertension GERD (gastroesophageal reflux disease) Complication of anesthesia  COVID-19 Chronic cough SOB (shortness of breath) TBI (traumatic brain injury) (HCC)  Uses walker Wears hearing aid in both ears  DVT (deep venous thrombosis) (HCC) Vertigo SAH (subarachnoid hemorrhage)  Chronic renal failure, stage 3b (HCC)     Reproductive/Obstetrics negative OB ROS                              Anesthesia Physical Anesthesia Plan  ASA: 3  Anesthesia Plan: MAC   Post-op Pain Management:    Induction: Intravenous  PONV Risk Score and Plan:   Airway Management Planned: Natural Airway and Nasal Cannula  Additional Equipment:   Intra-op Plan:   Post-operative Plan:   Informed Consent: I have reviewed the patients History and Physical, chart, labs and discussed the procedure including the risks,  benefits and alternatives for the proposed anesthesia with the patient or authorized representative who has indicated his/her understanding and acceptance.     Dental Advisory Given  Plan Discussed with: Anesthesiologist, CRNA and Surgeon  Anesthesia Plan Comments: (Patient consented for risks of anesthesia including but not limited to:  - adverse reactions to medications - damage to eyes, teeth, lips or other oral mucosa - nerve damage due to positioning  - sore throat or hoarseness - Damage to heart, brain, nerves, lungs, other parts of body or loss of life  Patient voiced understanding and assent.)         Anesthesia Quick Evaluation

## 2023-09-05 NOTE — Discharge Instructions (Signed)

## 2023-09-09 ENCOUNTER — Ambulatory Visit
Admission: RE | Admit: 2023-09-09 | Discharge: 2023-09-09 | Disposition: A | Discharge status: Home / Self Care | Payer: Medicare Other | Attending: Ophthalmology | Admitting: Ophthalmology

## 2023-09-09 ENCOUNTER — Encounter
Admission: RE | Disposition: A | Discharge status: Home / Self Care | Payer: Self-pay | Source: Home / Self Care | Attending: Ophthalmology

## 2023-09-09 ENCOUNTER — Ambulatory Visit: Payer: Self-pay | Admitting: Anesthesiology

## 2023-09-09 ENCOUNTER — Other Ambulatory Visit: Payer: Self-pay

## 2023-09-09 ENCOUNTER — Encounter: Payer: Self-pay | Admitting: Ophthalmology

## 2023-09-09 DIAGNOSIS — H2511 Age-related nuclear cataract, right eye: Secondary | ICD-10-CM | POA: Diagnosis present

## 2023-09-09 DIAGNOSIS — I129 Hypertensive chronic kidney disease with stage 1 through stage 4 chronic kidney disease, or unspecified chronic kidney disease: Secondary | ICD-10-CM | POA: Insufficient documentation

## 2023-09-09 DIAGNOSIS — N1832 Chronic kidney disease, stage 3b: Secondary | ICD-10-CM | POA: Insufficient documentation

## 2023-09-09 HISTORY — PX: CATARACT EXTRACTION W/PHACO: SHX586

## 2023-09-09 SURGERY — PHACOEMULSIFICATION, CATARACT, WITH IOL INSERTION
Anesthesia: Monitor Anesthesia Care | Site: Eye | Laterality: Right

## 2023-09-09 MED ORDER — TETRACAINE HCL 0.5 % OP SOLN
1.0000 [drp] | OPHTHALMIC | Status: DC | PRN
  Administered 2023-09-09 (×3): 1 [drp] via OPHTHALMIC

## 2023-09-09 MED ORDER — SIGHTPATH DOSE#1 BSS IO SOLN
INTRAOCULAR | Status: DC | PRN
  Administered 2023-09-09: 15 mL via INTRAOCULAR

## 2023-09-09 MED ORDER — MOXIFLOXACIN HCL 0.5 % OP SOLN
OPHTHALMIC | Status: DC | PRN
  Administered 2023-09-09: .2 mL via OPHTHALMIC

## 2023-09-09 MED ORDER — MIDAZOLAM HCL 2 MG/2ML IJ SOLN
INTRAMUSCULAR | Status: AC
  Filled 2023-09-09: qty 2

## 2023-09-09 MED ORDER — TETRACAINE HCL 0.5 % OP SOLN
OPHTHALMIC | Status: AC
  Filled 2023-09-09: qty 4

## 2023-09-09 MED ORDER — MIDAZOLAM HCL 2 MG/2ML IJ SOLN
INTRAMUSCULAR | Status: DC | PRN
  Administered 2023-09-09: 1 mg via INTRAVENOUS

## 2023-09-09 MED ORDER — BRIMONIDINE TARTRATE-TIMOLOL 0.2-0.5 % OP SOLN
OPHTHALMIC | Status: DC | PRN
  Administered 2023-09-09: 1 [drp] via OPHTHALMIC

## 2023-09-09 MED ORDER — SIGHTPATH DOSE#1 NA HYALUR & NA CHOND-NA HYALUR IO KIT
PACK | INTRAOCULAR | Status: DC | PRN
Start: 2023-09-09 — End: 2023-09-09
  Administered 2023-09-09: 1 via OPHTHALMIC

## 2023-09-09 MED ORDER — ARMC OPHTHALMIC DILATING DROPS
1.0000 | OPHTHALMIC | Status: DC | PRN
  Administered 2023-09-09 (×3): 1 via OPHTHALMIC

## 2023-09-09 MED ORDER — SIGHTPATH DOSE#1 BSS IO SOLN
INTRAOCULAR | Status: DC | PRN
  Administered 2023-09-09: 2 mL

## 2023-09-09 MED ORDER — ARMC OPHTHALMIC DILATING DROPS
OPHTHALMIC | Status: AC
  Filled 2023-09-09: qty 0.5

## 2023-09-09 MED ORDER — EPINEPHRINE PF 1 MG/ML IJ SOLN
INTRAMUSCULAR | Status: DC | PRN
  Administered 2023-09-09: 69 mL via OPHTHALMIC

## 2023-09-09 SURGICAL SUPPLY — 11 items
CATARACT SUITE SIGHTPATH (MISCELLANEOUS) ×1 IMPLANT
FEE CATARACT SUITE SIGHTPATH (MISCELLANEOUS) ×1 IMPLANT
GLOVE BIOGEL PI IND STRL 8 (GLOVE) ×1 IMPLANT
GLOVE SURG LX STRL 7.5 STRW (GLOVE) ×1 IMPLANT
GLOVE SURG PROTEXIS BL SZ6.5 (GLOVE) ×1 IMPLANT
GLOVE SURG SYN 6.5 PF PI BL (GLOVE) ×1 IMPLANT
LENS IOL TECNIS EYHANCE 21.0 (Intraocular Lens) IMPLANT
NDL FILTER BLUNT 18X1 1/2 (NEEDLE) ×1 IMPLANT
NEEDLE FILTER BLUNT 18X1 1/2 (NEEDLE) ×1 IMPLANT
RING MALYGIN 7.0 (MISCELLANEOUS) IMPLANT
SYR 3ML LL SCALE MARK (SYRINGE) ×1 IMPLANT

## 2023-09-09 NOTE — Op Note (Signed)
 OPERATIVE NOTE  Kara Pennington [id number] 09/09/2023   PREOPERATIVE DIAGNOSIS:    Nuclear Sclerotic Cataract Right eye with miotic pupil.        H25.11  POSTOPERATIVE DIAGNOSIS: Nuclear Sclerotic Cataract Right eye with miotic pupil.          PROCEDURE:  Phacoemusification with posterior chamber intraocular lens placement of the right eye which required pupil stretching with the Malyugin pupil expansion device. Ultrasound time: Procedure(s): CATARACT EXTRACTION PHACO AND INTRAOCULAR LENS PLACEMENT (IOC) RIGHT 7.76 00:39.8 (Right)  LENS:   Implant Name Type Inv. Item Serial No. Manufacturer Lot No. LRB No. Used Action  LENS IOL TECNIS EYHANCE 21.0 - [id number] Intraocular Lens LENS IOL TECNIS EYHANCE 21.0 [id number] [employer]  Right 1 Implanted      SURGEON:  Deirdre Evener, MD   ANESTHESIA:  Topical with tetracaine drops and 2% Xylocaine jelly, augmented with 1% preservative-free intracameral lidocaine.   COMPLICATIONS:  None.   DESCRIPTION OF PROCEDURE:  The patient was identified in the holding room and transported to the operating room and placed in the supine position under the operating microscope. The right eye was identified as the operative eye and it was prepped and draped in the usual sterile ophthalmic fashion.   A 1 millimeter clear-corneal paracentesis was made at the 12:00 position.  0.5 ml of preservative-free 1% lidocaine was injected into the anterior chamber. The anterior chamber was filled with Viscoat viscoelastic.  A 2.4 millimeter keratome was used to make a near-clear corneal incision at the 9:00 position. A Malyugin pupil expander was then placed through the main incision and into the anterior chamber of the eye.  The edge of the iris was secured on the lip of the pupil expander and it was released, thereby expanding the pupil to approximately 7 millimeters for completion of the cataract surgery.  Additional Viscoat was placed in the anterior chamber.  A  cystotome and capsulorrhexis forceps were used to make a curvilinear capsulorrhexis.   Balanced salt solution was used to hydrodissect and hydrodelineate the lens nucleus.   Phacoemulsification was used in stop and chop fashion to remove the lens, nucleus and epinucleus.  The remaining cortex was aspirated using the irrigation aspiration handpiece.  Additional Provisc was placed into the eye to distend the capsular bag for lens placement.  A lens was then injected into the capsular bag.  The pupil expanding ring was removed using a Kuglen hook and insertion device. The remaining viscoelastic was aspirated from the capsular bag and the anterior chamber.  The anterior chamber was filled with balanced salt solution to inflate to a physiologic pressure.  Wounds were hydrated with balanced salt solution.  The anterior chamber was inflated to a physiologic pressure with balanced salt solution.  No wound leaks were noted.Vigamox 0.2 ml of a 1mg  per ml solution was injected into the anterior chamber for a dose of 0.2 mg of intracameral antibiotic at the completion of the case. Timolol and Brimonidine drops were applied to the eye.  The patient was taken to the recovery room in stable condition without complications of anesthesia or surgery.  [name] 09/09/2023, 9:00 AM

## 2023-09-09 NOTE — Anesthesia Postprocedure Evaluation (Signed)
 Anesthesia Post Note  Patient: Kara Pennington  Procedure(s) Performed: CATARACT EXTRACTION PHACO AND INTRAOCULAR LENS PLACEMENT (IOC) RIGHT 7.76 00:39.8 (Right: Eye)  Patient location during evaluation: PACU Anesthesia Type: MAC Level of consciousness: awake and alert Pain management: pain level controlled Vital Signs Assessment: post-procedure vital signs reviewed and stable Respiratory status: spontaneous breathing, nonlabored ventilation, respiratory function stable and patient connected to nasal cannula oxygen Cardiovascular status: stable and blood pressure returned to baseline Postop Assessment: no apparent nausea or vomiting Anesthetic complications: no   No notable events documented.   Last Vitals:  Vitals:   09/09/23 0901 09/09/23 0905  BP: (!) 148/65 (!) 142/68  Pulse: 65 66  Resp: 13 12  Temp: (!) 36.2 C (!) 36.2 C  SpO2: 98% 98%    Last Pain:  Vitals:   09/09/23 0905  TempSrc:   PainSc: 0-No pain                 Silver Parkey C Sharren Schnurr

## 2023-09-09 NOTE — Transfer of Care (Signed)
 Immediate Anesthesia Transfer of Care Note  Patient: Kara Pennington  Procedure(s) Performed: CATARACT EXTRACTION PHACO AND INTRAOCULAR LENS PLACEMENT (IOC) RIGHT 7.76 00:39.8 (Right: Eye)  Patient Location: PACU  Anesthesia Type: MAC  Level of Consciousness: awake, alert  and patient cooperative  Airway and Oxygen Therapy: Patient Spontanous Breathing and Patient connected to supplemental oxygen  Post-op Assessment: Post-op Vital signs reviewed, Patient's Cardiovascular Status Stable, Respiratory Function Stable, Patent Airway and No signs of Nausea or vomiting  Post-op Vital Signs: Reviewed and stable  Complications: No notable events documented.

## 2023-09-09 NOTE — H&P (Signed)
 [facility]   Primary Care Physician:  [facility] Ophthalmologist: Dr. Lockie Mola  Pre-Procedure History & Physical: HPI:  Kara Pennington is a 83 y.o. female here for ophthalmic surgery.   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 Authorizing Provider  Cholecalciferol (VITAMIN D3 SUPER STRENGTH) 50 MCG (2000 UT) TABS Take by mouth daily.   Yes [provider]  losartan (COZAAR) 25 MG tablet Take 25 mg by mouth daily.   Yes [provider]  pantoprazole (PROTONIX) 40 MG tablet Take 40 mg by mouth at bedtime.   Yes [provider]  traZODone (DESYREL) 50 MG tablet Take 100 mg by mouth at bedtime. 02/19/19  Yes [provider]  mirtazapine (REMERON) 15 MG tablet Take 30 tablets by mouth at bedtime. 01/13/20 07/01/23  [provider]    Allergies as of 05/29/2023 - Review Complete 07/10/2022   Allergen Reaction Noted   Amoxicillin-pot clavulanate Hives and Rash 10/10/2016   Celecoxib Other (See Comments) and Rash 08/01/2019    History reviewed. No pertinent family history.  Social History   Socioeconomic History   Marital status: Divorced    Spouse name: Not on file   Number of children: Not on file   Years of education: Not on file   Highest education level: Not on file  Occupational History   Not on file  Tobacco Use   Smoking status: Never   Smokeless tobacco: Never  Vaping Use   Vaping status: Never Used  Substance and Sexual Activity   Alcohol use: Not Currently    Comment: rarely   Drug use: No   Sexual activity: Not on file  Other Topics Concern   Not on file  Social History Narrative   Not on file   Social Drivers of Health   Financial Resource Strain: Not on file  Food Insecurity: No Food Insecurity (03/28/2023)   Received from [facility]   Hunger Vital Sign    Worried About Running Out of Food in the Last Year: Never true    Ran Out of Food in the Last Year: Never true  Transportation Needs: No Transportation Needs (10/10/2021)   [facility], [facility]   [facility] - [facility]    Lack of Transportation (Medical):  No    Lack of Transportation (Non-Medical): No  Physical Activity: Not on file  Stress: Not on file  Social Connections: Unknown (11/19/2021)   [facility] from [facility], [facility]   Social Network    Social Network: Not on file  Intimate Partner Violence: Unknown (10/11/2021)   Received from [facility], [facility]   HITS    Physically Hurt: Not on file    Insult or Talk Down To: Not on file    Threaten Physical Harm: Not on file    Scream or Curse: Not on file    Review of Systems: See HPI, otherwise negative ROS  Physical Exam: BP (!) 159/90   Pulse 79   Temp (!) 97.5 F (36.4 C) (Temporal)   Resp 20   Ht 5\' 1"  (1.549 m)   Wt 75.3 kg   SpO2 98%   BMI 31.37 kg/m  General:    Alert,  pleasant and cooperative in NAD Head:  Normocephalic and atraumatic. Lungs:  Clear to auscultation.    Heart:  Regular rate and rhythm.   Impression/Plan: Kara Pennington is here for ophthalmic surgery.  Risks, benefits, limitations, and alternatives regarding ophthalmic surgery have been reviewed with the patient.  Questions have been answered.  All parties agreeable.   Lockie Mola, MD  09/09/2023, 8:22 AM

## 2023-09-10 ENCOUNTER — Encounter: Payer: Self-pay | Admitting: Ophthalmology
